# Patient Record
Sex: Female | Born: 1960 | Race: White | Hispanic: No | Marital: Married | Smoking: Current every day smoker
Health system: Southern US, Community
[De-identification: ages and names within clinical notes are randomized; demographics above are authoritative.]

## PROBLEM LIST (undated history)

## (undated) DIAGNOSIS — K219 Gastro-esophageal reflux disease without esophagitis: Secondary | ICD-10-CM

## (undated) DIAGNOSIS — R112 Nausea with vomiting, unspecified: Secondary | ICD-10-CM

## (undated) DIAGNOSIS — J439 Emphysema, unspecified: Secondary | ICD-10-CM

## (undated) DIAGNOSIS — M1990 Unspecified osteoarthritis, unspecified site: Secondary | ICD-10-CM

## (undated) DIAGNOSIS — K7460 Unspecified cirrhosis of liver: Secondary | ICD-10-CM

## (undated) DIAGNOSIS — G4730 Sleep apnea, unspecified: Secondary | ICD-10-CM

## (undated) DIAGNOSIS — K760 Fatty (change of) liver, not elsewhere classified: Secondary | ICD-10-CM

## (undated) DIAGNOSIS — J302 Other seasonal allergic rhinitis: Secondary | ICD-10-CM

## (undated) DIAGNOSIS — M4800 Spinal stenosis, site unspecified: Secondary | ICD-10-CM

## (undated) DIAGNOSIS — Z87442 Personal history of urinary calculi: Secondary | ICD-10-CM

## (undated) DIAGNOSIS — N9089 Other specified noninflammatory disorders of vulva and perineum: Secondary | ICD-10-CM

## (undated) DIAGNOSIS — K635 Polyp of colon: Secondary | ICD-10-CM

## (undated) DIAGNOSIS — M47812 Spondylosis without myelopathy or radiculopathy, cervical region: Secondary | ICD-10-CM

## (undated) DIAGNOSIS — R161 Splenomegaly, not elsewhere classified: Secondary | ICD-10-CM

## (undated) DIAGNOSIS — D696 Thrombocytopenia, unspecified: Secondary | ICD-10-CM

## (undated) DIAGNOSIS — F329 Major depressive disorder, single episode, unspecified: Secondary | ICD-10-CM

## (undated) DIAGNOSIS — Z9889 Other specified postprocedural states: Secondary | ICD-10-CM

## (undated) DIAGNOSIS — F419 Anxiety disorder, unspecified: Secondary | ICD-10-CM

## (undated) DIAGNOSIS — I2510 Atherosclerotic heart disease of native coronary artery without angina pectoris: Secondary | ICD-10-CM

## (undated) DIAGNOSIS — F32A Depression, unspecified: Secondary | ICD-10-CM

## (undated) HISTORY — PX: VULVAR LESION REMOVAL: SHX5391

## (undated) HISTORY — DX: Other seasonal allergic rhinitis: J30.2

## (undated) HISTORY — DX: Emphysema, unspecified: J43.9

## (undated) HISTORY — DX: Polyp of colon: K63.5

## (undated) HISTORY — DX: Thrombocytopenia, unspecified: D69.6

## (undated) HISTORY — DX: Unspecified cirrhosis of liver: K74.60

## (undated) HISTORY — DX: Sleep apnea, unspecified: G47.30

## (undated) HISTORY — DX: Splenomegaly, not elsewhere classified: R16.1

## (undated) HISTORY — DX: Atherosclerotic heart disease of native coronary artery without angina pectoris: I25.10

## (undated) HISTORY — DX: Depression, unspecified: F32.A

## (undated) HISTORY — DX: Spinal stenosis, site unspecified: M48.00

## (undated) HISTORY — DX: Unspecified osteoarthritis, unspecified site: M19.90

## (undated) HISTORY — DX: Major depressive disorder, single episode, unspecified: F32.9

## (undated) HISTORY — PX: SIMPLE VULVECTOMY: SUR1442

## (undated) HISTORY — PX: LAPAROSCOPIC CHOLECYSTECTOMY: SUR755

## (undated) HISTORY — DX: Gastro-esophageal reflux disease without esophagitis: K21.9

## (undated) HISTORY — PX: ABDOMINAL HYSTERECTOMY: SHX81

## (undated) HISTORY — DX: Fatty (change of) liver, not elsewhere classified: K76.0

## (undated) HISTORY — DX: Anxiety disorder, unspecified: F41.9

---

## 1989-04-21 HISTORY — PX: VAGINAL HYSTERECTOMY: SHX2639

## 1998-07-10 ENCOUNTER — Ambulatory Visit (HOSPITAL_BASED_OUTPATIENT_CLINIC_OR_DEPARTMENT_OTHER): Admission: RE | Admit: 1998-07-10 | Discharge: 1998-07-10 | Payer: Self-pay | Admitting: Orthopedic Surgery

## 1999-04-22 HISTORY — PX: FOOT SURGERY: SHX648

## 2007-04-22 HISTORY — PX: LEG SURGERY: SHX1003

## 2010-04-21 DIAGNOSIS — K635 Polyp of colon: Secondary | ICD-10-CM

## 2010-04-21 HISTORY — DX: Polyp of colon: K63.5

## 2010-04-21 HISTORY — PX: CHOLECYSTECTOMY: SHX55

## 2010-06-06 ENCOUNTER — Ambulatory Visit (INDEPENDENT_AMBULATORY_CARE_PROVIDER_SITE_OTHER): Payer: PRIVATE HEALTH INSURANCE | Admitting: Internal Medicine

## 2010-06-06 DIAGNOSIS — K5289 Other specified noninfective gastroenteritis and colitis: Secondary | ICD-10-CM

## 2011-02-10 ENCOUNTER — Telehealth (INDEPENDENT_AMBULATORY_CARE_PROVIDER_SITE_OTHER): Payer: Self-pay | Admitting: *Deleted

## 2011-02-10 MED ORDER — PANTOPRAZOLE SODIUM 40 MG PO TBEC: 40.0000 mg | DELAYED_RELEASE_TABLET | Freq: Every day | ORAL | Status: DC

## 2011-02-10 NOTE — Telephone Encounter (Signed)
Patient wants a written RX for her Protonix sent to [facility] pharmacy  Have any questions [phone] est [phone]

## 2011-09-20 HISTORY — PX: COLONOSCOPY W/ POLYPECTOMY: SHX1380

## 2013-03-21 HISTORY — PX: ESOPHAGOGASTRODUODENOSCOPY: SHX1529

## 2014-07-03 ENCOUNTER — Encounter (INDEPENDENT_AMBULATORY_CARE_PROVIDER_SITE_OTHER): Payer: Self-pay

## 2014-07-03 ENCOUNTER — Ambulatory Visit (INDEPENDENT_AMBULATORY_CARE_PROVIDER_SITE_OTHER): Payer: Self-pay | Admitting: Pulmonary Disease

## 2014-07-03 ENCOUNTER — Ambulatory Visit (INDEPENDENT_AMBULATORY_CARE_PROVIDER_SITE_OTHER)
Admission: RE | Admit: 2014-07-03 | Discharge: 2014-07-03 | Disposition: A | Discharge status: Home / Self Care | Payer: Self-pay | Source: Ambulatory Visit | Attending: Pulmonary Disease | Admitting: Pulmonary Disease

## 2014-07-03 ENCOUNTER — Encounter: Payer: Self-pay | Admitting: Pulmonary Disease

## 2014-07-03 VITALS — BP 98/64 | HR 88 | Temp 97.9°F | Ht 66.0 in | Wt 180.0 lb

## 2014-07-03 DIAGNOSIS — R05 Cough: Secondary | ICD-10-CM

## 2014-07-03 DIAGNOSIS — J432 Centrilobular emphysema: Secondary | ICD-10-CM

## 2014-07-03 DIAGNOSIS — Z720 Tobacco use: Secondary | ICD-10-CM

## 2014-07-03 DIAGNOSIS — R053 Chronic cough: Secondary | ICD-10-CM

## 2014-07-03 DIAGNOSIS — R058 Other specified cough: Secondary | ICD-10-CM

## 2014-07-03 MED ORDER — FLUTICASONE FUROATE-VILANTEROL 100-25 MCG/INH IN AEPB: 1.0000 | INHALATION_SPRAY | Freq: Every day | RESPIRATORY_TRACT | Status: DC

## 2014-07-03 MED ORDER — CHLORPHENIRAMINE MALEATE 4 MG PO TABS: 4.0000 mg | ORAL_TABLET | Freq: Every day | ORAL | Status: DC

## 2014-07-03 MED ORDER — UMECLIDINIUM BROMIDE 62.5 MCG/INH IN AEPB: 1.0000 | INHALATION_SPRAY | Freq: Every day | RESPIRATORY_TRACT | Status: DC

## 2014-07-03 NOTE — Patient Instructions (Signed)
Chest xray today Will schedule breathing test (PFT) Incruse one puff daily Use saline nasal spray daily Flonase two sprays each nostril daily Chlorpheneramine 4 mg nightly for allergies Sip water when you have urge to cough Salt water gargle twice per day Continue breo and singulair Follow up in 8 weeks

## 2014-07-03 NOTE — Progress Notes (Signed)
Chief Complaint  Patient presents with  . PULMONARY COINSULT    Referred by Dr Woody Seller for COPD. PFT in [place], [place] 2014/2015.     History of Present Illness: Destiny Villanueva is a 54 y.o. female former smoker evaluation of chronic cough with near syncope.  She has extensive hx of smoking.  She smoked 1 ppd and quit 2 weeks ago >> she has been using nicorette gum.  She has hx of allergies, and was seen by Dr. Donneta Romberg.  She was told she is allergic to just about everything.  She was never on allergy shots.  She is allergic to cats >> she has a International aid/development worker.  There is no hx of TB or PNA.    She used to work as a Scientist, water quality at [employer].  She had PFT's done about a year ago and was told she had COPD.  She was seen by Dr. Koleen Nimrod with pulmonary 2 yrs ago for lung nodule >> told it was benign.  Previous CT chest reports changes of emphysema.  She gets coughing spells >> usually non productive.  She will sometimes bring up clear to yellow sputum.  She denies hemoptysis or wheezing.  She gets sinus congestion, sniffles, post nasal drip, occasional sore throat, and globus sensation.  She has flonase, but has not been using it.  She takes nexium daily >> no improvement with cough.  She has been using Breo >> this keeps her lungs open longer than symbicort, but makes he cough more.  She has been using singulair for about a year.  She has ventolin >> helps some.  She will sometimes feel like she is going to pass out if she coughs too much.  Tests: CT chest 07/08/11 >> mild centrilobular emphysema, 2 mm calcified RUL nodule no change from 2010, hepatic steatosis  Destiny Villanueva  has a past medical history of Emphysema/COPD; Seasonal allergies; and Sleep apnea.  Destiny Villanueva  has past surgical history that includes Vaginal hysterectomy (1991); Cholecystectomy (2012); Foot surgery (Left, 2001); and Leg Surgery (Left, 2009).  Prior to Admission medications   Medication Sig Start Date End Date  Taking? Authorizing Provider  pantoprazole (PROTONIX) 40 MG tablet Take 1 tablet (40 mg total) by mouth daily. 02/10/11 02/10/12  Butch Penny, NP    Allergies  Allergen Reactions  . Aspirin     Stomach pains, nausea and vomiting  . Ciprofloxacin     Blisters on tongue  . Penicillins     Hives    Her family history includes Asthma in her brother and sister; Breast cancer in her cousin and maternal aunt; Emphysema in her mother and sister; Heart disease in her maternal grandmother; Lung cancer in her mother.  She  reports that she quit smoking about 2 weeks ago. Her smoking use included Cigarettes. She has a 30 pack-year smoking history. She does not have any smokeless tobacco history on file. She reports that she does not drink alcohol or use illicit drugs.  Review of Systems  Constitutional: Negative for fever and unexpected weight change.  HENT: Positive for postnasal drip and sore throat. Negative for congestion, dental problem, ear pain, nosebleeds, rhinorrhea, sinus pressure, sneezing and trouble swallowing.   Eyes: Negative for redness and itching.  Respiratory: Positive for cough and shortness of breath. Negative for chest tightness and wheezing.   Cardiovascular: Negative for palpitations and leg swelling.  Gastrointestinal: Negative for nausea and vomiting.       Acid heartburn //  Indigestion  Genitourinary: Negative for dysuria.  Musculoskeletal: Positive for arthralgias. Negative for joint swelling.  Skin: Negative for rash.  Neurological: Negative for headaches.  Hematological: Does not bruise/bleed easily.  Psychiatric/Behavioral: Positive for dysphoric mood. The patient is nervous/anxious.    Physical Exam: Blood pressure 98/64, pulse 88, temperature 97.9 F (36.6 C), temperature source Oral, height 5\' 6"  (1.676 m), weight 180 lb (81.647 kg), SpO2 97 %. Body mass index is 29.07 kg/(m^2).  General - No distress ENT - No sinus tenderness, no oral exudate, no LAN,  no thyromegaly, TM clear, pupils equal/reactive, edentulous Cardiac - s1s2 regular, no murmur, pulses symmetric Chest - No wheeze/rales/dullness, good air entry, normal respiratory excursion Back - No focal tenderness Abd - Soft, non-tender, no organomegaly, + bowel sounds Ext - No edema Neuro - Normal strength, cranial nerves intact Skin - No rashes Psych - Normal mood, and behavior  Discussion: She has chronic cough with near syncope.  She has hx of allergic rhinitis with post-nasal drip, COPD with emphysema, and GERD.  I suspect her persistent cough is related to all these issues.   Assessment/Plan:  Upper airway cough syndrome with allergic rhinitis. Plan: - will have her use nasal irrigation and flonase - will have her use chlorpheniramine nightly - continue singulair - advise her to avoid forcing a cough, sip water when she has urge to cough, and use salt water gargles - she might need additional allergy testing  COPD with emphysema. Plan: - will repeat chest xray and PFT - will add incruse one puff daily - she can continue breo, singulair, and prn ventolin   GERD. Plan: - continue nexium per her PCP  Tobacco abuse. Plan: - encourage her to continue with her smoking cessation   Chesley Mires, MD [place] [facility]/Critical Care/Sleep Pager:  [phone]

## 2014-07-03 NOTE — Progress Notes (Deleted)
   Subjective:    Patient ID: Destiny Villanueva, female    DOB: Dec 06, 1960, 54 y.o.   MRN: [id number]  HPI    Review of Systems  Constitutional: Negative for fever and unexpected weight change.  HENT: Positive for postnasal drip and sore throat. Negative for congestion, dental problem, ear pain, nosebleeds, rhinorrhea, sinus pressure, sneezing and trouble swallowing.   Eyes: Negative for redness and itching.  Respiratory: Positive for cough and shortness of breath. Negative for chest tightness and wheezing.   Cardiovascular: Negative for palpitations and leg swelling.  Gastrointestinal: Negative for nausea and vomiting.       Acid heartburn // Indigestion  Genitourinary: Negative for dysuria.  Musculoskeletal: Positive for arthralgias. Negative for joint swelling.  Skin: Negative for rash.  Neurological: Negative for headaches.  Hematological: Does not bruise/bleed easily.  Psychiatric/Behavioral: Positive for dysphoric mood. The patient is nervous/anxious.        Objective:   Physical Exam        Assessment & Plan:

## 2014-07-03 NOTE — Addendum Note (Signed)
Addended by: Virl Cagey on: 07/03/2014 05:05 PM   Modules accepted: Orders

## 2014-07-04 ENCOUNTER — Telehealth: Payer: Self-pay | Admitting: Pulmonary Disease

## 2014-07-04 NOTE — Telephone Encounter (Signed)
Dg Chest 2 View  07/03/2014   CLINICAL DATA:  Chronic cough and congestion for 1 year  EXAM: CHEST  2 VIEW  COMPARISON:  None.  FINDINGS: Cardiac shadow is within normal limits. Platelike scarring is noted in the left lung base laterally. No focal infiltrate or sizable effusion is seen. No acute bony abnormality is noted.  IMPRESSION: No active cardiopulmonary disease.   Electronically Signed   By: Inez Catalina M.D.   On: 07/03/2014 15:13    Will have my nurse inform pt that CXR was normal.  No change to current treatment plan.

## 2014-07-05 NOTE — Telephone Encounter (Signed)
lmtcb x1 

## 2014-07-06 NOTE — Telephone Encounter (Signed)
Pt informed of cxr results per Dr Halford Chessman

## 2014-08-28 ENCOUNTER — Encounter: Payer: Self-pay | Admitting: Pulmonary Disease

## 2014-08-28 ENCOUNTER — Ambulatory Visit (INDEPENDENT_AMBULATORY_CARE_PROVIDER_SITE_OTHER): Payer: Self-pay | Admitting: Pulmonary Disease

## 2014-08-28 VITALS — BP 104/68 | HR 74 | Ht 66.0 in | Wt 182.0 lb

## 2014-08-28 DIAGNOSIS — J432 Centrilobular emphysema: Secondary | ICD-10-CM

## 2014-08-28 DIAGNOSIS — Z720 Tobacco use: Secondary | ICD-10-CM

## 2014-08-28 DIAGNOSIS — R053 Chronic cough: Secondary | ICD-10-CM

## 2014-08-28 DIAGNOSIS — R05 Cough: Secondary | ICD-10-CM

## 2014-08-28 DIAGNOSIS — R058 Other specified cough: Secondary | ICD-10-CM

## 2014-08-28 LAB — PULMONARY FUNCTION TEST
DL/VA % pred: 77 %
DL/VA: 3.89 ml/min/mmHg/L
DLCO UNC % PRED: 78 %
DLCO unc: 21.06 ml/min/mmHg
FEF 25-75 PRE: 1.78 L/s
FEF 25-75 Post: 2.21 L/sec
FEF2575-%Change-Post: 24 %
FEF2575-%PRED-POST: 81 %
FEF2575-%Pred-Pre: 65 %
FEV1-%CHANGE-POST: 5 %
FEV1-%PRED-POST: 86 %
FEV1-%Pred-Pre: 81 %
FEV1-Post: 2.52 L
FEV1-Pre: 2.37 L
FEV1FVC-%Change-Post: 8 %
FEV1FVC-%PRED-PRE: 92 %
FEV6-%Change-Post: -1 %
FEV6-%PRED-POST: 88 %
FEV6-%Pred-Pre: 89 %
FEV6-PRE: 3.22 L
FEV6-Post: 3.18 L
FEV6FVC-%Change-Post: 1 %
FEV6FVC-%PRED-PRE: 101 %
FEV6FVC-%Pred-Post: 103 %
FVC-%Change-Post: -2 %
FVC-%PRED-PRE: 88 %
FVC-%Pred-Post: 85 %
FVC-POST: 3.18 L
FVC-Pre: 3.26 L
POST FEV6/FVC RATIO: 100 %
PRE FEV6/FVC RATIO: 99 %
Post FEV1/FVC ratio: 79 %
Pre FEV1/FVC ratio: 73 %
RV % PRED: 103 %
RV: 2.05 L
TLC % PRED: 101 %
TLC: 5.44 L

## 2014-08-28 MED ORDER — UMECLIDINIUM BROMIDE 62.5 MCG/INH IN AEPB: 1.0000 | INHALATION_SPRAY | Freq: Every day | RESPIRATORY_TRACT | Status: DC

## 2014-08-28 MED ORDER — DIPHENHYDRAMINE HCL 50 MG PO TABS: 50.0000 mg | ORAL_TABLET | Freq: Every day | ORAL | Status: DC

## 2014-08-28 NOTE — Patient Instructions (Signed)
Continue incruse one puff daily Continue flonase two sprays each nostril daily Try benadryl 50 mg nightly Follow up in 2 months

## 2014-08-28 NOTE — Progress Notes (Signed)
Chief Complaint  Patient presents with  . Follow-up    Pt c/o cough- no improvement. Pt states that she becomes very shakey when she coughs and this is getting worse over time. Review PFT today.     History of Present Illness: Destiny Villanueva is a 54 y.o. female former smoker with chronic cough and cough syncope.  She had CXR on 07/03/14 which was unremarkable.  She had PFT earlier today.  This showed mild diffusion defect, but otherwise normal.  Her cough persists.  She had trouble affording medications.  She is no longer taking singulair, nexium, or chlorpheniramine.    She has noticed getting tremors after using breo.  She does not feel this helps.  Her cough has not gotten any worse since stopping nexium.  She still has sinus congestion and post nasal drip.  She has been using flonase.  She does not usually cough up sputum.  She denies chest pain.   TESTS: CT chest 07/08/11 >> mild centrilobular emphysema, 2 mm calcified RUL nodule no change from 2010, hepatic steatosis PFT 08/28/14 >> FEV1 2.52 (86%), FEV1% 79, TLC 5.44 (101%), DLCO 78%, no BD  Past medical hx, Past surgical hx, Medications, Allergies, Family hx, Social hx all reviewed.   Physical Exam: Blood pressure 104/68, pulse 74, height 5\' 6"  (1.676 m), weight 182 lb (82.555 kg), SpO2 97 %. Body mass index is 29.39 kg/(m^2).  General - No distress ENT - No sinus tenderness, no oral exudate, no LAN Cardiac - s1s2 regular, no murmur Chest - No wheeze/rales/dullness Back - No focal tenderness Abd - Soft, non-tender Ext - No edema Neuro - Normal strength Skin - No rashes Psych - normal mood, and behavior   Assessment/Plan:  Upper airway cough syndrome with allergic rhinitis. She has trouble affording medications. Plan: - will have her continue flonase and nasal irrigation - she can try using benadryl 50 mg nightly - defer further testing for now due to her concern about expense  Emphysema. She has mild  diffusion defect, but no evidence for airflow obstruction.   Plan: - continue incruse - she likely has tremor from [facility] >> will have her stop this - if her symptoms persist, then she might need repeat CT chest >> defer this for now due to patient concern about expense of therapy  GERD. Plan: - monitor off nexium  Tobacco abuse. Plan: - encouraged her to continue with her smoking abstinence    Chesley Mires, MD [place] [facility]/Critical Care/Sleep Pager:  [phone]

## 2014-08-28 NOTE — Addendum Note (Signed)
Addended by: Virl Cagey on: 08/28/2014 04:23 PM   Modules accepted: Orders

## 2014-08-28 NOTE — Progress Notes (Signed)
PFT done today. 

## 2014-10-30 ENCOUNTER — Ambulatory Visit: Payer: Self-pay | Admitting: Pulmonary Disease

## 2014-12-06 ENCOUNTER — Encounter: Payer: Self-pay | Admitting: Pulmonary Disease

## 2014-12-06 ENCOUNTER — Ambulatory Visit (INDEPENDENT_AMBULATORY_CARE_PROVIDER_SITE_OTHER): Payer: Self-pay | Admitting: Pulmonary Disease

## 2014-12-06 VITALS — BP 108/58 | HR 92 | Ht 66.0 in | Wt 177.0 lb

## 2014-12-06 DIAGNOSIS — R058 Other specified cough: Secondary | ICD-10-CM

## 2014-12-06 DIAGNOSIS — R05 Cough: Secondary | ICD-10-CM

## 2014-12-06 DIAGNOSIS — J439 Emphysema, unspecified: Secondary | ICD-10-CM

## 2014-12-06 DIAGNOSIS — R053 Chronic cough: Secondary | ICD-10-CM

## 2014-12-06 MED ORDER — FLUTICASONE FUROATE-VILANTEROL 100-25 MCG/INH IN AEPB: 1.0000 | INHALATION_SPRAY | Freq: Every day | RESPIRATORY_TRACT | Status: DC

## 2014-12-06 MED ORDER — ALBUTEROL SULFATE (2.5 MG/3ML) 0.083% IN NEBU
2.5000 mg | INHALATION_SOLUTION | Freq: Four times a day (QID) | RESPIRATORY_TRACT | Status: DC | PRN
Start: 2014-12-06 — End: 2021-04-18

## 2014-12-06 NOTE — Patient Instructions (Signed)
Stop incruse Start Breo one puff daily >> rinse mouth after each use Can use albuterol by puffer or nebulizer every 4 to 6 hours as needed for cough, wheeze, or chest congestion Will try to arrange for home nebulizer machine  Follow up in 6 months

## 2014-12-06 NOTE — Progress Notes (Signed)
Chief Complaint  Patient presents with  . Follow-up    Pt reports that her syncope episodes with cough are worsening since last OV - no improvement. Still using Incruse.     History of Present Illness: Destiny Villanueva is a 54 y.o. female former smoker with chronic cough and cough syncope.  She continues to get coughing spells.  These happen randomly.  No specific inciting events.  She feels like her throat closes and her head spins.  She will then either almost pass out, or actually pass out.  She comes to quickly.   She has been using incruse.  She feels that breo opened her up more.  She is still having tremors even w/o using breo.  She is not using albuterol.  She has been using flonase and benadryl.  Her sinuses are okay.  She denies reflux.   TESTS: CT chest 07/08/11 >> mild centrilobular emphysema, 2 mm calcified RUL nodule no change from 2010, hepatic steatosis PFT 08/28/14 >> FEV1 2.52 (86%), FEV1% 79, TLC 5.44 (101%), DLCO 78%, no BD  Past medical hx >> Allergies, OSA  Past surgical hx, Medications, Allergies, Family hx, Social hx all reviewed.   Physical Exam: BP 108/58 mmHg  Pulse 92  Ht 5\' 6"  (1.676 m)  Wt 177 lb (80.287 kg)  BMI 28.58 kg/m2  SpO2 98%  General - No distress ENT - No sinus tenderness, no oral exudate, no LAN Cardiac - s1s2 regular, no murmur Chest - No wheeze/rales/dullness Back - No focal tenderness Abd - Soft, non-tender Ext - No edema Neuro - Normal strength Skin - No rashes Psych - normal mood, and behavior   Assessment/Plan:  Upper airway cough syndrome with allergic rhinitis. She has trouble affording medications. Plan: - will have her continue flonase and nasal irrigation - continue benadryl 50 mg nightly prn - defer further testing for now due to her concern about expense  Emphysema. She has mild diffusion defect, but no evidence for airflow obstruction.   Plan: - she felt breo worked better than incruse >> change back to  breo - continue prn albuterol >> will try to set up home nebulizer  GERD. Plan: - continue monitor off nexium   Destiny Mires, MD  Pulmonary/Critical Care/Sleep Pager:  [phone]

## 2015-01-29 ENCOUNTER — Telehealth: Payer: Self-pay | Admitting: Pulmonary Disease

## 2015-01-29 NOTE — Telephone Encounter (Signed)
Called and spoke with Tamela Oddi at [employer] stated they were processing pt's claim for disability and wanted to know if Dr Halford Chessman had placed any work limitations on pt Informed Doris that I would send message to Dr Halford Chessman to find out  Dr Halford Chessman, please advise if pt has any work limitations. Thank you

## 2015-01-30 NOTE — Telephone Encounter (Signed)
Dr. Sood, please advise. 

## 2015-01-30 NOTE — Telephone Encounter (Signed)
Spoke with Tamela Oddi at [facility], aware of below.  Nothing further needed.

## 2015-01-30 NOTE — Telephone Encounter (Signed)
I did not discuss any work restrictions during her last visit with me from 12/06/14.

## 2015-01-31 ENCOUNTER — Encounter: Payer: Self-pay | Admitting: Pulmonary Disease

## 2015-01-31 NOTE — Telephone Encounter (Signed)
  MyChart Message:  I am trying to get approved for my long term disability from my former employer and I need for Dr. Halford Chessman to return the paper work from them in reference to me not being able to work anymore due to my COPD with cough syncopy. They do not seem to understand exactly what this condition does to me physically almost every time I cough. I almost always black out when this happens and start shaking extremely bad. It takes me a while to recover from these episodes and I do have to go lay down until it passes. If he has any questions at all please have him or his nurse call me at home [phone]. The paperwork for my long term disability is from [place]. They have already gotten my medical records from your office.  Thank You, Destiny Villanueva

## 2015-02-01 NOTE — Telephone Encounter (Signed)
Dr. Halford Chessman, do you by chance have these documents?  Thank you.

## 2015-02-05 NOTE — Telephone Encounter (Addendum)
10.15.16 mychart message from pt: Message     Would you please send me a copy of the document in reference to me being unable to continue working? I need to forward it to my attorney. I do not need the medical records.    Thank You,    Destiny Villanueva    Nothing recent scanned into pt's chart Called and LM for Elmyra Ricks with [employer] to see if something recent had been sent regarding this for pt Email sent to patient informing her we are working on her message

## 2015-02-15 ENCOUNTER — Encounter: Payer: Self-pay | Admitting: Pulmonary Disease

## 2015-02-20 ENCOUNTER — Telehealth: Payer: Self-pay | Admitting: Pulmonary Disease

## 2015-02-20 NOTE — Telephone Encounter (Signed)
Received MyChart Message from patient requesting information about [employer] Disability paperwork.  Per Dr. Juanetta Gosling last [place] note, patient was not given any restrictions.  Paperwork was scanned into chart blank.  Printed out forms and asked Dr. Halford Chessman about the forms, he said to give them to him and he will review them again.  Papers left at Dr. Juanetta Gosling workstation for review.

## 2015-03-05 NOTE — Telephone Encounter (Signed)
I have completed forms

## 2015-03-05 NOTE — Telephone Encounter (Signed)
Forms faxed to [facility] along with copies of OV notes for patient's disability claim Patient notified via mychart Nothing further needed. Closing encounter

## 2015-03-27 ENCOUNTER — Telehealth: Payer: Self-pay | Admitting: Pulmonary Disease

## 2015-03-27 NOTE — Telephone Encounter (Signed)
lmtcb X1 at below extension. Forms have been received and are in VS' look-at

## 2015-03-27 NOTE — Telephone Encounter (Signed)
Returned call let her know that forms received and given to VS to look at, was glad they were received.Hillery Hunter

## 2015-04-18 ENCOUNTER — Encounter: Payer: Self-pay | Admitting: Physician Assistant

## 2015-04-18 ENCOUNTER — Ambulatory Visit: Payer: Self-pay | Admitting: Physician Assistant

## 2015-04-18 VITALS — BP 100/64 | HR 89 | Temp 97.9°F | Ht 65.5 in | Wt 164.6 lb

## 2015-04-18 DIAGNOSIS — F419 Anxiety disorder, unspecified: Secondary | ICD-10-CM | POA: Insufficient documentation

## 2015-04-18 DIAGNOSIS — F329 Major depressive disorder, single episode, unspecified: Secondary | ICD-10-CM | POA: Insufficient documentation

## 2015-04-18 DIAGNOSIS — F17219 Nicotine dependence, cigarettes, with unspecified nicotine-induced disorders: Secondary | ICD-10-CM | POA: Insufficient documentation

## 2015-04-18 DIAGNOSIS — G8929 Other chronic pain: Secondary | ICD-10-CM

## 2015-04-18 DIAGNOSIS — F32A Depression, unspecified: Secondary | ICD-10-CM | POA: Insufficient documentation

## 2015-04-18 DIAGNOSIS — E559 Vitamin D deficiency, unspecified: Secondary | ICD-10-CM

## 2015-04-18 DIAGNOSIS — J438 Other emphysema: Secondary | ICD-10-CM

## 2015-04-18 DIAGNOSIS — J449 Chronic obstructive pulmonary disease, unspecified: Secondary | ICD-10-CM | POA: Insufficient documentation

## 2015-04-18 DIAGNOSIS — Z1329 Encounter for screening for other suspected endocrine disorder: Secondary | ICD-10-CM

## 2015-04-18 DIAGNOSIS — Z13220 Encounter for screening for lipoid disorders: Secondary | ICD-10-CM

## 2015-04-18 DIAGNOSIS — Z131 Encounter for screening for diabetes mellitus: Secondary | ICD-10-CM

## 2015-04-18 LAB — CBC WITH DIFFERENTIAL/PLATELET
Basophils Absolute: 0 10*3/uL (ref 0.0–0.1)
Basophils Relative: 1 % (ref 0–1)
EOS PCT: 2 % (ref 0–5)
Eosinophils Absolute: 0.1 10*3/uL (ref 0.0–0.7)
HEMATOCRIT: 39.2 % (ref 36.0–46.0)
Hemoglobin: 13.2 g/dL (ref 12.0–15.0)
LYMPHS ABS: 1.7 10*3/uL (ref 0.7–4.0)
LYMPHS PCT: 35 % (ref 12–46)
MCH: 28.4 pg (ref 26.0–34.0)
MCHC: 33.7 g/dL (ref 30.0–36.0)
MCV: 84.3 fL (ref 78.0–100.0)
MONOS PCT: 9 % (ref 3–12)
MPV: 10.3 fL (ref 8.6–12.4)
Monocytes Absolute: 0.4 10*3/uL (ref 0.1–1.0)
Neutro Abs: 2.6 10*3/uL (ref 1.7–7.7)
Neutrophils Relative %: 53 % (ref 43–77)
Platelets: 127 10*3/uL — ABNORMAL LOW (ref 150–400)
RBC: 4.65 MIL/uL (ref 3.87–5.11)
RDW: 14 % (ref 11.5–15.5)
WBC: 4.9 10*3/uL (ref 4.0–10.5)

## 2015-04-18 LAB — LIPID PANEL
CHOL/HDL RATIO: 4.3 ratio (ref <=5.0)
Cholesterol: 125 mg/dL (ref 125–200)
HDL: 29 mg/dL — AB (ref >=46)
LDL Cholesterol: 65 mg/dL (ref <130)
Triglycerides: 153 mg/dL — ABNORMAL HIGH (ref <150)
VLDL: 31 mg/dL — ABNORMAL HIGH (ref <30)

## 2015-04-18 LAB — COMPLETE METABOLIC PANEL WITH GFR
ALK PHOS: 53 U/L (ref 33–130)
ALT: 22 U/L (ref 6–29)
AST: 35 U/L (ref 10–35)
Albumin: 3.9 g/dL (ref 3.6–5.1)
BUN: 12 mg/dL (ref 7–25)
CALCIUM: 9.1 mg/dL (ref 8.6–10.4)
CO2: 25 mmol/L (ref 20–31)
Chloride: 105 mmol/L (ref 98–110)
Creat: 0.69 mg/dL (ref 0.50–1.05)
GFR, Est Non African American: >89 mL/min (ref >=60)
Glucose, Bld: 89 mg/dL (ref 65–99)
POTASSIUM: 4.4 mmol/L (ref 3.5–5.3)
Sodium: 142 mmol/L (ref 135–146)
Total Bilirubin: 0.5 mg/dL (ref 0.2–1.2)
Total Protein: 6.4 g/dL (ref 6.1–8.1)

## 2015-04-18 LAB — HEMOGLOBIN A1C
HEMOGLOBIN A1C: 5.5 % (ref <5.7)
MEAN PLASMA GLUCOSE: 111 mg/dL (ref <117)

## 2015-04-18 LAB — TSH: TSH: 2.009 u[IU]/mL (ref 0.350–4.500)

## 2015-04-18 LAB — GLUCOSE, POCT (MANUAL RESULT ENTRY): POC GLUCOSE: 96 mg/dL (ref 70–99)

## 2015-04-18 MED ORDER — ALBUTEROL SULFATE HFA 108 (90 BASE) MCG/ACT IN AERS: 2.0000 | INHALATION_SPRAY | Freq: Four times a day (QID) | RESPIRATORY_TRACT | Status: AC | PRN

## 2015-04-18 MED ORDER — MOMETASONE FUROATE 50 MCG/ACT NA SUSP: NASAL | Status: DC

## 2015-04-18 NOTE — Patient Instructions (Signed)
Smoking Cessation, Tips for Success If you are ready to quit smoking, congratulations! You have chosen to help yourself be healthier. Cigarettes bring nicotine, tar, carbon monoxide, and other irritants into your body. Your lungs, heart, and blood vessels will be able to work better without these poisons. There are many different ways to quit smoking. Nicotine gum, nicotine patches, a nicotine inhaler, or nicotine nasal spray can help with physical craving. Hypnosis, support groups, and medicines help break the habit of smoking. WHAT THINGS CAN I DO TO MAKE QUITTING EASIER?  Here are some tips to help you quit for good:  Pick a date when you will quit smoking completely. Tell all of your friends and family about your plan to quit on that date.  Do not try to slowly cut down on the number of cigarettes you are smoking. Pick a quit date and quit smoking completely starting on that day.  Throw away all cigarettes.   Clean and remove all ashtrays from your home, work, and car.  On a card, write down your reasons for quitting. Carry the card with you and read it when you get the urge to smoke.  Cleanse your body of nicotine. Drink enough water and fluids to keep your urine clear or pale yellow. Do this after quitting to flush the nicotine from your body.  Learn to predict your moods. Do not let a bad situation be your excuse to have a cigarette. Some situations in your life might tempt you into wanting a cigarette.  Never have "just one" cigarette. It leads to wanting another and another. Remind yourself of your decision to quit.  Change habits associated with smoking. If you smoked while driving or when feeling stressed, try other activities to replace smoking. Stand up when drinking your coffee. Brush your teeth after eating. Sit in a different chair when you read the paper. Avoid alcohol while trying to quit, and try to drink fewer caffeinated beverages. Alcohol and caffeine may urge you to  smoke.  Avoid foods and drinks that can trigger a desire to smoke, such as sugary or spicy foods and alcohol.  Ask people who smoke not to smoke around you.  Have something planned to do right after eating or having a cup of coffee. For example, plan to take a walk or exercise.  Try a relaxation exercise to calm you down and decrease your stress. Remember, you may be tense and nervous for the first 2 weeks after you quit, but this will pass.  Find new activities to keep your hands busy. Play with a pen, coin, or rubber band. Doodle or draw things on paper.  Brush your teeth right after eating. This will help cut down on the craving for the taste of tobacco after meals. You can also try mouthwash.   Use oral substitutes in place of cigarettes. Try using lemon drops, carrots, cinnamon sticks, or chewing gum. Keep them handy so they are available when you have the urge to smoke.  When you have the urge to smoke, try deep breathing.  Designate your home as a nonsmoking area.  If you are a heavy smoker, ask your health care provider about a prescription for nicotine chewing gum. It can ease your withdrawal from nicotine.  Reward yourself. Set aside the cigarette money you save and buy yourself something nice.  Look for support from others. Join a support group or smoking cessation program. Ask someone at home or at work to help you with your plan   to quit smoking.  Always ask yourself, "Do I need this cigarette or is this just a reflex?" Tell yourself, "Today, I choose not to smoke," or "I do not want to smoke." You are reminding yourself of your decision to quit.  Do not replace cigarette smoking with electronic cigarettes (commonly called e-cigarettes). The safety of e-cigarettes is unknown, and some may contain harmful chemicals.  If you relapse, do not give up! Plan ahead and think about what you will do the next time you get the urge to smoke. HOW WILL I FEEL WHEN I QUIT SMOKING? You  may have symptoms of withdrawal because your body is used to nicotine (the addictive substance in cigarettes). You may crave cigarettes, be irritable, feel very hungry, cough often, get headaches, or have difficulty concentrating. The withdrawal symptoms are only temporary. They are strongest when you first quit but will go away within 10-14 days. When withdrawal symptoms occur, stay in control. Think about your reasons for quitting. Remind yourself that these are signs that your body is healing and getting used to being without cigarettes. Remember that withdrawal symptoms are easier to treat than the major diseases that smoking can cause.  Even after the withdrawal is over, expect periodic urges to smoke. However, these cravings are generally short lived and will go away whether you smoke or not. Do not smoke! WHAT RESOURCES ARE AVAILABLE TO HELP ME QUIT SMOKING? Your health care provider can direct you to community resources or hospitals for support, which may include:  Group support.  Education.  Hypnosis.  Therapy.   This information is not intended to replace advice given to you by your health care provider. Make sure you discuss any questions you have with your health care provider.   Document Released: 01/04/2004 Document Revised: 04/28/2014 Document Reviewed: 09/23/2012 Elsevier Interactive Patient Education 2016 [employer]  

## 2015-04-18 NOTE — Progress Notes (Signed)
BP 100/64 mmHg  Pulse 89  Temp(Src) 97.9 F (36.6 C)  Ht 5' 5.5" (1.664 m)  Wt 164 lb 9.6 oz (74.662 kg)  BMI 26.96 kg/m2  SpO2 99%   Subjective:    Patient ID: Destiny Villanueva, female    DOB: [birth year], 54 y.o.   MRN: [id number]  HPI: Destiny Villanueva is a 54 y.o. female presenting on 04/18/2015 for New Patient (Initial Visit)   HPI   Pt states she had insurance until February this year.  While insured, she was treated at [facility].  Pt says she has f/u appt with pulmonologist dr Halford Chessman for her emphysema.  She says they give her enough samples of her Memory [facility] that she doesn't have to buy it.    Pt says she started smoking again about a month ago.  She says the bad episodes of coughing with near syncope never stopped even for the time when she had stopped smoking earlier in the year.    Pt isn't taking her vitamin d and flonase b/c she can't afford them.    Pt was going to Dr Francesco Runner for chronic pain at [facility] pain clinic. She says she has enough of her baclofen and norco left from when she last saw him in February.  Pt says she was due for repeat colonscopy at age 7 but says she just forgot about it.  Dr [name] in [place] did her first procedure.  Pt has card to call for [facility] appointment.    Pt states had mammo 04/10/15 at [facility].  Relevant past medical, surgical, family and social history reviewed and updated as indicated. Interim medical history since our last visit reviewed. Allergies and medications reviewed and updated.  Current outpatient prescriptions:  .  albuterol (PROVENTIL HFA;VENTOLIN HFA) 108 (90 BASE) MCG/ACT inhaler, Inhale 2 puffs into the lungs every 6 (six) hours as needed for wheezing or shortness of breath., Disp: , Rfl:  .  albuterol (PROVENTIL) (2.5 MG/3ML) 0.083% nebulizer solution, Take 3 mLs (2.5 mg total) by nebulization every 6 (six) hours as needed for wheezing or shortness of breath., Disp: 75 mL, Rfl: 12 .  ALPRAZolam (XANAX) 0.5 MG tablet,  Take 0.5 mg by mouth at bedtime as needed for anxiety., Disp: , Rfl:  .  baclofen (LIORESAL) 10 MG tablet, Take 10 mg by mouth 3 (three) times daily as needed for muscle spasms., Disp: , Rfl:  .  diphenhydrAMINE (BENADRYL) 50 MG tablet, Take 1 tablet (50 mg total) by mouth at bedtime., Disp: , Rfl:  .  escitalopram (LEXAPRO) 20 MG tablet, Take 20 mg by mouth daily., Disp: , Rfl:  .  Fluticasone Furoate-Vilanterol (BREO ELLIPTA) 100-25 MCG/INH AEPB, Inhale 1 puff into the lungs daily., Disp: 1 each, Rfl: 5 .  HYDROcodone-acetaminophen (NORCO) 10-325 MG per tablet, Take 1 tablet by mouth as needed., Disp: , Rfl:  .  Cholecalciferol (VITAMIN D3) 50000 UNITS CAPS, Take 1 capsule by mouth once a week. Reported on 04/18/2015, Disp: , Rfl:  .  fluticasone (FLONASE) 50 MCG/ACT nasal spray, Place 2 sprays into both nostrils daily. Reported on 04/18/2015, Disp: , Rfl:   Review of Systems  Constitutional: Positive for chills, diaphoresis, fatigue and unexpected weight change. Negative for fever and appetite change.  HENT: Positive for congestion, sneezing, sore throat and voice change. Negative for dental problem, drooling, ear pain, facial swelling, hearing loss, mouth sores and trouble swallowing.   Eyes: Negative for pain, discharge, redness, itching and visual disturbance.  Respiratory: Positive for cough and shortness of breath. Negative for choking and wheezing.   Cardiovascular: Negative for chest pain, palpitations and leg swelling.  Gastrointestinal: Positive for abdominal pain, diarrhea and constipation. Negative for vomiting and blood in stool.  Endocrine: Negative for cold intolerance, heat intolerance and polydipsia.  Genitourinary: Negative for dysuria, hematuria and decreased urine volume.  Musculoskeletal: Positive for back pain, arthralgias and gait problem.  Skin: Negative for rash.  Allergic/Immunologic: Positive for environmental allergies.  Neurological: Positive for light-headedness  and headaches. Negative for seizures and syncope.  Hematological: Negative for adenopathy.  Psychiatric/Behavioral: Positive for dysphoric mood and agitation. Negative for suicidal ideas. The patient is nervous/anxious.     Per HPI unless specifically indicated above     Objective:    BP 100/64 mmHg  Pulse 89  Temp(Src) 97.9 F (36.6 C)  Ht 5' 5.5" (1.664 m)  Wt 164 lb 9.6 oz (74.662 kg)  BMI 26.96 kg/m2  SpO2 99%  Wt Readings from Last 3 Encounters:  04/18/15 164 lb 9.6 oz (74.662 kg)  12/06/14 177 lb (80.287 kg)  08/28/14 182 lb (82.555 kg)    Physical Exam  Constitutional: She is oriented to person, place, and time. She appears well-developed and well-nourished.  HENT:  Head: Normocephalic and atraumatic.  Mouth/Throat: Oropharynx is clear and moist. No oropharyngeal exudate.  edentulous  Eyes: Conjunctivae and EOM are normal. Pupils are equal, round, and reactive to light.  Neck: Neck supple. No thyromegaly present.  Cardiovascular: Normal rate and regular rhythm.   Pulmonary/Chest: Effort normal and breath sounds normal.  Abdominal: Soft. Bowel sounds are normal. She exhibits no distension and no mass. There is no hepatosplenomegaly. There is tenderness. There is no rebound, no guarding and no CVA tenderness.  Musculoskeletal: She exhibits no edema.  Lymphadenopathy:    She has no cervical adenopathy.  Neurological: She is alert and oriented to person, place, and time. Gait normal.  Skin: Skin is warm and dry.  Psychiatric: She has a normal mood and affect. Her behavior is normal.  Vitals reviewed.   Results for orders placed or performed in visit on 04/18/15  POCT Glucose (CBG)  Result Value Ref Range   POC Glucose 96 70 - 99 mg/dl      Assessment & Plan:   Encounter Diagnoses  Name Primary?  . Other emphysema ([place]) Yes  . Cigarette nicotine dependence with nicotine-induced disorder   . Chronic pain   . Anxiety   . Depression   . Vitamin D deficiency    . Screening for diabetes mellitus   . Screening cholesterol level   . Screening for thyroid disorder     -Pt to Call for [facility] appt -pt to Get baseline labs drawn -Records requested from Dr [name] about previous colonoscopy -Records of mammo 03/2015 requested from [facility] -Sign up for medassist- order albuterol mdi and nasonex -Counseled on smoking cessation -F/u OV 1 mo

## 2015-04-19 LAB — VITAMIN D 25 HYDROXY (VIT D DEFICIENCY, FRACTURES): VIT D 25 HYDROXY: 28 ng/mL — AB (ref 30–100)

## 2015-05-10 ENCOUNTER — Encounter: Payer: Self-pay | Admitting: Physician Assistant

## 2015-05-21 ENCOUNTER — Ambulatory Visit: Payer: Self-pay | Admitting: Physician Assistant

## 2015-05-23 ENCOUNTER — Encounter: Payer: Self-pay | Admitting: Physician Assistant

## 2015-06-06 ENCOUNTER — Ambulatory Visit: Payer: Self-pay | Admitting: Pulmonary Disease

## 2015-07-11 ENCOUNTER — Ambulatory Visit: Payer: Self-pay | Admitting: Physician Assistant

## 2015-07-11 ENCOUNTER — Encounter: Payer: Self-pay | Admitting: Physician Assistant

## 2015-07-11 VITALS — BP 102/60 | HR 74 | Temp 97.2°F | Ht 66.0 in | Wt 166.0 lb

## 2015-07-11 DIAGNOSIS — J449 Chronic obstructive pulmonary disease, unspecified: Secondary | ICD-10-CM

## 2015-07-11 DIAGNOSIS — E559 Vitamin D deficiency, unspecified: Secondary | ICD-10-CM

## 2015-07-11 DIAGNOSIS — G8929 Other chronic pain: Secondary | ICD-10-CM

## 2015-07-11 DIAGNOSIS — D696 Thrombocytopenia, unspecified: Secondary | ICD-10-CM

## 2015-07-11 DIAGNOSIS — F17219 Nicotine dependence, cigarettes, with unspecified nicotine-induced disorders: Secondary | ICD-10-CM

## 2015-07-11 DIAGNOSIS — F419 Anxiety disorder, unspecified: Secondary | ICD-10-CM

## 2015-07-11 DIAGNOSIS — F32A Depression, unspecified: Secondary | ICD-10-CM

## 2015-07-11 DIAGNOSIS — F329 Major depressive disorder, single episode, unspecified: Secondary | ICD-10-CM

## 2015-07-11 NOTE — Patient Instructions (Signed)
Start OTC vitamin d supplement CALL [facility]

## 2015-07-11 NOTE — Progress Notes (Signed)
BP 102/60 mmHg  Pulse 74  Temp(Src) 97.2 F (36.2 C)  Ht '5\' 6"'  (1.676 m)  Wt 166 lb (75.297 kg)  BMI 26.81 kg/m2  SpO2 98%   Subjective:    Patient ID: Destiny Villanueva, female    DOB: 1960-09-03, 55 y.o.   MRN: [id number]  HPI: Destiny Villanueva is a 55 y.o. female presenting on 07/11/2015 for Follow-up   HPI    -Pt has not contaced [facility] facility. Still having anxiety and depression.  No SI or HI  -counseled pt about need to come in for her appointments- she no-showed for her f/u in January and never rescheduled. She states understanding  -she is still smoking  Relevant past medical, surgical, family and social history reviewed and updated as indicated. Interim medical history since our last visit reviewed. Allergies and medications reviewed and updated.  Current outpatient prescriptions:  .  albuterol (PROVENTIL HFA;VENTOLIN HFA) 108 (90 Base) MCG/ACT inhaler, Inhale 2 puffs into the lungs every 6 (six) hours as needed for wheezing or shortness of breath., Disp: 3 Inhaler, Rfl: 0 .  albuterol (PROVENTIL) (2.5 MG/3ML) 0.083% nebulizer solution, Take 3 mLs (2.5 mg total) by nebulization every 6 (six) hours as needed for wheezing or shortness of breath., Disp: 75 mL, Rfl: 12 .  diphenhydrAMINE (BENADRYL) 50 MG tablet, Take 1 tablet (50 mg total) by mouth at bedtime., Disp: , Rfl:  .  escitalopram (LEXAPRO) 20 MG tablet, Take 20 mg by mouth daily., Disp: , Rfl:  .  Fluticasone Furoate-Vilanterol (BREO ELLIPTA) 100-25 MCG/INH AEPB, Inhale 1 puff into the lungs daily., Disp: 1 each, Rfl: 5   Review of Systems  Constitutional: Positive for fatigue. Negative for fever, chills, diaphoresis, appetite change and unexpected weight change.  HENT: Positive for congestion. Negative for dental problem, drooling, ear pain, facial swelling, hearing loss, mouth sores, sneezing, sore throat, trouble swallowing and voice change.   Eyes: Negative for pain, discharge, redness, itching and visual  disturbance.  Respiratory: Positive for cough and shortness of breath. Negative for choking and wheezing.   Cardiovascular: Negative for chest pain, palpitations and leg swelling.  Gastrointestinal: Positive for diarrhea and constipation. Negative for vomiting, abdominal pain and blood in stool.  Endocrine: Negative for cold intolerance, heat intolerance and polydipsia.  Genitourinary: Negative for dysuria, hematuria and decreased urine volume.  Musculoskeletal: Positive for back pain and arthralgias. Negative for gait problem.  Skin: Negative for rash.  Allergic/Immunologic: Positive for environmental allergies.  Neurological: Negative for seizures, syncope, light-headedness and headaches.  Hematological: Negative for adenopathy.  Psychiatric/Behavioral: Positive for dysphoric mood and agitation. Negative for suicidal ideas. The patient is nervous/anxious.     Per HPI unless specifically indicated above     Objective:    BP 102/60 mmHg  Pulse 74  Temp(Src) 97.2 F (36.2 C)  Ht '5\' 6"'  (1.676 m)  Wt 166 lb (75.297 kg)  BMI 26.81 kg/m2  SpO2 98%  Wt Readings from Last 3 Encounters:  07/11/15 166 lb (75.297 kg)  04/18/15 164 lb 9.6 oz (74.662 kg)  12/06/14 177 lb (80.287 kg)    Physical Exam  Constitutional: She is oriented to person, place, and time. She appears well-developed and well-nourished.  HENT:  Head: Normocephalic and atraumatic.  Neck: Neck supple.  Cardiovascular: Normal rate and regular rhythm.   Pulmonary/Chest: Effort normal and breath sounds normal.  Abdominal: Soft. Bowel sounds are normal. She exhibits no mass. There is no hepatosplenomegaly. There is no tenderness.  Musculoskeletal: She  exhibits no edema.  Lymphadenopathy:    She has no cervical adenopathy.  Neurological: She is alert and oriented to person, place, and time.  Skin: Skin is warm and dry.  Psychiatric: She has a normal mood and affect. Her behavior is normal.  Vitals reviewed.   Results  for orders placed or performed in visit on 04/18/15  Lipid Profile  Result Value Ref Range   Cholesterol 125 125 - 200 mg/dL   Triglycerides 153 (H) <150 mg/dL   HDL 29 (L) >=46 mg/dL   Total CHOL/HDL Ratio 4.3 <=5.0 Ratio   VLDL 31 (H) <30 mg/dL   LDL Cholesterol 65 <130 mg/dL  CBC w/Diff/Platelet  Result Value Ref Range   WBC 4.9 4.0 - 10.5 K/uL   RBC 4.65 3.87 - 5.11 MIL/uL   Hemoglobin 13.2 12.0 - 15.0 g/dL   HCT 39.2 36.0 - 46.0 %   MCV 84.3 78.0 - 100.0 fL   MCH 28.4 26.0 - 34.0 pg   MCHC 33.7 30.0 - 36.0 g/dL   RDW 14.0 11.5 - 15.5 %   Platelets 127 (L) 150 - 400 K/uL   MPV 10.3 8.6 - 12.4 fL   Neutrophils Relative % 53 43 - 77 %   Neutro Abs 2.6 1.7 - 7.7 K/uL   Lymphocytes Relative 35 12 - 46 %   Lymphs Abs 1.7 0.7 - 4.0 K/uL   Monocytes Relative 9 3 - 12 %   Monocytes Absolute 0.4 0.1 - 1.0 K/uL   Eosinophils Relative 2 0 - 5 %   Eosinophils Absolute 0.1 0.0 - 0.7 K/uL   Basophils Relative 1 0 - 1 %   Basophils Absolute 0.0 0.0 - 0.1 K/uL   Smear Review Criteria for review not met   COMPLETE METABOLIC PANEL WITH GFR  Result Value Ref Range   Sodium 142 135 - 146 mmol/L   Potassium 4.4 3.5 - 5.3 mmol/L   Chloride 105 98 - 110 mmol/L   CO2 25 20 - 31 mmol/L   Glucose, Bld 89 65 - 99 mg/dL   BUN 12 7 - 25 mg/dL   Creat 0.69 0.50 - 1.05 mg/dL   Total Bilirubin 0.5 0.2 - 1.2 mg/dL   Alkaline Phosphatase 53 33 - 130 U/L   AST 35 10 - 35 U/L   ALT 22 6 - 29 U/L   Total Protein 6.4 6.1 - 8.1 g/dL   Albumin 3.9 3.6 - 5.1 g/dL   Calcium 9.1 8.6 - 10.4 mg/dL   GFR, Est African American >89 >=60 mL/min   GFR, Est Non African American >89 >=60 mL/min  HgB A1c  Result Value Ref Range   Hgb A1c MFr Bld 5.5 <5.7 %   Mean Plasma Glucose 111 <117 mg/dL  TSH  Result Value Ref Range   TSH 2.009 0.350 - 4.500 uIU/mL  Vitamin D (25 hydroxy)  Result Value Ref Range   Vit D, 25-Hydroxy 28 (L) 30 - 100 ng/mL  POCT Glucose (CBG)  Result Value Ref Range   POC Glucose 96  70 - 99 mg/dl      Assessment & Plan:   Encounter Diagnoses  Name Primary?  . Thrombocytopenia ([place]) Yes  . Chronic obstructive pulmonary disease, unspecified COPD type ([place])   . Cigarette nicotine dependence with nicotine-induced disorder   . Anxiety   . Depression   . Chronic pain   . Vitamin D deficiency      -reviewed labs with pt  -Pt can take  otc vit d supp -Recheck platelets -Pt to Call [facility] -counseled pt on smoking cessation -F/u 3 months  -obtained colonoscopy report- done 09/24/11.  It recommends repeat in 3 year.  Will give pt [facility] discount application and refer to GI for repeat

## 2015-07-12 LAB — CBC WITH DIFFERENTIAL/PLATELET
BASOS ABS: 0 10*3/uL (ref 0.0–0.1)
Basophils Relative: 1 % (ref 0–1)
EOS PCT: 1 % (ref 0–5)
Eosinophils Absolute: 0 10*3/uL (ref 0.0–0.7)
HCT: 37.6 % (ref 36.0–46.0)
Hemoglobin: 12.6 g/dL (ref 12.0–15.0)
LYMPHS ABS: 1.8 10*3/uL (ref 0.7–4.0)
LYMPHS PCT: 42 % (ref 12–46)
MCH: 27.8 pg (ref 26.0–34.0)
MCHC: 33.5 g/dL (ref 30.0–36.0)
MCV: 82.8 fL (ref 78.0–100.0)
MONO ABS: 0.3 10*3/uL (ref 0.1–1.0)
MPV: 9.6 fL (ref 8.6–12.4)
Monocytes Relative: 7 % (ref 3–12)
Neutro Abs: 2.2 10*3/uL (ref 1.7–7.7)
Neutrophils Relative %: 49 % (ref 43–77)
PLATELETS: 122 10*3/uL — AB (ref 150–400)
RBC: 4.54 MIL/uL (ref 3.87–5.11)
RDW: 13.3 % (ref 11.5–15.5)
WBC: 4.4 10*3/uL (ref 4.0–10.5)

## 2015-07-13 ENCOUNTER — Other Ambulatory Visit: Payer: Self-pay | Admitting: Physician Assistant

## 2015-07-13 DIAGNOSIS — D696 Thrombocytopenia, unspecified: Secondary | ICD-10-CM

## 2015-07-25 ENCOUNTER — Other Ambulatory Visit: Payer: Self-pay | Admitting: Physician Assistant

## 2015-07-25 DIAGNOSIS — Z86010 Personal history of colonic polyps: Secondary | ICD-10-CM

## 2015-07-31 ENCOUNTER — Encounter: Payer: Self-pay | Admitting: Internal Medicine

## 2015-08-16 ENCOUNTER — Encounter: Payer: Self-pay | Admitting: Physician Assistant

## 2015-08-22 ENCOUNTER — Other Ambulatory Visit: Payer: Self-pay

## 2015-08-22 ENCOUNTER — Ambulatory Visit (INDEPENDENT_AMBULATORY_CARE_PROVIDER_SITE_OTHER): Payer: Self-pay | Admitting: Gastroenterology

## 2015-08-22 ENCOUNTER — Encounter: Payer: Self-pay | Admitting: Gastroenterology

## 2015-08-22 VITALS — BP 102/73 | HR 84 | Temp 97.3°F | Ht 66.0 in | Wt 163.8 lb

## 2015-08-22 DIAGNOSIS — R1084 Generalized abdominal pain: Secondary | ICD-10-CM

## 2015-08-22 DIAGNOSIS — R109 Unspecified abdominal pain: Secondary | ICD-10-CM | POA: Insufficient documentation

## 2015-08-22 DIAGNOSIS — R161 Splenomegaly, not elsewhere classified: Secondary | ICD-10-CM | POA: Insufficient documentation

## 2015-08-22 DIAGNOSIS — K219 Gastro-esophageal reflux disease without esophagitis: Secondary | ICD-10-CM | POA: Insufficient documentation

## 2015-08-22 DIAGNOSIS — Z86010 Personal history of colonic polyps: Secondary | ICD-10-CM

## 2015-08-22 DIAGNOSIS — R112 Nausea with vomiting, unspecified: Secondary | ICD-10-CM

## 2015-08-22 DIAGNOSIS — K625 Hemorrhage of anus and rectum: Secondary | ICD-10-CM | POA: Insufficient documentation

## 2015-08-22 DIAGNOSIS — R634 Abnormal weight loss: Secondary | ICD-10-CM

## 2015-08-22 DIAGNOSIS — Z860101 Personal history of adenomatous and serrated colon polyps: Secondary | ICD-10-CM | POA: Insufficient documentation

## 2015-08-22 DIAGNOSIS — R198 Other specified symptoms and signs involving the digestive system and abdomen: Secondary | ICD-10-CM | POA: Insufficient documentation

## 2015-08-22 NOTE — Progress Notes (Signed)
cc'ed to pcp °

## 2015-08-22 NOTE — Patient Instructions (Signed)
1. Colonoscopy and upper endoscopy with Dr. Gala Romney. Please see separate instructions. 2. I will request copy of prior imaging studies from [facility] for review. 3. Return to the office in 6-8 weeks for follow-up.

## 2015-08-22 NOTE — Progress Notes (Signed)
Can we please provide patient with Dexilant 60mg  daily before breakfast, #15 samples and patient assistance forms.

## 2015-08-22 NOTE — Progress Notes (Signed)
I tried to catch the pt before she left the office but was unsuccessful, I tried to call pt but there was only a home number available, no mobile number. I have left a message at her home number and asked her to come back to the office and pick up samples and patient assistance forms. Both are at the front desk.
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
## 2015-08-28 NOTE — Patient Instructions (Signed)
Destiny Villanueva  08/28/2015     @PREFPERIOPPHARMACY @   Your procedure is scheduled on   09/06/2015  Report to [facility] at  615  A.M.  Call this number if you have problems the morning of surgery:  [phone]   Remember:  Do not eat food or drink liquids after midnight.  Take these medicines the morning of surgery with A SIP OF WATER  Lexapro. Take your nebulizer before you come. Take your inhaler before you come and bring it with you.   Do not wear jewelry, make-up or nail polish.  Do not wear lotions, powders, or perfumes.  You may wear deodorant.  Do not shave 48 hours prior to surgery.  Men may shave face and neck.  Do not bring valuables to the hospital.  [facility] is not responsible for any belongings or valuables.  Contacts, dentures or bridgework may not be worn into surgery.  Leave your suitcase in the car.  After surgery it may be brought to your room.  For patients admitted to the hospital, discharge time will be determined by your treatment team.  Patients discharged the day of surgery will not be allowed to drive home.   Name and phone number of your driver:   family Special instructions:  Follow the diet and prep instructions given to you by Dr Roseanne Kaufman office.  Please read over the following fact sheets that you were given. Coughing and Deep Breathing, Surgical Site Infection Prevention, Anesthesia Post-op Instructions and Care and Recovery After Surgery      Esophagogastroduodenoscopy Esophagogastroduodenoscopy (EGD) is a procedure that is used to examine the lining of the esophagus, stomach, and first part of the small intestine (duodenum). A long, flexible, lighted tube with a camera attached (endoscope) is inserted down the throat to view these organs. This procedure is done to detect problems or abnormalities, such as inflammation, bleeding, ulcers, or growths, in order to treat them. The procedure lasts 5-20 minutes. It is usually  an outpatient procedure, but it may need to be performed in a hospital in emergency cases. LET [facility] CARE PROVIDER KNOW ABOUT:  Any allergies you have.  All medicines you are taking, including vitamins, herbs, eye drops, creams, and over-the-counter medicines.  Previous problems you or members of your family have had with the use of anesthetics.  Any blood disorders you have.  Previous surgeries you have had.  Medical conditions you have. RISKS AND COMPLICATIONS Generally, this is a safe procedure. However, problems can occur and include:  Infection.  Bleeding.  Tearing (perforation) of the esophagus, stomach, or duodenum.  Difficulty breathing or not being able to breathe.  Excessive sweating.  Spasms of the larynx.  Slowed heartbeat.  Low blood pressure. BEFORE THE PROCEDURE  Do not eat or drink anything after midnight on the night before the procedure or as directed by your health care provider.  Do not take your regular medicines before the procedure if your health care provider asks you not to. Ask your health care provider about changing or stopping those medicines.  If you wear dentures, be prepared to remove them before the procedure.  Arrange for someone to drive you home after the procedure. PROCEDURE  A numbing medicine (local anesthetic) may be sprayed in your throat for comfort and to stop you from gagging or coughing.  You will have an IV tube inserted in a vein in your hand or  arm. You will receive medicines and fluids through this tube.  You will be given a medicine to relax you (sedative).  A pain reliever will be given through the IV tube.  A mouth guard may be placed in your mouth to protect your teeth and to keep you from biting on the endoscope.  You will be asked to lie on your left side.  The endoscope will be inserted down your throat and into your esophagus, stomach, and duodenum.  Air will be put through the endoscope to allow  your health care provider to clearly view the lining of your esophagus.  The lining of your esophagus, stomach, and duodenum will be examined. During the exam, your health care provider may:  Remove tissue to be examined under a microscope (biopsy) for inflammation, infection, or other medical problems.  Remove growths.  Remove objects (foreign bodies) that are stuck.  Treat any bleeding with medicines or other devices that stop tissues from bleeding (hot cautery, clipping devices).  Widen (dilate) or stretch narrowed areas of your esophagus and stomach.  The endoscope will be withdrawn. AFTER THE PROCEDURE  You will be taken to a recovery area for observation. Your blood pressure, heart rate, breathing rate, and blood oxygen level will be monitored often until the medicines you were given have worn off.  Do not eat or drink anything until the numbing medicine has worn off and your gag reflex has returned. You may choke.  Your health care provider should be able to discuss his or her findings with you. It will take longer to discuss the test results if any biopsies were taken.   This information is not intended to replace advice given to you by your health care provider. Make sure you discuss any questions you have with your health care provider.   Document Released: 08/08/2004 Document Revised: 04/28/2014 Document Reviewed: 03/10/2012 Elsevier Interactive Patient Education 2016 [place]. Esophagogastroduodenoscopy, Care After Refer to this sheet in the next few weeks. These instructions provide you with information about caring for yourself after your procedure. Your health care provider may also give you more specific instructions. Your treatment has been planned according to current medical practices, but problems sometimes occur. Call your health care provider if you have any problems or questions after your procedure. WHAT TO EXPECT AFTER THE PROCEDURE After your procedure, it  is typical to feel:  Soreness in your throat.  Pain with swallowing.  Sick to your stomach (nauseous).  Bloated.  Dizzy.  Fatigued. HOME CARE INSTRUCTIONS  Do not eat or drink anything until the numbing medicine (local anesthetic) has worn off and your gag reflex has returned. You will know that the local anesthetic has worn off when you can swallow comfortably.  Do not drive or operate machinery until directed by your health care provider.  Take medicines only as directed by your health care provider. SEEK MEDICAL CARE IF:   You cannot stop coughing.  You are not urinating at all or less than usual. SEEK IMMEDIATE MEDICAL CARE IF:  You have difficulty swallowing.  You cannot eat or drink.  You have worsening throat or chest pain.  You have dizziness or lightheadedness or you faint.  You have nausea or vomiting.  You have chills.  You have a fever.  You have severe abdominal pain.  You have black, tarry, or bloody stools.   This information is not intended to replace advice given to you by your health care provider. Make sure you discuss  any questions you have with your health care provider.   Document Released: 03/24/2012 Document Revised: 04/28/2014 Document Reviewed: 03/24/2012 Elsevier Interactive Patient Education 2016 [employer]. Colonoscopy A colonoscopy is an exam to look at the entire large intestine (colon). This exam can help find problems such as tumors, polyps, inflammation, and areas of bleeding. The exam takes about 1 hour.  LET [facility] CARE PROVIDER KNOW ABOUT:   Any allergies you have.  All medicines you are taking, including vitamins, herbs, eye drops, creams, and over-the-counter medicines.  Previous problems you or members of your family have had with the use of anesthetics.  Any blood disorders you have.  Previous surgeries you have had.  Medical conditions you have. RISKS AND COMPLICATIONS  Generally, this is a safe  procedure. However, as with any procedure, complications can occur. Possible complications include:  Bleeding.  Tearing or rupture of the colon wall.  Reaction to medicines given during the exam.  Infection (rare). BEFORE THE PROCEDURE   Ask your health care provider about changing or stopping your regular medicines.  You may be prescribed an oral bowel prep. This involves drinking a large amount of medicated liquid, starting the day before your procedure. The liquid will cause you to have multiple loose stools until your stool is almost clear or light green. This cleans out your colon in preparation for the procedure.  Do not eat or drink anything else once you have started the bowel prep, unless your health care provider tells you it is safe to do so.  Arrange for someone to drive you home after the procedure. PROCEDURE   You will be given medicine to help you relax (sedative).  You will lie on your side with your knees bent.  A long, flexible tube with a light and camera on the end (colonoscope) will be inserted through the rectum and into the colon. The camera sends video back to a computer screen as it moves through the colon. The colonoscope also releases carbon dioxide gas to inflate the colon. This helps your health care provider see the area better.  During the exam, your health care provider may take a small tissue sample (biopsy) to be examined under a microscope if any abnormalities are found.  The exam is finished when the entire colon has been viewed. AFTER THE PROCEDURE   Do not drive for 24 hours after the exam.  You may have a small amount of blood in your stool.  You may pass moderate amounts of gas and have mild abdominal cramping or bloating. This is caused by the gas used to inflate your colon during the exam.  Ask when your test results will be ready and how you will get your results. Make sure you get your test results.   This information is not intended  to replace advice given to you by your health care provider. Make sure you discuss any questions you have with your health care provider.   Document Released: 04/04/2000 Document Revised: 01/26/2013 Document Reviewed: 12/13/2012 Elsevier Interactive Patient Education 2016 [employer] Colonoscopy, Care After Refer to this sheet in the next few weeks. These instructions provide you with information on caring for yourself after your procedure. Your health care provider may also give you more specific instructions. Your treatment has been planned according to current medical practices, but problems sometimes occur. Call your health care provider if you have any problems or questions after your procedure. WHAT TO EXPECT AFTER THE PROCEDURE  After your  procedure, it is typical to have the following:  A small amount of blood in your stool.  Moderate amounts of gas and mild abdominal cramping or bloating. HOME CARE INSTRUCTIONS  Do not drive, operate machinery, or sign important documents for 24 hours.  You may shower and resume your regular physical activities, but move at a slower pace for the first 24 hours.  Take frequent rest periods for the first 24 hours.  Walk around or put a warm pack on your abdomen to help reduce abdominal cramping and bloating.  Drink enough fluids to keep your urine clear or pale yellow.  You may resume your normal diet as instructed by your health care provider. Avoid heavy or fried foods that are hard to digest.  Avoid drinking alcohol for 24 hours or as instructed by your health care provider.  Only take over-the-counter or prescription medicines as directed by your health care provider.  If a tissue sample (biopsy) was taken during your procedure:  Do not take aspirin or blood thinners for 7 days, or as instructed by your health care provider.  Do not drink alcohol for 7 days, or as instructed by your health care provider.  Eat soft foods for the first  24 hours. SEEK MEDICAL CARE IF: You have persistent spotting of blood in your stool 2-3 days after the procedure. SEEK IMMEDIATE MEDICAL CARE IF:  You have more than a small spotting of blood in your stool.  You pass large blood clots in your stool.  Your abdomen is swollen (distended).  You have nausea or vomiting.  You have a fever.  You have increasing abdominal pain that is not relieved with medicine.   This information is not intended to replace advice given to you by your health care provider. Make sure you discuss any questions you have with your health care provider.   Document Released: 11/20/2003 Document Revised: 01/26/2013 Document Reviewed: 12/13/2012 Elsevier Interactive Patient Education 2016 [employer] PATIENT INSTRUCTIONS POST-ANESTHESIA  IMMEDIATELY FOLLOWING SURGERY:  Do not drive or operate machinery for the first twenty four hours after surgery.  Do not make any important decisions for twenty four hours after surgery or while taking narcotic pain medications or sedatives.  If you develop intractable nausea and vomiting or a severe headache please notify your doctor immediately.  FOLLOW-UP:  Please make an appointment with your surgeon as instructed. You do not need to follow up with anesthesia unless specifically instructed to do so.  WOUND CARE INSTRUCTIONS (if applicable):  Keep a dry clean dressing on the anesthesia/puncture wound site if there is drainage.  Once the wound has quit draining you may leave it open to air.  Generally you should leave the bandage intact for twenty four hours unless there is drainage.  If the epidural site drains for more than 36-48 hours please call the anesthesia department.  QUESTIONS?:  Please feel free to call your physician or the hospital operator if you have any questions, and they will be happy to assist you.

## 2015-08-29 ENCOUNTER — Encounter (HOSPITAL_COMMUNITY)
Admission: RE | Admit: 2015-08-29 | Discharge: 2015-08-29 | Disposition: A | Discharge status: Home / Self Care | Payer: Self-pay | Source: Ambulatory Visit | Attending: Internal Medicine | Admitting: Internal Medicine

## 2015-08-29 ENCOUNTER — Encounter (HOSPITAL_COMMUNITY): Payer: Self-pay

## 2015-08-29 ENCOUNTER — Other Ambulatory Visit: Payer: Self-pay

## 2015-08-29 DIAGNOSIS — Z01818 Encounter for other preprocedural examination: Secondary | ICD-10-CM | POA: Insufficient documentation

## 2015-08-29 HISTORY — DX: Other specified noninflammatory disorders of vulva and perineum: N90.89

## 2015-08-29 HISTORY — DX: Spondylosis without myelopathy or radiculopathy, cervical region: M47.812

## 2015-08-29 LAB — CBC
HEMATOCRIT: 40.4 % (ref 36.0–46.0)
Hemoglobin: 13.4 g/dL (ref 12.0–15.0)
MCH: 28 pg (ref 26.0–34.0)
MCHC: 33.2 g/dL (ref 30.0–36.0)
MCV: 84.5 fL (ref 78.0–100.0)
Platelets: 107 10*3/uL — ABNORMAL LOW (ref 150–400)
RBC: 4.78 MIL/uL (ref 3.87–5.11)
RDW: 13 % (ref 11.5–15.5)
WBC: 4.3 10*3/uL (ref 4.0–10.5)

## 2015-08-29 LAB — BASIC METABOLIC PANEL
Anion gap: 6 (ref 5–15)
BUN: 11 mg/dL (ref 6–20)
CHLORIDE: 105 mmol/L (ref 101–111)
CO2: 28 mmol/L (ref 22–32)
CREATININE: 0.72 mg/dL (ref 0.44–1.00)
Calcium: 9.3 mg/dL (ref 8.9–10.3)
GFR calc non Af Amer: >60 mL/min (ref >60)
Glucose, Bld: 100 mg/dL — ABNORMAL HIGH (ref 65–99)
POTASSIUM: 4.3 mmol/L (ref 3.5–5.1)
Sodium: 139 mmol/L (ref 135–145)

## 2015-09-03 NOTE — Progress Notes (Signed)
Quick Note:  Preo-op labs show stable thrombocytopenia. ______

## 2015-09-06 ENCOUNTER — Ambulatory Visit (HOSPITAL_COMMUNITY): Payer: Self-pay | Admitting: Anesthesiology

## 2015-09-06 ENCOUNTER — Encounter (HOSPITAL_COMMUNITY): Payer: Self-pay | Admitting: *Deleted

## 2015-09-06 ENCOUNTER — Encounter (HOSPITAL_COMMUNITY)
Admission: RE | Disposition: A | Discharge status: Home / Self Care | Payer: Self-pay | Source: Ambulatory Visit | Attending: Internal Medicine

## 2015-09-06 ENCOUNTER — Ambulatory Visit (HOSPITAL_COMMUNITY)
Admission: RE | Admit: 2015-09-06 | Discharge: 2015-09-06 | Disposition: A | Discharge status: Home / Self Care | Payer: Self-pay | Source: Ambulatory Visit | Attending: Internal Medicine | Admitting: Internal Medicine

## 2015-09-06 DIAGNOSIS — J449 Chronic obstructive pulmonary disease, unspecified: Secondary | ICD-10-CM | POA: Insufficient documentation

## 2015-09-06 DIAGNOSIS — M1991 Primary osteoarthritis, unspecified site: Secondary | ICD-10-CM | POA: Insufficient documentation

## 2015-09-06 DIAGNOSIS — R112 Nausea with vomiting, unspecified: Secondary | ICD-10-CM

## 2015-09-06 DIAGNOSIS — F419 Anxiety disorder, unspecified: Secondary | ICD-10-CM | POA: Insufficient documentation

## 2015-09-06 DIAGNOSIS — D122 Benign neoplasm of ascending colon: Secondary | ICD-10-CM

## 2015-09-06 DIAGNOSIS — K219 Gastro-esophageal reflux disease without esophagitis: Secondary | ICD-10-CM | POA: Insufficient documentation

## 2015-09-06 DIAGNOSIS — Z86010 Personal history of colonic polyps: Secondary | ICD-10-CM | POA: Insufficient documentation

## 2015-09-06 DIAGNOSIS — R12 Heartburn: Secondary | ICD-10-CM | POA: Insufficient documentation

## 2015-09-06 DIAGNOSIS — K222 Esophageal obstruction: Secondary | ICD-10-CM

## 2015-09-06 DIAGNOSIS — G4730 Sleep apnea, unspecified: Secondary | ICD-10-CM | POA: Insufficient documentation

## 2015-09-06 DIAGNOSIS — K640 First degree hemorrhoids: Secondary | ICD-10-CM

## 2015-09-06 DIAGNOSIS — K921 Melena: Secondary | ICD-10-CM

## 2015-09-06 DIAGNOSIS — K319 Disease of stomach and duodenum, unspecified: Secondary | ICD-10-CM | POA: Insufficient documentation

## 2015-09-06 DIAGNOSIS — Z79899 Other long term (current) drug therapy: Secondary | ICD-10-CM | POA: Insufficient documentation

## 2015-09-06 DIAGNOSIS — K3189 Other diseases of stomach and duodenum: Secondary | ICD-10-CM

## 2015-09-06 DIAGNOSIS — K449 Diaphragmatic hernia without obstruction or gangrene: Secondary | ICD-10-CM

## 2015-09-06 DIAGNOSIS — F329 Major depressive disorder, single episode, unspecified: Secondary | ICD-10-CM | POA: Insufficient documentation

## 2015-09-06 DIAGNOSIS — R634 Abnormal weight loss: Secondary | ICD-10-CM | POA: Insufficient documentation

## 2015-09-06 DIAGNOSIS — R1013 Epigastric pain: Secondary | ICD-10-CM | POA: Insufficient documentation

## 2015-09-06 HISTORY — PX: ESOPHAGOGASTRODUODENOSCOPY (EGD) WITH PROPOFOL: SHX5813

## 2015-09-06 HISTORY — PX: COLONOSCOPY WITH PROPOFOL: SHX5780

## 2015-09-06 SURGERY — COLONOSCOPY WITH PROPOFOL
Anesthesia: Monitor Anesthesia Care

## 2015-09-06 MED ORDER — MIDAZOLAM HCL 2 MG/2ML IJ SOLN
INTRAMUSCULAR | Status: AC
  Filled 2015-09-06: qty 2

## 2015-09-06 MED ORDER — PROPOFOL 500 MG/50ML IV EMUL
INTRAVENOUS | Status: DC | PRN
  Administered 2015-09-06: 100 ug/kg/min via INTRAVENOUS
  Administered 2015-09-06: 08:00:00 via INTRAVENOUS

## 2015-09-06 MED ORDER — SODIUM CHLORIDE 0.9% FLUSH
INTRAVENOUS | Status: AC
  Filled 2015-09-06: qty 10

## 2015-09-06 MED ORDER — GLYCOPYRROLATE 0.2 MG/ML IJ SOLN
INTRAMUSCULAR | Status: AC
  Filled 2015-09-06: qty 1

## 2015-09-06 MED ORDER — PROPOFOL 10 MG/ML IV BOLUS
INTRAVENOUS | Status: AC
  Filled 2015-09-06: qty 20

## 2015-09-06 MED ORDER — GLYCOPYRROLATE 0.2 MG/ML IJ SOLN
0.2000 mg | Freq: Once | INTRAMUSCULAR | Status: AC
  Administered 2015-09-06: 0.2 mg via INTRAVENOUS

## 2015-09-06 MED ORDER — ONDANSETRON HCL 4 MG/2ML IJ SOLN: 4.0000 mg | Freq: Once | INTRAMUSCULAR | Status: DC | PRN

## 2015-09-06 MED ORDER — METOCLOPRAMIDE HCL 5 MG/ML IJ SOLN
INTRAMUSCULAR | Status: DC | PRN
  Administered 2015-09-06: 10 mg via INTRAVENOUS

## 2015-09-06 MED ORDER — LIDOCAINE VISCOUS 2 % MT SOLN
OROMUCOSAL | Status: AC
  Filled 2015-09-06: qty 15

## 2015-09-06 MED ORDER — LACTATED RINGERS IV SOLN
INTRAVENOUS | Status: DC
  Administered 2015-09-06 (×2): via INTRAVENOUS

## 2015-09-06 MED ORDER — ONDANSETRON HCL 4 MG/2ML IJ SOLN
INTRAMUSCULAR | Status: AC
  Filled 2015-09-06: qty 2

## 2015-09-06 MED ORDER — DEXAMETHASONE SODIUM PHOSPHATE 4 MG/ML IJ SOLN
INTRAMUSCULAR | Status: AC
  Filled 2015-09-06: qty 1

## 2015-09-06 MED ORDER — PROPOFOL 10 MG/ML IV BOLUS
INTRAVENOUS | Status: AC
  Filled 2015-09-06: qty 40

## 2015-09-06 MED ORDER — PROMETHAZINE HCL 25 MG/ML IJ SOLN
INTRAMUSCULAR | Status: AC
  Filled 2015-09-06: qty 1

## 2015-09-06 MED ORDER — MIDAZOLAM HCL 2 MG/2ML IJ SOLN
1.0000 mg | INTRAMUSCULAR | Status: DC | PRN
  Administered 2015-09-06: 2 mg via INTRAVENOUS

## 2015-09-06 MED ORDER — MIDAZOLAM HCL 5 MG/5ML IJ SOLN
INTRAMUSCULAR | Status: DC | PRN
  Administered 2015-09-06: 2 mg via INTRAVENOUS

## 2015-09-06 MED ORDER — FENTANYL CITRATE (PF) 100 MCG/2ML IJ SOLN: 25.0000 ug | INTRAMUSCULAR | Status: DC | PRN

## 2015-09-06 MED ORDER — LIDOCAINE VISCOUS 2 % MT SOLN
15.0000 mL | Freq: Once | OROMUCOSAL | Status: AC
  Administered 2015-09-06: 6 mL via OROMUCOSAL

## 2015-09-06 MED ORDER — SIMETHICONE 40 MG/0.6ML PO SUSP
ORAL | Status: AC
  Filled 2015-09-06: qty 0.6

## 2015-09-06 MED ORDER — DEXAMETHASONE SODIUM PHOSPHATE 4 MG/ML IJ SOLN
4.0000 mg | Freq: Once | INTRAMUSCULAR | Status: AC
  Administered 2015-09-06: 4 mg via INTRAVENOUS

## 2015-09-06 MED ORDER — ONDANSETRON HCL 4 MG/2ML IJ SOLN
4.0000 mg | Freq: Once | INTRAMUSCULAR | Status: AC
  Administered 2015-09-06: 4 mg via INTRAVENOUS

## 2015-09-06 NOTE — Anesthesia Preprocedure Evaluation (Signed)
Anesthesia Evaluation  Patient identified by MRN, date of birth, ID band Patient awake    Reviewed: Allergy & Precautions, NPO status , Patient's Chart, lab work & pertinent test results  Airway Mallampati: II  TM Distance: >3 FB     Dental  (+) Edentulous Upper, Edentulous Lower   Pulmonary sleep apnea and Continuous Positive Airway Pressure Ventilation , COPD, Current Smoker,    breath sounds clear to auscultation       Cardiovascular negative cardio ROS   Rhythm:Regular Rate:Normal     Neuro/Psych PSYCHIATRIC DISORDERS Anxiety Depression    GI/Hepatic GERD  Medicated,  Endo/Other    Renal/GU      Musculoskeletal   Abdominal   Peds  Hematology   Anesthesia Other Findings   Reproductive/Obstetrics                             Anesthesia Physical Anesthesia Plan  ASA: III  Anesthesia Plan: MAC   Post-op Pain Management:    Induction: Intravenous  Airway Management Planned: Simple Face Mask  Additional Equipment:   Intra-op Plan:   Post-operative Plan:   Informed Consent: I have reviewed the patients History and Physical, chart, labs and discussed the procedure including the risks, benefits and alternatives for the proposed anesthesia with the patient or authorized representative who has indicated his/her understanding and acceptance.     Plan Discussed with:   Anesthesia Plan Comments:         Anesthesia Quick Evaluation

## 2015-09-06 NOTE — Discharge Instructions (Signed)
Colonoscopy Discharge Instructions  Read the instructions outlined below and refer to this sheet in the next few weeks. These discharge instructions provide you with general information on caring for yourself after you leave the hospital. Your doctor may also give you specific instructions. While your treatment has been planned according to the most current medical practices available, unavoidable complications occasionally occur. If you have any problems or questions after discharge, call Dr. Gala Romney at [phone]. ACTIVITY  You may resume your regular activity, but move at a slower pace for the next 24 hours.   Take frequent rest periods for the next 24 hours.   Walking will help get rid of the air and reduce the bloated feeling in your belly (abdomen).   No driving for 24 hours (because of the medicine (anesthesia) used during the test).    Do not sign any important legal documents or operate any machinery for 24 hours (because of the anesthesia used during the test).  NUTRITION  Drink plenty of fluids.   You may resume your normal diet as instructed by your doctor.   Begin with a light meal and progress to your normal diet. Heavy or fried foods are harder to digest and may make you feel sick to your stomach (nauseated).   Avoid alcoholic beverages for 24 hours or as instructed.  MEDICATIONS  You may resume your normal medications unless your doctor tells you otherwise.  WHAT YOU CAN EXPECT TODAY  Some feelings of bloating in the abdomen.   Passage of more gas than usual.   Spotting of blood in your stool or on the toilet paper.  IF YOU HAD POLYPS REMOVED DURING THE COLONOSCOPY:  No aspirin products for 7 days or as instructed.   No alcohol for 7 days or as instructed.   Eat a soft diet for the next 24 hours.  FINDING OUT THE RESULTS OF YOUR TEST Not all test results are available during your visit. If your test results are not back during the visit, make an appointment  with your caregiver to find out the results. Do not assume everything is normal if you have not heard from your caregiver or the medical facility. It is important for you to follow up on all of your test results.  SEEK IMMEDIATE MEDICAL ATTENTION IF:  You have more than a spotting of blood in your stool.   Your belly is swollen (abdominal distention).   You are nauseated or vomiting.   You have a temperature over 101.  You have abdominal pain or discomfort that is severe or gets worse throughout the day.    EGD Discharge instructions Please read the instructions outlined below and refer to this sheet in the next few weeks. These discharge instructions provide you with general information on caring for yourself after you leave the hospital. Your doctor may also give you specific instructions. While your treatment has been planned according to the most current medical practices available, unavoidable complications occasionally occur. If you have any problems or questions after discharge, please call your doctor. ACTIVITY  You may resume your regular activity but move at a slower pace for the next 24 hours.   Take frequent rest periods for the next 24 hours.   Walking will help expel (get rid of) the air and reduce the bloated feeling in your abdomen.   No driving for 24 hours (because of the anesthesia (medicine) used during the test).   You may shower.   Do not sign any  important legal documents or operate any machinery for 24 hours (because of the anesthesia used during the test).  NUTRITION  Drink plenty of fluids.   You may resume your normal diet.   Begin with a light meal and progress to your normal diet.   Avoid alcoholic beverages for 24 hours or as instructed by your caregiver.  MEDICATIONS  You may resume your normal medications unless your caregiver tells you otherwise.  WHAT YOU CAN EXPECT TODAY  You may experience abdominal discomfort such as a feeling of  fullness or gas pains.  FOLLOW-UP  Your doctor will discuss the results of your test with you.  SEEK IMMEDIATE MEDICAL ATTENTION IF ANY OF THE FOLLOWING OCCUR:  Excessive nausea (feeling sick to your stomach) and/or vomiting.   Severe abdominal pain and distention (swelling).   Trouble swallowing.   Temperature over 101 F (37.8 C).   Rectal bleeding or vomiting of blood.     Polyp and hemorrhoids information provided  Further recommendations to follow pending review of pathology  Pamphlet on hemorrhoid banding provided.  Office visit with [place] in 6 weeks.   Esophagogastrectomy, Care After Refer to this sheet in the next few weeks. These instructions provide you with information on caring for yourself after your procedure. Your caregiver may also give you more specific instructions. Your treatment has been planned according to current medical practices, but problems sometimes occur. Call your caregiver if you have any problems or questions after your procedure. HOME CARE INSTRUCTIONS   Know how to care for your temporary feeding tube. You will be given instructions on how to use it before you leave the hospital. You will have one for about 2-3 weeks. The feeding tube will be removed when you are able to take an oral diet that is sufficient. A home health nurse may come to your home to supervise your use of the tube.  Take all medicines as directed.  Do not use over-the-counter medicines, vitamins, or herbal supplements without your caregiver's approval.  Do not drive while you are using narcotic pain medicines or medicines for nausea.  Follow the diet prescribed by your caregiver.  Follow your caregiver's recommendations for activity level, amount of weight you can lift, and time frame for return to work. These recommendations will depend on the type of surgery you had and your general health.  Care for your surgical cuts (incisions) and bandages (dressings) as directed by  your caregiver.  Check with your surgeon before you fly on a plane. Air travel may be allowed 1 week after surgery.  You may shower unless instructed otherwise. Do not scrub the incisions. Pat them dry. The use of your arms overhead to wash your hair may cause fatigue, shortness of breath, or pain. You may need someone to help you.  No baths, hot tubs, or swimming until your caregiver approves.  Check with your surgeon before resuming sexual activity. Sexual activity may be restricted for 4-6 weeks after surgery to allow the incisions to heal adequately.  Keep all follow-up appointments as directed by your caregiver. SEEK MEDICAL CARE IF:   You have increased pain that does not improve with medicine.  You have trouble swallowing.  You have heartburn or indigestion.  You feel nauseous or vomit.  You cannot have a bowel movement (constipated) or have diarrhea.  You have a rash.  You have burning with urination, or need to urinate more frequently than usual.  You have a new cough. [place]  CARE IF:   You have a fever or persistent symptoms for more than 2-3 days.  You have a fever and your symptoms suddenly get worse.  You have chills.  You have chest pain.  You have shortness of breath.  Your dressing on your incision becomes soaked with blood.  Your incision becomes red, opens up, or has a creamy or bad smelling discharge.  You have a severe increase in pain.  You have pain, tenderness, or redness in your calf.   This information is not intended to replace advice given to you by your health care provider. Make sure you discuss any questions you have with your health care provider.   Document Released: 10/07/2011 Document Revised: 01/26/2013 Document Reviewed: 10/07/2011 Elsevier Interactive Patient Education 2016 [employer] Colonoscopy, Care After Refer to this sheet in the next few weeks. These instructions provide you with information on caring  for yourself after your procedure. Your health care provider may also give you more specific instructions. Your treatment has been planned according to current medical practices, but problems sometimes occur. Call your health care provider if you have any problems or questions after your procedure. WHAT TO EXPECT AFTER THE PROCEDURE  After your procedure, it is typical to have the following:  A small amount of blood in your stool.  Moderate amounts of gas and mild abdominal cramping or bloating. HOME CARE INSTRUCTIONS  Do not drive, operate machinery, or sign important documents for 24 hours.  You may shower and resume your regular physical activities, but move at a slower pace for the first 24 hours.  Take frequent rest periods for the first 24 hours.  Walk around or put a warm pack on your abdomen to help reduce abdominal cramping and bloating.  Drink enough fluids to keep your urine clear or pale yellow.  You may resume your normal diet as instructed by your health care provider. Avoid heavy or fried foods that are hard to digest.  Avoid drinking alcohol for 24 hours or as instructed by your health care provider.  Only take over-the-counter or prescription medicines as directed by your health care provider.  If a tissue sample (biopsy) was taken during your procedure:  Do not take aspirin or blood thinners for 7 days, or as instructed by your health care provider.  Do not drink alcohol for 7 days, or as instructed by your health care provider.  Eat soft foods for the first 24 hours. SEEK MEDICAL CARE IF: You have persistent spotting of blood in your stool 2-3 days after the procedure. SEEK IMMEDIATE MEDICAL CARE IF:  You have more than a small spotting of blood in your stool.  You pass large blood clots in your stool.  Your abdomen is swollen (distended).  You have nausea or vomiting.  You have a fever.  You have increasing abdominal pain that is not relieved with  medicine.   This information is not intended to replace advice given to you by your health care provider. Make sure you discuss any questions you have with your health care provider.   Document Released: 11/20/2003 Document Revised: 01/26/2013 Document Reviewed: 12/13/2012 Elsevier Interactive Patient Education 2016 Elsevier Inc. Colon Polyps Polyps are lumps of extra tissue growing inside the body. Polyps can grow in the large intestine (colon). Most colon polyps are noncancerous (benign). However, some colon polyps can become cancerous over time. Polyps that are larger than a pea may be harmful. To be safe, caregivers remove and test all polyps.  CAUSES  Polyps form when mutations in the genes cause your cells to grow and divide even though no more tissue is needed. RISK FACTORS There are a number of risk factors that can increase your chances of getting colon polyps. They include:  Being older than 50 years.  Family history of colon polyps or colon cancer.  Long-term colon diseases, such as colitis or Crohn disease.  Being overweight.  Smoking.  Being inactive.  Drinking too much alcohol. SYMPTOMS  Most small polyps do not cause symptoms. If symptoms are present, they may include:  Blood in the stool. The stool may look dark red or black.  Constipation or diarrhea that lasts longer than 1 week. DIAGNOSIS People often do not know they have polyps until their caregiver finds them during a regular checkup. Your caregiver can use 4 tests to check for polyps:  Digital rectal exam. The caregiver wears gloves and feels inside the rectum. This test would find polyps only in the rectum.  Barium enema. The caregiver puts a liquid called barium into your rectum before taking X-rays of your colon. Barium makes your colon look white. Polyps are dark, so they are easy to see in the X-ray pictures.  Sigmoidoscopy. A thin, flexible tube (sigmoidoscope) is placed into your rectum. The  sigmoidoscope has a light and tiny camera in it. The caregiver uses the sigmoidoscope to look at the last third of your colon.  Colonoscopy. This test is like sigmoidoscopy, but the caregiver looks at the entire colon. This is the most common method for finding and removing polyps. TREATMENT  Any polyps will be removed during a sigmoidoscopy or colonoscopy. The polyps are then tested for cancer. PREVENTION  To help lower your risk of getting more colon polyps:  Eat plenty of fruits and vegetables. Avoid eating fatty foods.  Do not smoke.  Avoid drinking alcohol.  Exercise every day.  Lose weight if recommended by your caregiver.  Eat plenty of calcium and folate. Foods that are rich in calcium include milk, cheese, and broccoli. Foods that are rich in folate include chickpeas, kidney beans, and spinach. HOME CARE INSTRUCTIONS Keep all follow-up appointments as directed by your caregiver. You may need periodic exams to check for polyps. SEEK MEDICAL CARE IF: You notice bleeding during a bowel movement.   This information is not intended to replace advice given to you by your health care provider. Make sure you discuss any questions you have with your health care provider.   Document Released: 01/02/2004 Document Revised: 04/28/2014 Document Reviewed: 06/17/2011 Elsevier Interactive Patient Education 2016 [employer]. Hemorrhoids Hemorrhoids are swollen veins around the rectum or anus. There are two types of hemorrhoids:   Internal hemorrhoids. These occur in the veins just inside the rectum. They may poke through to the outside and become irritated and painful.  External hemorrhoids. These occur in the veins outside the anus and can be felt as a painful swelling or hard lump near the anus. CAUSES  Pregnancy.   Obesity.   Constipation or diarrhea.   Straining to have a bowel movement.   Sitting for long periods on the toilet.  Heavy lifting or other activity that  caused you to strain.  Anal intercourse. SYMPTOMS   Pain.   Anal itching or irritation.   Rectal bleeding.   Fecal leakage.   Anal swelling.   One or more lumps around the anus.  DIAGNOSIS  Your caregiver may be able to diagnose hemorrhoids by visual examination. Other examinations  or tests that may be performed include:   Examination of the rectal area with a gloved hand (digital rectal exam).   Examination of anal canal using a small tube (scope).   A blood test if you have lost a significant amount of blood.  A test to look inside the colon (sigmoidoscopy or colonoscopy). TREATMENT Most hemorrhoids can be treated at home. However, if symptoms do not seem to be getting better or if you have a lot of rectal bleeding, your caregiver may perform a procedure to help make the hemorrhoids get smaller or remove them completely. Possible treatments include:   Placing a rubber band at the base of the hemorrhoid to cut off the circulation (rubber band ligation).   Injecting a chemical to shrink the hemorrhoid (sclerotherapy).   Using a tool to burn the hemorrhoid (infrared light therapy).   Surgically removing the hemorrhoid (hemorrhoidectomy).   Stapling the hemorrhoid to block blood flow to the tissue (hemorrhoid stapling).  HOME CARE INSTRUCTIONS   Eat foods with fiber, such as whole grains, beans, nuts, fruits, and vegetables. Ask your doctor about taking products with added fiber in them (fibersupplements).  Increase fluid intake. Drink enough water and fluids to keep your urine clear or pale yellow.   Exercise regularly.   Go to the bathroom when you have the urge to have a bowel movement. Do not wait.   Avoid straining to have bowel movements.   Keep the anal area dry and clean. Use wet toilet paper or moist towelettes after a bowel movement.   Medicated creams and suppositories may be used or applied as directed.   Only take over-the-counter  or prescription medicines as directed by your caregiver.   Take warm sitz baths for 15-20 minutes, 3-4 times a day to ease pain and discomfort.   Place ice packs on the hemorrhoids if they are tender and swollen. Using ice packs between sitz baths may be helpful.   Put ice in a plastic bag.   Place a towel between your skin and the bag.   Leave the ice on for 15-20 minutes, 3-4 times a day.   Do not use a donut-shaped pillow or sit on the toilet for long periods. This increases blood pooling and pain.  SEEK MEDICAL CARE IF:  You have increasing pain and swelling that is not controlled by treatment or medicine.  You have uncontrolled bleeding.  You have difficulty or you are unable to have a bowel movement.  You have pain or inflammation outside the area of the hemorrhoids. MAKE SURE YOU:  Understand these instructions.  Will watch your condition.  Will get help right away if you are not doing well or get worse.   This information is not intended to replace advice given to you by your health care provider. Make sure you discuss any questions you have with your health care provider.   Document Released: 04/04/2000 Document Revised: 03/24/2012 Document Reviewed: 02/10/2012 Elsevier Interactive Patient Education [employer].

## 2015-09-06 NOTE — Op Note (Signed)
[facility] Patient Name: Destiny Villanueva Procedure Date: 09/06/2015 7:35 AM MRN: [id number] Date of Birth: [birth year] Attending MD: Norvel Richards , MD CSN: [id number] Age: 55 Admit Type: Outpatient Procedure:                Upper GI endoscopy with gastric biopsy Indications:              Epigastric abdominal pain, Heartburn, Nausea with                            vomiting, Weight loss; no dysphagia Providers:                Norvel Richards, MD, Gwenlyn Fudge, RN, Isabella Stalling, Technician Referring MD:              Medicines:                Monitored Anesthesia Care Complications:            No immediate complications. Estimated Blood Loss:     Estimated blood loss was minimal. Procedure:                Pre-Anesthesia Assessment:                           - Prior to the procedure, a History and Physical                            was performed, and patient medications and                            allergies were reviewed. The patient's tolerance of                            previous anesthesia was also reviewed. The risks                            and benefits of the procedure and the sedation                            options and risks were discussed with the patient.                            All questions were answered, and informed consent                            was obtained. Prior Anticoagulants: The patient has                            taken no previous anticoagulant or antiplatelet                            agents. ASA Grade Assessment: II - A patient with  mild systemic disease. After reviewing the risks                            and benefits, the patient was deemed in                            satisfactory condition to undergo the procedure.                           - Prior to the procedure, a History and Physical                            was performed, and patient medications and                     allergies were reviewed. The patient's tolerance of                            previous anesthesia was also reviewed. The risks                            and benefits of the procedure and the sedation                            options and risks were discussed with the patient.                            All questions were answered, and informed consent                            was obtained. [ASA Grade]. After reviewing the                            risks and benefits, the patient was deemed in                            satisfactory condition to undergo the procedure.                           After obtaining informed consent, the endoscope was                            passed under direct vision. Throughout the                            procedure, the patient's blood pressure, pulse, and                            oxygen saturations were monitored continuously. The                            [id number]) scope was introduced through the  mouth, and advanced to the second part of duodenum.                            The upper GI endoscopy was accomplished without                            difficulty. The patient tolerated the procedure                            well. Scope In: 7:44:22 AM Scope Out: 7:52:11 AM Total Procedure Duration: 0 hours 7 minutes 49 seconds  Findings:      A moderate Schatzki ring (acquired) was found at the gastroesophageal       junction.      The exam of the esophagus was otherwise normal.      A medium-sized hiatal hernia was present.      Diffuse moderately erythematous mucosa was found in the stomach. This       was biopsied with a cold forceps for histology. Estimated blood loss:       none.      The second portion of the duodenum was normal. Impression:               - Moderate Schatzki ring - not manipulated.                           - Medium-sized hiatal hernia.                           -  Erythematous mucosa in the stomach. Biopsied.                           - Normal second portion of the duodenum. Moderate Sedation:      Moderate (conscious) sedation was administered by the endoscopy nurse       and supervised by the endoscopist. The following parameters were       monitored: oxygen saturation, heart rate, blood pressure, respiratory       rate, EKG, adequacy of pulmonary ventilation, and response to care.       Total physician intraservice time was 17 minutes.      Moderate (conscious) sedation was personally administered by an       anesthesia professional. The following parameters were monitored: oxygen       saturation, heart rate, blood pressure, respiratory rate, EKG, adequacy       of pulmonary ventilation, and response to care. Total physician       intraservice time was 55 minutes. Recommendation:           - Patient has a contact number available for                            emergencies. The signs and symptoms of potential                            delayed complications were discussed with the                            patient. Return to normal activities  tomorrow.                            Written discharge instructions were provided to the                            patient.                           - Advance diet as tolerated.                           - Continue present medications.                           - Await pathology results.                           - No repeat upper endoscopy.                           - Return to GI office (date not yet determined).                            see colonoscopy report Procedure Code(s):        --- Professional ---                           [phone], Esophagogastroduodenoscopy, flexible,                            transoral; with biopsy, single or multiple                           [id number], Moderate sedation services provided by the                            same physician or other qualified health care                             professional performing the diagnostic or                            therapeutic service that the sedation supports,                            requiring the presence of an independent trained                            observer to assist in the monitoring of the                            patient's level of consciousness and physiological                            status; initial 15 minutes of intraservice time,  patient age 33 years or older Diagnosis Code(s):        --- Professional ---                           K22.2, Esophageal obstruction                           K44.9, Diaphragmatic hernia without obstruction or                            gangrene                           K31.89, Other diseases of stomach and duodenum                           R10.13, Epigastric pain                           R12, Heartburn                           R11.2, Nausea with vomiting, unspecified                           R63.4, Abnormal weight loss CPT copyright 2016 American Medical Association. All rights reserved. The codes documented in this report are preliminary and upon coder review may  be revised to meet current compliance requirements. Cristopher Estimable. Gerry Blanchfield, MD Norvel Richards, MD 09/06/2015 8:26:07 AM This report has been signed electronically. Number of Addenda: 0

## 2015-09-06 NOTE — Interval H&P Note (Signed)
History and Physical Interval Note:  09/06/2015 7:27 AM  Destiny Villanueva  has presented today for surgery, with the diagnosis of GERD, history of colon polyps, rectal bleeding, abdominal pain, nausea, vomiting, weight loss,  The various methods of treatment have been discussed with the patient and family. After consideration of risks, benefits and other options for treatment, the patient has consented to  Procedure(s) with comments: COLONOSCOPY WITH PROPOFOL (N/A) - 0730 ESOPHAGOGASTRODUODENOSCOPY (EGD) WITH PROPOFOL (N/A) as a surgical intervention .  The patient's history has been reviewed, patient examined, no change in status, stable for surgery.  I have reviewed the patient's chart and labs.  Questions were answered to the patient's satisfaction.     Robert Rourk  No change; EGD and TCS; no dysphagia . The risks, benefits, limitations, imponderables and alternatives regarding both EGD and colonoscopy have been reviewed with the patient. Questions have been answered. All parties agreeable.

## 2015-09-06 NOTE — Progress Notes (Signed)
Phenergan 6.25mg  given IV per order from Dr Patsey Berthold.  EPIC would not let oder take place for documentation.

## 2015-09-06 NOTE — Anesthesia Postprocedure Evaluation (Signed)
Anesthesia Post Note  Patient: Destiny Villanueva  Procedure(s) Performed: Procedure(s) (LRB): COLONOSCOPY WITH PROPOFOL (N/A) ESOPHAGOGASTRODUODENOSCOPY (EGD) WITH PROPOFOL (N/A)  Patient location during evaluation: PACU Anesthesia Type: MAC Level of consciousness: awake, oriented and patient cooperative Pain management: pain level controlled Vital Signs Assessment: post-procedure vital signs reviewed and stable Respiratory status: spontaneous breathing, nonlabored ventilation and respiratory function stable Cardiovascular status: blood pressure returned to baseline Postop Assessment: no signs of nausea or vomiting Anesthetic complications: no    Last Vitals:  Filed Vitals:   09/06/15 0720 09/06/15 0725  BP: 102/59   Temp:    Resp: 16 25    Last Pain: There were no vitals filed for this visit.               [name] J
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
## 2015-09-06 NOTE — Op Note (Signed)
[facility] Patient Name: Destiny Villanueva Procedure Date: 09/06/2015 7:54 AM MRN: [id number] Date of Birth: 27-Mar-1961 Attending MD: Norvel Richards , MD CSN: [id number] Age: 55 Admit Type: Outpatient Procedure:                Colonoscopy with snare polypectomy Indications:              Hematocheziacine: History of colonic polyps Providers:                Norvel Richards, MD, Gwenlyn Fudge, RN, Isabella Stalling, Technician Referring MD:              Medicines:                Monitored Anesthesia Care Complications:            No immediate complications. Estimated Blood Loss:     Estimated blood loss was minimal. Procedure:                Pre-Anesthesia Assessment:                           - Prior to the procedure, a History and Physical                            was performed, and patient medications and                            allergies were reviewed. The patient's tolerance of                            previous anesthesia was also reviewed. The risks                            and benefits of the procedure and the sedation                            options and risks were discussed with the patient.                            All questions were answered, and informed consent                            was obtained. Prior Anticoagulants: The patient has                            taken no previous anticoagulant or antiplatelet                            agents. ASA Grade Assessment: II - A patient with                            mild systemic disease. After reviewing the risks  and benefits, the patient was deemed in                            satisfactory condition to undergo the procedure.                           After obtaining informed consent, the colonoscope                            was passed under direct vision. Throughout the                            procedure, the patient's blood pressure, pulse,  and                            oxygen saturations were monitored continuously. The                            [id number] scope was introduced through                            the anus and advanced to the the cecum, identified                            by appendiceal orifice and ileocecal valve. The                            colonoscopy was performed without difficulty. The                            patient tolerated the procedure well. The quality                            of the bowel preparation was adequate. The                            ileocecal valve, appendiceal orifice, and rectum                            were photographed. The ileocecal valve, appendiceal                            orifice, and rectum were photographed. The entire                            colon was well visualized. Scope In: 7:57:57 AM Scope Out: 8:20:52 AM Scope Withdrawal Time: 0 hours 14 minutes 53 seconds  Total Procedure Duration: 0 hours 22 minutes 55 seconds  Findings:      The perianal and digital rectal examinations were normal.      Three pedunculated polyps were found in the ascending colon. The polyps       were 4 to 7 mm in size. These polyps were removed with hot snare and       cold snare. Resection and retrieval were  complete. Marland Kitchen Resection and       retrieval were complete. Estimated blood loss was minimal.      Internal hemorrhoids were found during retroflexion. The hemorrhoids       were Grade I (internal hemorrhoids that do not prolapse). Estimated       blood loss was minimal.      The exam was otherwise without abnormality on direct and retroflexion       views. Impression:               - Three 4 to 7 mm polyps in the ascending colon,                            removed with a hot snare and removed with a cold                            snare. Resected and retrieved.                           - Internal hemorrhoids.                           - The examination was  otherwise normal on direct                            and retroflexion views. Moderate Sedation:      Moderate (conscious) sedation was personally administered by an       anesthesia professional. [Parameters Monitored]. [Procedure Duration       Time]. Recommendation:           - Patient has a contact number available for                            emergencies. The signs and symptoms of potential                            delayed complications were discussed with the                            patient. Return to normal activities tomorrow.                            Written discharge instructions were provided to the                            patient.                           - Advance diet as tolerated.                           - Continue present medications.                           - Await pathology results.                           -  Return to GI clinic in 6 weeks. See EGD report. I                            suspect rectal bleeding secondary to hemorrhoids.                            May be a reasonably good hemorrhoid banding                            candidate..                           - Repeat colonoscopy date to be determined after                            pending pathology results are reviewed for                            surveillance based on pathology results. Procedure Code(s):        --- Professional ---                           [phone], Colonoscopy, flexible; with removal of                            tumor(s), polyp(s), or other lesion(s) by snare                            technique Diagnosis Code(s):        --- Professional ---                           D12.2, Benign neoplasm of ascending colon                           K64.0, First degree hemorrhoids                           K92.1, Melena (includes Hematochezia) CPT copyright 2016 American Medical Association. All rights reserved. The codes documented in this report are preliminary and upon coder review  may  be revised to meet current compliance requirements. Cristopher Estimable. Latrease Kunde, MD Norvel Richards, MD 09/06/2015 8:34:15 AM This report has been signed electronically. Number of Addenda: 0

## 2015-09-06 NOTE — Transfer of Care (Signed)
Immediate Anesthesia Transfer of Care Note  Patient: Destiny Villanueva  Procedure(s) Performed: Procedure(s) with comments: COLONOSCOPY WITH PROPOFOL (N/A) - 0730 ESOPHAGOGASTRODUODENOSCOPY (EGD) WITH PROPOFOL (N/A)  Patient Location: PACU  Anesthesia Type:MAC  Level of Consciousness: awake, alert , oriented and patient cooperative  Airway & Oxygen Therapy: Patient Spontanous Breathing and Patient connected to face mask oxygen  Post-op Assessment: Report given to RN, Post -op Vital signs reviewed and stable and Patient moving all extremities  Post vital signs: Reviewed and stable  Last Vitals:  Filed Vitals:   09/06/15 0720 09/06/15 0725  BP: 102/59   Temp:    Resp: 16 25    Last Pain: There were no vitals filed for this visit.    Patients Stated Pain Goal: 6 ([id number])  Complications: No apparent anesthesia complications

## 2015-09-06 NOTE — Addendum Note (Signed)
Addendum  created 09/06/15 [id number] by Charmaine Downs, CRNA   Modules edited: Anesthesia Flowsheet

## 2015-09-09 ENCOUNTER — Encounter: Payer: Self-pay | Admitting: Internal Medicine

## 2015-09-10 ENCOUNTER — Encounter (HOSPITAL_COMMUNITY): Payer: Self-pay | Admitting: Internal Medicine

## 2015-09-19 ENCOUNTER — Encounter: Payer: Self-pay | Admitting: Internal Medicine

## 2015-10-02 ENCOUNTER — Other Ambulatory Visit: Payer: Self-pay | Admitting: Student

## 2015-10-02 DIAGNOSIS — D696 Thrombocytopenia, unspecified: Secondary | ICD-10-CM

## 2015-10-04 LAB — CBC WITH DIFFERENTIAL/PLATELET
BASOS ABS: 40 {cells}/uL (ref 0–200)
Basophils Relative: 1 %
EOS PCT: 2 %
Eosinophils Absolute: 80 cells/uL (ref 15–500)
HCT: 38 % (ref 35.0–45.0)
HEMOGLOBIN: 13 g/dL (ref 11.7–15.5)
LYMPHS ABS: 1480 {cells}/uL (ref 850–3900)
LYMPHS PCT: 37 %
MCH: 29.4 pg (ref 27.0–33.0)
MCHC: 34.2 g/dL (ref 32.0–36.0)
MCV: 86 fL (ref 80.0–100.0)
MONOS PCT: 8 %
MPV: 9.6 fL (ref 7.5–12.5)
Monocytes Absolute: 320 cells/uL (ref 200–950)
NEUTROS PCT: 52 %
Neutro Abs: 2080 cells/uL (ref 1500–7800)
Platelets: 112 10*3/uL — ABNORMAL LOW (ref 140–400)
RBC: 4.42 MIL/uL (ref 3.80–5.10)
RDW: 14.7 % (ref 11.0–15.0)
WBC: 4 10*3/uL (ref 3.8–10.8)

## 2015-10-10 ENCOUNTER — Encounter: Payer: Self-pay | Admitting: Gastroenterology

## 2015-10-10 ENCOUNTER — Ambulatory Visit (INDEPENDENT_AMBULATORY_CARE_PROVIDER_SITE_OTHER): Payer: Self-pay | Admitting: Gastroenterology

## 2015-10-10 VITALS — BP 102/67 | HR 81 | Temp 98.6°F | Ht 66.0 in | Wt 162.6 lb

## 2015-10-10 DIAGNOSIS — K3189 Other diseases of stomach and duodenum: Secondary | ICD-10-CM

## 2015-10-10 DIAGNOSIS — R1033 Periumbilical pain: Secondary | ICD-10-CM

## 2015-10-10 DIAGNOSIS — R161 Splenomegaly, not elsewhere classified: Secondary | ICD-10-CM

## 2015-10-10 DIAGNOSIS — K219 Gastro-esophageal reflux disease without esophagitis: Secondary | ICD-10-CM

## 2015-10-10 DIAGNOSIS — R198 Other specified symptoms and signs involving the digestive system and abdomen: Secondary | ICD-10-CM
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
## 2015-10-10 NOTE — Progress Notes (Signed)
cc'ed to pcp °

## 2015-10-10 NOTE — Assessment & Plan Note (Signed)
Likely IBS. Self-limiting symptoms at this time. Tenderness right at the umbilicus with palpable thickness, needs further evaluation. CT of abdomen and pelvis with contrast.

## 2015-10-10 NOTE — Patient Instructions (Signed)
1. Resume Dexilant once daily. Samples provided. You paperwork is being processed for patient assistance. 2. CT of the abdomen to further evaluate your spleen, liver, abdominal pain. We will call you with results when available, usually within 5 business days.

## 2015-10-10 NOTE — Assessment & Plan Note (Signed)
History of splenomegaly based on ultrasound and CT previously. Has not been evaluated in over 4 years. Mild thrombocytopenia. Reevaluate at time of CT. No evidence of cirrhosis at time of EGD as far as portal gastropathy or varices. Etiology unknown at this time. She states she's never seen a hematologist.

## 2015-10-10 NOTE — Assessment & Plan Note (Signed)
Ongoing upper abdominal discomfort/burning, intermittent vomiting in the setting of untreated GERD, gastritis. Samples of Dexilant provided today. Patient assistance forms pending for Dexilant.

## 2015-10-11 ENCOUNTER — Encounter: Payer: Self-pay | Admitting: Physician Assistant

## 2015-10-11 ENCOUNTER — Ambulatory Visit: Payer: Self-pay | Admitting: Physician Assistant

## 2015-10-11 VITALS — BP 102/64 | HR 91 | Temp 97.7°F | Ht 66.0 in | Wt 165.6 lb

## 2015-10-11 DIAGNOSIS — F17219 Nicotine dependence, cigarettes, with unspecified nicotine-induced disorders: Secondary | ICD-10-CM

## 2015-10-11 DIAGNOSIS — D696 Thrombocytopenia, unspecified: Secondary | ICD-10-CM

## 2015-10-11 DIAGNOSIS — J449 Chronic obstructive pulmonary disease, unspecified: Secondary | ICD-10-CM

## 2015-10-11 DIAGNOSIS — F419 Anxiety disorder, unspecified: Secondary | ICD-10-CM

## 2015-10-11 DIAGNOSIS — R161 Splenomegaly, not elsewhere classified: Secondary | ICD-10-CM

## 2015-10-11 NOTE — Progress Notes (Signed)
BP 102/64 mmHg  Pulse 91  Temp(Src) 97.7 F (36.5 C)  Ht _0  (1.676 m)  Wt 165 lb 9.6 oz (75.116 kg)  BMI 26.74 kg/m2  SpO2 97%   Subjective:    Patient ID: Destiny Villanueva, female    DOB: 17-Nov-1960, 55 y.o.   MRN: [id number]  HPI: Destiny Villanueva is a 55 y.o. female presenting on 10/11/2015 for COPD   HPI   Pt is still having a lot of anxiety and depression.  [facility] not [facility].  She is taking  lexapro- she says dr Woody Seller gave her a year supply.    Pt states 2 mm calcified place in kidney on xray of lumbar spine done by disability office.  Pt has CT ordered by GI for evaluation of abd pain.  It will show her kidney.    Pt is trying to get disability  Pt is seeing GI for abdominal pain.  Relevant past medical, surgical, family and social history reviewed and updated as indicated. Interim medical history since our last visit reviewed. Allergies and medications reviewed and updated.   Current outpatient prescriptions:  .  albuterol (PROVENTIL HFA;VENTOLIN HFA) 108 (90 Base) MCG/ACT inhaler, Inhale 2 puffs into the lungs every 6 (six) hours as needed for wheezing or shortness of breath., Disp: 3 Inhaler, Rfl: 0 .  albuterol (PROVENTIL) (2.5 MG/3ML) 0.083% nebulizer solution, Take 3 mLs (2.5 mg total) by nebulization every 6 (six) hours as needed for wheezing or shortness of breath., Disp: 75 mL, Rfl: 12 .  Cholecalciferol (VITAMIN D3) 5000 units CAPS, Take 1 capsule by mouth daily., Disp: , Rfl:  .  dexlansoprazole (DEXILANT) 60 MG capsule, Take 60 mg by mouth daily., Disp: , Rfl:  .  diphenhydrAMINE (BENADRYL) 50 MG tablet, Take 1 tablet (50 mg total) by mouth at bedtime., Disp: , Rfl:  .  escitalopram (LEXAPRO) 20 MG tablet, Take 20 mg by mouth daily., Disp: , Rfl:  .  fluticasone (FLONASE) 50 MCG/ACT nasal spray, Place into both nostrils daily as needed for allergies or rhinitis., Disp: , Rfl:  .  Fluticasone Furoate-Vilanterol (BREO ELLIPTA) 100-25 MCG/INH  AEPB, Inhale 1 puff into the lungs daily., Disp: 1 each, Rfl: 5   Review of Systems  Constitutional: Positive for fatigue. Negative for fever, chills, appetite change and unexpected weight change.  HENT: Positive for congestion and sneezing. Negative for dental problem, drooling, ear pain, facial swelling, hearing loss, mouth sores, sore throat, trouble swallowing and voice change.   Eyes: Negative for pain, discharge, redness, itching and visual disturbance.  Respiratory: Positive for cough, chest tightness and shortness of breath. Negative for choking and wheezing.   Cardiovascular: Positive for leg swelling. Negative for chest pain and palpitations.  Gastrointestinal: Positive for abdominal pain, diarrhea and constipation. Negative for vomiting and blood in stool.  Endocrine: Positive for heat intolerance. Negative for cold intolerance and polydipsia.  Genitourinary: Negative for dysuria, hematuria and decreased urine volume.  Musculoskeletal: Positive for back pain, arthralgias and gait problem.  Skin: Negative for rash.  Allergic/Immunologic: Positive for environmental allergies.  Neurological: Negative for seizures, syncope, light-headedness and headaches.  Hematological: Negative for adenopathy.  Psychiatric/Behavioral: Positive for dysphoric mood and agitation. Negative for suicidal ideas. The patient is nervous/anxious.     Per HPI unless specifically indicated above     Objective:    BP 102/64 mmHg  Pulse 91  Temp(Src) 97.7 F (36.5 C)  Ht _1  (1.676 m)  Wt  165 lb 9.6 oz (75.116 kg)  BMI 26.74 kg/m2  SpO2 97%  Wt Readings from Last 3 Encounters:  10/11/15 165 lb 9.6 oz (75.116 kg)  10/10/15 162 lb 9.6 oz (73.755 kg)  08/29/15 164 lb (74.39 kg)    Physical Exam  Constitutional: She is oriented to person, place, and time. She appears well-developed and well-nourished.  HENT:  Head: Normocephalic and atraumatic.  Neck: Neck supple.  Cardiovascular: Normal rate and  regular rhythm.   Pulmonary/Chest: Effort normal and breath sounds normal.  Abdominal: Soft. Bowel sounds are normal. She exhibits no mass. There is no hepatosplenomegaly. There is no tenderness.  Musculoskeletal: She exhibits no edema.  Lymphadenopathy:    She has no cervical adenopathy.  Neurological: She is alert and oriented to person, place, and time.  Skin: Skin is warm and dry.  Psychiatric: She has a normal mood and affect. Her behavior is normal.  Vitals reviewed.   Results for orders placed or performed in visit on 10/02/15  CBC w/Diff/Platelet  Result Value Ref Range   WBC 4.0 3.8 - 10.8 K/uL   RBC 4.42 3.80 - 5.10 MIL/uL   Hemoglobin 13.0 11.7 - 15.5 g/dL   HCT 38.0 35.0 - 45.0 %   MCV 86.0 80.0 - 100.0 fL   MCH 29.4 27.0 - 33.0 pg   MCHC 34.2 32.0 - 36.0 g/dL   RDW 14.7 11.0 - 15.0 %   Platelets 112 (L) 140 - 400 K/uL   MPV 9.6 7.5 - 12.5 fL   Neutro Abs 2080 1500 - 7800 cells/uL   Lymphs Abs 1480 850 - 3900 cells/uL   Monocytes Absolute 320 200 - 950 cells/uL   Eosinophils Absolute 80 15 - 500 cells/uL   Basophils Absolute 40 0 - 200 cells/uL   Neutrophils Relative % 52 %   Lymphocytes Relative 37 %   Monocytes Relative 8 %   Eosinophils Relative 2 %   Basophils Relative 1 %   Smear Review Criteria for review not met       Assessment & Plan:   Encounter Diagnoses  Name Primary?  . Chronic obstructive pulmonary disease, unspecified COPD type ([place]) Yes  . Cigarette nicotine dependence with nicotine-induced disorder   . Anxiety   . Spleen enlarged   . Thrombocytopenia ([place])     -Reviewed labs with pt. She is aware her platelet low due to enlargeed spleen -urged pt to [facility] -CT as ordered by GI -Counseled smoking cessation -Continue current rx -F/u 3 months. RTO sooner prn

## 2015-10-11 NOTE — Patient Instructions (Signed)

## 2015-10-12 DIAGNOSIS — D696 Thrombocytopenia, unspecified: Secondary | ICD-10-CM

## 2015-10-12 HISTORY — DX: Thrombocytopenia, unspecified: D69.6

## 2015-10-16 ENCOUNTER — Ambulatory Visit (HOSPITAL_COMMUNITY)
Admission: RE | Admit: 2015-10-16 | Discharge: 2015-10-16 | Disposition: A | Discharge status: Home / Self Care | Payer: Self-pay | Source: Ambulatory Visit | Attending: Gastroenterology | Admitting: Gastroenterology

## 2015-10-16 DIAGNOSIS — N200 Calculus of kidney: Secondary | ICD-10-CM | POA: Insufficient documentation

## 2015-10-16 DIAGNOSIS — R1033 Periumbilical pain: Secondary | ICD-10-CM | POA: Insufficient documentation

## 2015-10-16 DIAGNOSIS — R161 Splenomegaly, not elsewhere classified: Secondary | ICD-10-CM | POA: Insufficient documentation

## 2015-10-16 MED ORDER — DIATRIZOATE MEGLUMINE & SODIUM 66-10 % PO SOLN
ORAL | Status: AC
  Filled 2015-10-16: qty 30

## 2015-10-16 MED ORDER — IOPAMIDOL (ISOVUE-300) INJECTION 61%
100.0000 mL | Freq: Once | INTRAVENOUS | Status: AC | PRN
  Administered 2015-10-16: 100 mL via INTRAVENOUS

## 2015-10-22 NOTE — Progress Notes (Signed)
Quick Note:  LMOM to call. ______ 

## 2015-10-24 NOTE — Progress Notes (Signed)
Quick Note:  Pt is aware and will wait for Leslie's recommendations. ______

## 2015-10-26 NOTE — Progress Notes (Signed)
Quick Note:  Stable mild splenomegaly and chronic thrombocytopenia without evidence of cirrhosis on CT or EGD (ie no portal gastropathy or varices).  Nothing found to explain hardness right of umbilicus.   I would recommend her see hematologist for chronic thrombocytopenia and splenomegaly.  Return OV in 6 months with RMR. ______

## 2015-11-01 ENCOUNTER — Other Ambulatory Visit: Payer: Self-pay

## 2015-11-01 DIAGNOSIS — D696 Thrombocytopenia, unspecified: Secondary | ICD-10-CM

## 2015-11-06 ENCOUNTER — Telehealth (HOSPITAL_COMMUNITY): Payer: Self-pay | Admitting: *Deleted

## 2015-11-06 NOTE — Telephone Encounter (Signed)
Error

## 2015-12-12 ENCOUNTER — Encounter (HOSPITAL_COMMUNITY): Payer: Self-pay | Attending: Hematology & Oncology | Admitting: Hematology & Oncology

## 2015-12-12 ENCOUNTER — Encounter (HOSPITAL_COMMUNITY): Payer: Self-pay | Admitting: Hematology & Oncology

## 2015-12-12 ENCOUNTER — Encounter (HOSPITAL_COMMUNITY): Payer: Self-pay

## 2015-12-12 VITALS — BP 95/57 | HR 81 | Temp 98.7°F | Resp 16 | Ht 65.5 in | Wt 159.2 lb

## 2015-12-12 DIAGNOSIS — R161 Splenomegaly, not elsewhere classified: Secondary | ICD-10-CM

## 2015-12-12 DIAGNOSIS — K760 Fatty (change of) liver, not elsewhere classified: Secondary | ICD-10-CM

## 2015-12-12 DIAGNOSIS — D696 Thrombocytopenia, unspecified: Secondary | ICD-10-CM | POA: Insufficient documentation

## 2015-12-12 LAB — CBC WITH DIFFERENTIAL/PLATELET
BASOS ABS: 0 10*3/uL (ref 0.0–0.1)
Basophils Relative: 0 %
Eosinophils Absolute: 0.1 10*3/uL (ref 0.0–0.7)
Eosinophils Relative: 1 %
HEMATOCRIT: 41.4 % (ref 36.0–46.0)
Hemoglobin: 13.7 g/dL (ref 12.0–15.0)
LYMPHS ABS: 1.7 10*3/uL (ref 0.7–4.0)
LYMPHS PCT: 35 %
MCH: 28.6 pg (ref 26.0–34.0)
MCHC: 33.1 g/dL (ref 30.0–36.0)
MCV: 86.4 fL (ref 78.0–100.0)
MONO ABS: 0.3 10*3/uL (ref 0.1–1.0)
MONOS PCT: 6 %
NEUTROS ABS: 2.8 10*3/uL (ref 1.7–7.7)
Neutrophils Relative %: 58 %
Platelets: 133 10*3/uL — ABNORMAL LOW (ref 150–400)
RBC: 4.79 MIL/uL (ref 3.87–5.11)
RDW: 13.2 % (ref 11.5–15.5)
WBC: 4.9 10*3/uL (ref 4.0–10.5)

## 2015-12-12 LAB — FERRITIN: Ferritin: 50 ng/mL (ref 11–307)
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
## 2015-12-12 NOTE — Patient Instructions (Addendum)
[place] at [facility] Discharge Instructions  RECOMMENDATIONS MADE BY THE CONSULTANT AND ANY TEST RESULTS WILL BE SENT TO YOUR REFERRING PHYSICIAN.  You saw Dr. Whitney Muse today. Labs today. Follow up in 3 months with labs.  Thank you for choosing [facility] at [facility] to provide your oncology and hematology care.  To afford each patient quality time with our provider, please arrive at least 15 minutes before your scheduled appointment time.   Beginning January 23rd 2017 lab work for the [facility] will be done in the  Main lab at [facility] on 1st floor. If you have a lab appointment with the [facility] please come in thru the  Main Entrance and check in at the main information desk  You need to re-schedule your appointment should you arrive 10 or more minutes late.  We strive to give you quality time with our providers, and arriving late affects you and other patients whose appointments are after yours.  Also, if you no show three or more times for appointments you may be dismissed from the clinic at the providers discretion.     Again, thank you for choosing [facility].  Our hope is that these requests will decrease the amount of time that you wait before being seen by our physicians.       _____________________________________________________________  Should you have questions after your visit to [facility], please contact our office at [phone] between the hours of 8:30 a.m. and 4:30 p.m.  Voicemails left after 4:30 p.m. will not be returned until the following business day.  For prescription refill requests, have your pharmacy contact our office.         Resources For Cancer Patients and their Caregivers ? American Cancer Society: Can assist with transportation, wigs, general needs, runs Look Good Feel Better.        (403) 777-0401 ? Cancer Care: Provides financial assistance, online  support groups, medication/co-pay assistance.  1-800-813-HOPE (249)188-6226) ? [facility] Assists [place] cancer patients and their families through emotional , educational and financial support.  3147210659 ? [place] DSS Where to apply for food stamps, Medicaid and utility assistance. (228) 003-5096 ? [facility]: Transportation to medical appointments. 7747109916 ? Social Security Administration: May apply for disability if have a Stage IV cancer. 807-741-9400 732 841 2557 ? [employer], Disability and Transit Services: Assists with nutrition, care and transit needs. [place] Support Programs: @10RELATIVEDAYS @ > Cancer Support Group  2nd Tuesday of the month 1pm-2pm, Journey Room  > Creative Journey  3rd Tuesday of the month 1130am-1pm, Journey Room  > Look Good Feel Better  1st Wednesday of the month 10am-12 noon, Journey Room (Call [facility] to register [phone])

## 2015-12-14 LAB — PATHOLOGIST SMEAR REVIEW

## 2015-12-14 LAB — ZINC: ZINC: 94 ug/dL (ref 56–134)

## 2015-12-18 ENCOUNTER — Telehealth: Payer: Self-pay | Admitting: Pulmonary Disease

## 2015-12-18 NOTE — Telephone Encounter (Signed)
Called spoke with pt. Appointment scheduled for tomorrow at 9:30 with NP. Nothing further needed

## 2015-12-18 NOTE — Telephone Encounter (Signed)
Pt returning call and can be reached @ [phone].Destiny Villanueva

## 2015-12-18 NOTE — Telephone Encounter (Signed)
Appointment Request From: Destiny Villanueva    With Provider: Chesley Mires, MD [facility]]    Preferred Date Range: From 12/21/2015 To 01/18/2016    Preferred Times: Any    Reason for visit: Office Visit    Comments:  My coughing is getting worse and I would like an appointment and a ct or mri of my lungs.

## 2015-12-18 NOTE — Telephone Encounter (Signed)
Attempted to contact patient, left message for patient to return call.

## 2015-12-19 ENCOUNTER — Ambulatory Visit (INDEPENDENT_AMBULATORY_CARE_PROVIDER_SITE_OTHER)
Admission: RE | Admit: 2015-12-19 | Discharge: 2015-12-19 | Disposition: A | Discharge status: Home / Self Care | Payer: Self-pay | Source: Ambulatory Visit | Attending: Pulmonary Disease | Admitting: Pulmonary Disease

## 2015-12-19 ENCOUNTER — Ambulatory Visit: Payer: Self-pay | Admitting: Adult Health

## 2015-12-19 ENCOUNTER — Ambulatory Visit (INDEPENDENT_AMBULATORY_CARE_PROVIDER_SITE_OTHER): Payer: Self-pay | Admitting: Pulmonary Disease

## 2015-12-19 ENCOUNTER — Encounter: Payer: Self-pay | Admitting: Pulmonary Disease

## 2015-12-19 VITALS — BP 116/70 | HR 80 | Ht 65.5 in | Wt 159.6 lb

## 2015-12-19 DIAGNOSIS — J410 Simple chronic bronchitis: Secondary | ICD-10-CM

## 2015-12-19 DIAGNOSIS — J309 Allergic rhinitis, unspecified: Secondary | ICD-10-CM

## 2015-12-19 DIAGNOSIS — R059 Cough, unspecified: Secondary | ICD-10-CM

## 2015-12-19 DIAGNOSIS — R05 Cough: Secondary | ICD-10-CM

## 2015-12-19 DIAGNOSIS — K219 Gastro-esophageal reflux disease without esophagitis: Secondary | ICD-10-CM

## 2015-12-19 MED ORDER — BENZONATATE 200 MG PO CAPS: 200.0000 mg | ORAL_CAPSULE | Freq: Three times a day (TID) | ORAL | 1 refills | Status: DC | PRN

## 2015-12-19 NOTE — Progress Notes (Signed)
Subjective:    Patient ID: Destiny Villanueva, female    DOB: [birth year], 55 y.o.   MRN: [id number]  HPI  Chief Complaint  Patient presents with  . Acute Visit    VS pt being treated for emphysema and chronic cough c/o worsening "coughing spells"- prod cough with clear/white mucus, coughs to point of dizziness.  S/s worse Xapprox 2 mos.     Destiny Villanueva is Dr. Juanetta Gosling patient who has upper airway cough syndrome and emphysema.  She notes that the cough has been worsening lately.  The cough will come out of the blue with no warning sign, no throat itching or tingling.  It will happen more frequently than when she last saw Dr. Halford Chessman.  It will happen sporadically.  She will cough to the point that she feels very lightheaded frequently.  She actually passed out a few times in the past, but lately she just gets really light headed and feels like she is going to black out.    + mucus production  + heartburn on dexilant  She has been having more dyspnea as well.   She notes that she started smoking 1ppd 2-3 months ago after having quit for 6-12 months prior.    She is worried she may have lung cancer.    Past Medical History:  Diagnosis Date  . Anxiety   . Arthritis   . Colon polyps 2012  . Depression   . DJD (degenerative joint disease), cervical   . Emphysema/COPD ([place])   . Fatty liver   . GERD (gastroesophageal reflux disease)   . Seasonal allergies   . Sleep apnea    dx 2015  . Spinal stenosis   . Spleen enlarged   . Vulvar lesion      Review of Systems  Constitutional: Negative for chills, fatigue and fever.  HENT: Positive for postnasal drip and rhinorrhea. Negative for sinus pressure.   Respiratory: Positive for cough and shortness of breath. Negative for wheezing.   Cardiovascular: Negative for chest pain, palpitations and leg swelling.       Objective:   Physical Exam Vitals:   12/19/15 1023  BP: 116/70  Pulse: 80  SpO2: 100%  Weight: 159 lb 9.6 oz (72.4 kg)    Height: 5' 5.5" (1.664 m)  RA  Gen: well appearing HENT: OP clear, TM's clear, neck supple PULM: CTA B, normal percussion CV: RRR, no mgr, trace edema GI: BS+, soft, nontender Derm: no cyanosis or rash Psyche: normal mood and affect  Records reviewed were she was treated for postnasal drip, acid reflux and recurrent cough in addition to her emphysema  Chest x-ray last performed December 2016 showed no acute abnormality      Assessment & Plan:   Impression: Upper airway cough syndrome > worse COPD Tobacco use Postnasal drip Acid reflux  Discussion: She has recurrent cough because she's decided to stop taking all the medications that Dr. Halford Chessman recommended for her upper airway cough syndrome and start smoking again.   Plan: Stop smoking Chest x-ray Resume Flonase Take Pepcid at night in addition to morning dose of proton pump inhibitor Follow-up 3-4 weeks with Dr. Halford Chessman   Current Outpatient Prescriptions:  .  albuterol (PROVENTIL HFA;VENTOLIN HFA) 108 (90 Base) MCG/ACT inhaler, Inhale 2 puffs into the lungs every 6 (six) hours as needed for wheezing or shortness of breath., Disp: 3 Inhaler, Rfl: 0 .  albuterol (PROVENTIL) (2.5 MG/3ML) 0.083% nebulizer solution, Take 3 mLs (2.5 mg total) by  nebulization every 6 (six) hours as needed for wheezing or shortness of breath., Disp: 75 mL, Rfl: 12 .  Cholecalciferol (VITAMIN D3) 5000 units CAPS, Take 1 capsule by mouth daily., Disp: , Rfl:  .  dexlansoprazole (DEXILANT) 60 MG capsule, Take 60 mg by mouth daily., Disp: , Rfl:  .  diphenhydrAMINE (BENADRYL) 50 MG tablet, Take 1 tablet (50 mg total) by mouth at bedtime., Disp: , Rfl:  .  escitalopram (LEXAPRO) 20 MG tablet, Take 20 mg by mouth daily., Disp: , Rfl:  .  fluticasone (FLONASE) 50 MCG/ACT nasal spray, Place into both nostrils daily as needed for allergies or rhinitis., Disp: , Rfl:  .  Fluticasone Furoate-Vilanterol (BREO ELLIPTA) 100-25 MCG/INH AEPB, Inhale 1 puff into the  lungs daily., Disp: 1 each, Rfl: 5

## 2015-12-19 NOTE — Patient Instructions (Signed)
Stop smoking Resume Flonase Take Pepcid at night in addition to your other antacid therapy which you take in the morning We will call you with the results of the chest x-ray Use Tessalon as needed for cough Follow-up with Dr. Halford Chessman 3-4 weeks

## 2015-12-31 NOTE — Progress Notes (Signed)
Called spoke with pt. Reviewed results and recs. Pt voiced understanding and had no further questions.

## 2016-01-10 ENCOUNTER — Ambulatory Visit: Payer: Self-pay | Admitting: Physician Assistant

## 2016-01-10 ENCOUNTER — Encounter: Payer: Self-pay | Admitting: Physician Assistant

## 2016-01-10 VITALS — BP 118/70 | HR 98 | Temp 97.9°F | Ht 65.5 in | Wt 161.4 lb

## 2016-01-10 DIAGNOSIS — F329 Major depressive disorder, single episode, unspecified: Secondary | ICD-10-CM

## 2016-01-10 DIAGNOSIS — R05 Cough: Secondary | ICD-10-CM

## 2016-01-10 DIAGNOSIS — J449 Chronic obstructive pulmonary disease, unspecified: Secondary | ICD-10-CM

## 2016-01-10 DIAGNOSIS — F32A Depression, unspecified: Secondary | ICD-10-CM

## 2016-01-10 DIAGNOSIS — F419 Anxiety disorder, unspecified: Secondary | ICD-10-CM

## 2016-01-10 DIAGNOSIS — K219 Gastro-esophageal reflux disease without esophagitis: Secondary | ICD-10-CM

## 2016-01-10 DIAGNOSIS — R059 Cough, unspecified: Secondary | ICD-10-CM

## 2016-01-10 DIAGNOSIS — F17219 Nicotine dependence, cigarettes, with unspecified nicotine-induced disorders: Secondary | ICD-10-CM

## 2016-01-10 DIAGNOSIS — D696 Thrombocytopenia, unspecified: Secondary | ICD-10-CM

## 2016-01-10 NOTE — Progress Notes (Signed)
BP 118/70 (BP Location: Left Arm, Patient Position: Sitting, Cuff Size: Normal)   Pulse 98   Temp 97.9 F (36.6 C) (Other (Comment))   Ht 5' 5.5" (1.664 m)   Wt 161 lb 6.4 oz (73.2 kg)   SpO2 97%   BMI 26.45 kg/m    Subjective:    Patient ID: Destiny Villanueva, female    DOB: [birth year], 55 y.o.   MRN: [id number]  HPI: Destiny Villanueva is a 55 y.o. female presenting on 01/10/2016 for COPD   HPI   Pt says she is [facility] today.  She has not been yet.   She has f/u with pulmonology next week.  F/u with hematologist in November.    She is still smoking.  She is still coughing.  Relevant past medical, surgical, family and social history reviewed and updated as indicated. Interim medical history since our last visit reviewed. Allergies and medications reviewed and updated.   Current Outpatient Prescriptions:  .  albuterol (PROVENTIL HFA;VENTOLIN HFA) 108 (90 Base) MCG/ACT inhaler, Inhale 2 puffs into the lungs every 6 (six) hours as needed for wheezing or shortness of breath., Disp: 3 Inhaler, Rfl: 0 .  albuterol (PROVENTIL) (2.5 MG/3ML) 0.083% nebulizer solution, Take 3 mLs (2.5 mg total) by nebulization every 6 (six) hours as needed for wheezing or shortness of breath., Disp: 75 mL, Rfl: 12 .  benzonatate (TESSALON) 200 MG capsule, Take 1 capsule (200 mg total) by mouth 3 (three) times daily as needed for cough., Disp: 45 capsule, Rfl: 1 .  dexlansoprazole (DEXILANT) 60 MG capsule, Take 60 mg by mouth daily., Disp: , Rfl:  .  escitalopram (LEXAPRO) 20 MG tablet, Take 20 mg by mouth daily., Disp: , Rfl:  .  fluticasone (FLONASE) 50 MCG/ACT nasal spray, Place into both nostrils daily as needed for allergies or rhinitis., Disp: , Rfl:  .  Fluticasone Furoate-Vilanterol (BREO ELLIPTA) 100-25 MCG/INH AEPB, Inhale 1 puff into the lungs daily., Disp: 1 each, Rfl: 5 .  Cholecalciferol (VITAMIN D3) 5000 units CAPS, Take 1 capsule by mouth daily., Disp: , Rfl:     Review of Systems  Constitutional: Positive for fatigue. Negative for appetite change, chills, diaphoresis, fever and unexpected weight change.  HENT: Positive for congestion. Negative for drooling, ear pain, facial swelling, hearing loss, mouth sores, sneezing, sore throat, trouble swallowing and voice change.   Eyes: Negative for pain, discharge, redness, itching and visual disturbance.  Respiratory: Positive for cough, shortness of breath and wheezing. Negative for choking.   Cardiovascular: Positive for chest pain (when coughing). Negative for palpitations and leg swelling.  Gastrointestinal: Positive for abdominal pain, constipation, diarrhea and vomiting (post-tussive emesis). Negative for blood in stool.  Endocrine: Negative for cold intolerance, heat intolerance and polydipsia.  Genitourinary: Negative for decreased urine volume, dysuria and hematuria.  Musculoskeletal: Positive for arthralgias, back pain and joint swelling. Negative for gait problem.  Skin: Positive for rash.  Allergic/Immunologic: Positive for environmental allergies.  Neurological: Negative for seizures, syncope, light-headedness and headaches.  Hematological: Negative for adenopathy.  Psychiatric/Behavioral: Positive for agitation and dysphoric mood. Negative for suicidal ideas. The patient is nervous/anxious.     Per HPI unless specifically indicated above     Objective:    BP 118/70 (BP Location: Left Arm, Patient Position: Sitting, Cuff Size: Normal)   Pulse 98   Temp 97.9 F (36.6 C) (Other (Comment))   Ht 5' 5.5" (1.664 m)   Wt 161 lb 6.4  oz (73.2 kg)   SpO2 97%   BMI 26.45 kg/m   Wt Readings from Last 3 Encounters:  01/10/16 161 lb 6.4 oz (73.2 kg)  12/19/15 159 lb 9.6 oz (72.4 kg)  12/12/15 159 lb 3.2 oz (72.2 kg)    Physical Exam  Constitutional: She is oriented to person, place, and time. She appears well-developed and well-nourished.  HENT:  Head: Normocephalic and atraumatic.   Neck: Neck supple.  Cardiovascular: Normal rate and regular rhythm.   Pulmonary/Chest: Effort normal. She has wheezes (scattered exp wheezes).  Abdominal: Soft. Bowel sounds are normal. She exhibits no mass. There is no hepatosplenomegaly. There is no tenderness.  Musculoskeletal: She exhibits no edema.  Lymphadenopathy:    She has no cervical adenopathy.  Neurological: She is alert and oriented to person, place, and time.  Skin: Skin is warm and dry.  Psychiatric: She has a normal mood and affect. Her behavior is normal.  Vitals reviewed.       Assessment & Plan:   Encounter Diagnoses  Name Primary?  . Cigarette nicotine dependence with nicotine-induced disorder Yes  . Chronic obstructive pulmonary disease, unspecified COPD type ([place])   . Cough   . Gastroesophageal reflux disease, esophagitis presence not specified   . Anxiety   . Depression   . Thrombocytopenia ([place])     -screening mammogram in December- will order at December appointment -Schedule for flu shot -Counseled smoking cessation -Continue with pulmonology, hematology and go to [facility]  -F/u 3 months. RTO sooner prn

## 2016-01-10 NOTE — Patient Instructions (Signed)
Go to [facility] Go to pulmonologist next week as scheduled  Smoking Cessation, Tips for Success If you are ready to quit smoking, congratulations! You have chosen to help yourself be healthier. Cigarettes bring nicotine, tar, carbon monoxide, and other irritants into your body. Your lungs, heart, and blood vessels will be able to work better without these poisons. There are many different ways to quit smoking. Nicotine gum, nicotine patches, a nicotine inhaler, or nicotine nasal spray can help with physical craving. Hypnosis, support groups, and medicines help break the habit of smoking. WHAT THINGS CAN I DO TO MAKE QUITTING EASIER?  Here are some tips to help you quit for good:  Pick a date when you will quit smoking completely. Tell all of your friends and family about your plan to quit on that date.  Do not try to slowly cut down on the number of cigarettes you are smoking. Pick a quit date and quit smoking completely starting on that day.  Throw away all cigarettes.   Clean and remove all ashtrays from your home, work, and car.  On a card, write down your reasons for quitting. Carry the card with you and read it when you get the urge to smoke.  Cleanse your body of nicotine. Drink enough water and fluids to keep your urine clear or pale yellow. Do this after quitting to flush the nicotine from your body.  Learn to predict your moods. Do not let a bad situation be your excuse to have a cigarette. Some situations in your life might tempt you into wanting a cigarette.  Never have "just one" cigarette. It leads to wanting another and another. Remind yourself of your decision to quit.  Change habits associated with smoking. If you smoked while driving or when feeling stressed, try other activities to replace smoking. Stand up when drinking your coffee. Brush your teeth after eating. Sit in a different chair when you read the paper. Avoid alcohol while trying to quit, and try to drink fewer  caffeinated beverages. Alcohol and caffeine may urge you to smoke.  Avoid foods and drinks that can trigger a desire to smoke, such as sugary or spicy foods and alcohol.  Ask people who smoke not to smoke around you.  Have something planned to do right after eating or having a cup of coffee. For example, plan to take a walk or exercise.  Try a relaxation exercise to calm you down and decrease your stress. Remember, you may be tense and nervous for the first 2 weeks after you quit, but this will pass.  Find new activities to keep your hands busy. Play with a pen, coin, or rubber band. Doodle or draw things on paper.  Brush your teeth right after eating. This will help cut down on the craving for the taste of tobacco after meals. You can also try mouthwash.   Use oral substitutes in place of cigarettes. Try using lemon drops, carrots, cinnamon sticks, or chewing gum. Keep them handy so they are available when you have the urge to smoke.  When you have the urge to smoke, try deep breathing.  Designate your home as a nonsmoking area.  If you are a heavy smoker, ask your health care provider about a prescription for nicotine chewing gum. It can ease your withdrawal from nicotine.  Reward yourself. Set aside the cigarette money you save and buy yourself something nice.  Look for support from others. Join a support group or smoking cessation program. Ask someone  at home or at work to help you with your plan to quit smoking.  Always ask yourself, "Do I need this cigarette or is this just a reflex?" Tell yourself, "Today, I choose not to smoke," or "I do not want to smoke." You are reminding yourself of your decision to quit.  Do not replace cigarette smoking with electronic cigarettes (commonly called e-cigarettes). The safety of e-cigarettes is unknown, and some may contain harmful chemicals.  If you relapse, do not give up! Plan ahead and think about what you will do the next time you get  the urge to smoke. HOW WILL I FEEL WHEN I QUIT SMOKING? You may have symptoms of withdrawal because your body is used to nicotine (the addictive substance in cigarettes). You may crave cigarettes, be irritable, feel very hungry, cough often, get headaches, or have difficulty concentrating. The withdrawal symptoms are only temporary. They are strongest when you first quit but will go away within 10-14 days. When withdrawal symptoms occur, stay in control. Think about your reasons for quitting. Remind yourself that these are signs that your body is healing and getting used to being without cigarettes. Remember that withdrawal symptoms are easier to treat than the major diseases that smoking can cause.  Even after the withdrawal is over, expect periodic urges to smoke. However, these cravings are generally short lived and will go away whether you smoke or not. Do not smoke! WHAT RESOURCES ARE AVAILABLE TO HELP ME QUIT SMOKING? Your health care provider can direct you to community resources or hospitals for support, which may include:  Group support.  Education.  Hypnosis.  Therapy.   This information is not intended to replace advice given to you by your health care provider. Make sure you discuss any questions you have with your health care provider.   Document Released: 01/04/2004 Document Revised: 04/28/2014 Document Reviewed: 09/23/2012 Elsevier Interactive Patient Education [employer].

## 2016-01-15 ENCOUNTER — Ambulatory Visit (INDEPENDENT_AMBULATORY_CARE_PROVIDER_SITE_OTHER): Payer: Self-pay | Admitting: Pulmonary Disease

## 2016-01-15 ENCOUNTER — Encounter: Payer: Self-pay | Admitting: Pulmonary Disease

## 2016-01-15 VITALS — BP 108/60 | HR 88 | Ht 66.0 in | Wt 160.0 lb

## 2016-01-15 DIAGNOSIS — R05 Cough: Secondary | ICD-10-CM

## 2016-01-15 DIAGNOSIS — Z720 Tobacco use: Secondary | ICD-10-CM

## 2016-01-15 DIAGNOSIS — J432 Centrilobular emphysema: Secondary | ICD-10-CM

## 2016-01-15 DIAGNOSIS — Z23 Encounter for immunization: Secondary | ICD-10-CM

## 2016-01-15 DIAGNOSIS — R058 Other specified cough: Secondary | ICD-10-CM

## 2016-01-15 MED ORDER — BUPROPION HCL ER (SR) 150 MG PO TB12: 150.0000 mg | ORAL_TABLET | Freq: Two times a day (BID) | ORAL | 3 refills | Status: DC

## 2016-01-15 NOTE — Progress Notes (Signed)
Current Outpatient Prescriptions on File Prior to Visit  Medication Sig  . albuterol (PROVENTIL HFA;VENTOLIN HFA) 108 (90 Base) MCG/ACT inhaler Inhale 2 puffs into the lungs every 6 (six) hours as needed for wheezing or shortness of breath.  Marland Kitchen albuterol (PROVENTIL) (2.5 MG/3ML) 0.083% nebulizer solution Take 3 mLs (2.5 mg total) by nebulization every 6 (six) hours as needed for wheezing or shortness of breath.  . benzonatate (TESSALON) 200 MG capsule Take 1 capsule (200 mg total) by mouth 3 (three) times daily as needed for cough.  . Cholecalciferol (VITAMIN D3) 5000 units CAPS Take 1 capsule by mouth daily.  Marland Kitchen dexlansoprazole (DEXILANT) 60 MG capsule Take 60 mg by mouth daily.  Marland Kitchen escitalopram (LEXAPRO) 20 MG tablet Take 20 mg by mouth daily.  . fluticasone (FLONASE) 50 MCG/ACT nasal spray Place into both nostrils daily as needed for allergies or rhinitis.  . Fluticasone Furoate-Vilanterol (BREO ELLIPTA) 100-25 MCG/INH AEPB Inhale 1 puff into the lungs daily.   No current facility-administered medications on file prior to visit.     Chief Complaint  Patient presents with  . Follow-up    Pt reports no change in cough since last OV. Pt states that the Tessalon Perles seem to help some for the cough. Pt completed Pepcid, states that it seemed to help a little. Taking Flonase and Breo as directed.    Pulmonary tests CT chest 07/08/11 >> mild centrilobular emphysema, 2 mm calcified RUL nodule no change from 2010, hepatic steatosis PFT 08/28/14 >> FEV1 2.52 (86%), FEV1% 79, TLC 5.44 (101%), DLCO 78%, no BD  Past medical history Allergies, OSA  Past surgical history, Family history, Social history, Allergies reviewed  Vital signs BP 108/60 (BP Location: Left Arm, Cuff Size: Normal)   Pulse 88   Ht 5\' 6"  (1.676 m)   Wt 160 lb (72.6 kg)   SpO2 95%   BMI 25.82 kg/m   History of Present Illness: Destiny Villanueva is a 55 y.o. female former smoker with chronic cough and cough syncope.  She  continues to smoke 1 ppd.  She used wellbutrin before and this helped.  She still has cough, chest tightness, and intermittent wheeze.  Breo and albuterol help.  She is using flonase.  She doesn't think her reflux is much of an issue at present.  Physical Exam:  General - No distress ENT - No sinus tenderness, no oral exudate, no LAN Cardiac - s1s2 regular, no murmur Chest - No wheeze/rales/dullness Back - No focal tenderness Abd - Soft, non-tender Ext - No edema Neuro - Normal strength Skin - No rashes Psych - normal mood, and behavior   Assessment/Plan:  Upper airway cough syndrome with allergic rhinitis. - continue flonase - advised her to try using nasal irrigation  Emphysema. - continue breo and prn albuterol - she will get flu shot later in October  Tobacco abuse. - will have her try bupropion again  GERD. - per PCP   Patient Instructions  Bupropion 150 mg pill >> one pill daily for 3 days, then one pill twice per day.  Stop smoking after you use bupropion for 1 week  Follow up in 6 weeks with Dr. Halford Chessman or Nurse Practitioner    Chesley Mires, MD [facility] Pager:  [phone] 01/15/2016, 11:50 AM

## 2016-01-15 NOTE — Patient Instructions (Signed)
Bupropion 150 mg pill >> one pill daily for 3 days, then one pill twice per day.  Stop smoking after you use bupropion for 1 week  Follow up in 6 weeks with Dr. Halford Chessman or Nurse Practitioner

## 2016-01-21 ENCOUNTER — Ambulatory Visit: Payer: Self-pay

## 2016-02-26 ENCOUNTER — Ambulatory Visit: Payer: Self-pay | Admitting: Adult Health

## 2016-03-17 ENCOUNTER — Encounter (HOSPITAL_COMMUNITY): Payer: Self-pay

## 2016-03-17 ENCOUNTER — Encounter (HOSPITAL_COMMUNITY): Payer: Self-pay | Admitting: Oncology

## 2016-03-17 ENCOUNTER — Encounter (HOSPITAL_COMMUNITY): Payer: Self-pay | Attending: Hematology & Oncology | Admitting: Oncology

## 2016-03-17 VITALS — BP 119/72 | HR 96 | Resp 18 | Ht 66.0 in | Wt 156.0 lb

## 2016-03-17 DIAGNOSIS — K760 Fatty (change of) liver, not elsewhere classified: Secondary | ICD-10-CM

## 2016-03-17 DIAGNOSIS — R161 Splenomegaly, not elsewhere classified: Secondary | ICD-10-CM

## 2016-03-17 DIAGNOSIS — D696 Thrombocytopenia, unspecified: Secondary | ICD-10-CM

## 2016-03-17 LAB — CBC WITH DIFFERENTIAL/PLATELET
BASOS PCT: 0 %
Basophils Absolute: 0 10*3/uL (ref 0.0–0.1)
EOS ABS: 0.1 10*3/uL (ref 0.0–0.7)
Eosinophils Relative: 1 %
HCT: 40.6 % (ref 36.0–46.0)
HEMOGLOBIN: 13.4 g/dL (ref 12.0–15.0)
LYMPHS ABS: 1.7 10*3/uL (ref 0.7–4.0)
Lymphocytes Relative: 33 %
MCH: 27.9 pg (ref 26.0–34.0)
MCHC: 33 g/dL (ref 30.0–36.0)
MCV: 84.4 fL (ref 78.0–100.0)
Monocytes Absolute: 0.3 10*3/uL (ref 0.1–1.0)
Monocytes Relative: 6 %
NEUTROS PCT: 59 %
Neutro Abs: 3 10*3/uL (ref 1.7–7.7)
Platelets: 119 10*3/uL — ABNORMAL LOW (ref 150–400)
RBC: 4.81 MIL/uL (ref 3.87–5.11)
RDW: 13.2 % (ref 11.5–15.5)
WBC: 5.1 10*3/uL (ref 4.0–10.5)

## 2016-03-17 NOTE — Patient Instructions (Signed)
[facility] at [facility] Discharge Instructions  RECOMMENDATIONS MADE BY THE CONSULTANT AND ANY TEST RESULTS WILL BE SENT TO YOUR REFERRING PHYSICIAN.  You were seen today by Tom Kefalas PA-C. Return in 4 months for labs and follow up.   Thank you for choosing [facility] at [facility] to provide your oncology and hematology care.  To afford each patient quality time with our provider, please arrive at least 15 minutes before your scheduled appointment time.   Beginning January 23rd 2017 lab work for the Cancer Center will be done in the  Main lab at [place] on 1st floor. If you have a lab appointment with the Cancer Center please come in thru the  Main Entrance and check in at the main information desk  You need to re-schedule your appointment should you arrive 10 or more minutes late.  We strive to give you quality time with our providers, and arriving late affects you and other patients whose appointments are after yours.  Also, if you no show three or more times for appointments you may be dismissed from the clinic at the providers discretion.     Again, thank you for choosing [facility].  Our hope is that these requests will decrease the amount of time that you wait before being seen by our physicians.       _____________________________________________________________  Should you have questions after your visit to [facility], please contact our office at [phone] between the hours of 8:30 a.m. and 4:30 p.m.  Voicemails left after 4:30 p.m. will not be returned until the following business day.  For prescription refill requests, have your pharmacy contact our office.         Resources For Cancer Patients and their Caregivers ? American Cancer Society: Can assist with transportation, wigs, general needs, runs Look Good Feel Better.        1-888-227-6333 ? Cancer Care: Provides financial assistance,  online support groups, medication/co-pay assistance.  1-800-813-HOPE (4673) ? [facility] Assists [place] cancer patients and their families through emotional , educational and financial support.  336-427-4357 ? [place] DSS Where to apply for food stamps, Medicaid and utility assistance. 336-342-1394 ? [facility]: Transportation to medical appointments. 336-347-2287 ? Social Security Administration: May apply for disability if have a Stage IV cancer. 336-342-7796 1-800-772-1213 ? [place] Aging, Disability and Transit Services: Assists with nutrition, care and transit needs. 336-349-2343  Cancer Center Support Programs: @[email or web address]@ > Cancer Support Group  2nd Tuesday of the month 1pm-2pm, Journey Room  > Creative Journey  3rd Tuesday of the month 1130am-1pm, Journey Room  > Look Good Feel Better  1st Wednesday of the month 10am-12 noon, Journey Room (Call American Cancer Society to register 1-800-395-5775)    
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
## 2016-03-17 NOTE — Assessment & Plan Note (Addendum)
Mild thrombocytopenia in the setting of fatty liver disease and splenomegaly.   Labs today: CBC diff.  I personally reviewed and went over laboratory results with the patient.  The results are noted within this dictation.  Labs in 4 months: CBC diff.  I would like to perform HIV and hepatitis testing but due to lack of insurance, and stability of mild thrombocytopenia, we will forego this testing at this time.  She mentions that she may be able to have these two tests performed at the health department which she is welcome to explore.  She declines influenza immunization.  This too she will see if she can get at the Health department.  Return in 4 months for follow-up.  She knows to call with any abnormal bleeding or bruising.

## 2016-03-30 ENCOUNTER — Emergency Department (HOSPITAL_COMMUNITY)
Admission: EM | Admit: 2016-03-30 | Discharge: 2016-03-30 | Disposition: A | Discharge status: Home / Self Care | Payer: Self-pay | Attending: Emergency Medicine | Admitting: Emergency Medicine

## 2016-03-30 ENCOUNTER — Encounter (HOSPITAL_COMMUNITY): Payer: Self-pay | Admitting: Emergency Medicine

## 2016-03-30 DIAGNOSIS — F17210 Nicotine dependence, cigarettes, uncomplicated: Secondary | ICD-10-CM | POA: Insufficient documentation

## 2016-03-30 DIAGNOSIS — Z79899 Other long term (current) drug therapy: Secondary | ICD-10-CM | POA: Insufficient documentation

## 2016-03-30 DIAGNOSIS — L309 Dermatitis, unspecified: Secondary | ICD-10-CM | POA: Insufficient documentation

## 2016-03-30 DIAGNOSIS — J449 Chronic obstructive pulmonary disease, unspecified: Secondary | ICD-10-CM | POA: Insufficient documentation

## 2016-03-30 MED ORDER — FAMOTIDINE 20 MG PO TABS
20.0000 mg | ORAL_TABLET | Freq: Once | ORAL | Status: AC
  Administered 2016-03-30: 20 mg via ORAL
  Filled 2016-03-30: qty 1

## 2016-03-30 MED ORDER — HYDROXYZINE PAMOATE 25 MG PO CAPS: 25.0000 mg | ORAL_CAPSULE | Freq: Four times a day (QID) | ORAL | 0 refills | Status: DC | PRN

## 2016-03-30 MED ORDER — DEXAMETHASONE 4 MG PO TABS: 4.0000 mg | ORAL_TABLET | Freq: Two times a day (BID) | ORAL | 0 refills | Status: DC

## 2016-03-30 MED ORDER — LORATADINE 10 MG PO TABS
10.0000 mg | ORAL_TABLET | Freq: Once | ORAL | Status: AC
  Administered 2016-03-30: 10 mg via ORAL
  Filled 2016-03-30: qty 1

## 2016-03-30 MED ORDER — DEXAMETHASONE SODIUM PHOSPHATE 4 MG/ML IJ SOLN
8.0000 mg | Freq: Once | INTRAMUSCULAR | Status: AC
  Administered 2016-03-30: 8 mg via INTRAMUSCULAR
  Filled 2016-03-30: qty 2
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
## 2016-03-30 NOTE — Discharge Instructions (Signed)
Please see Dr. [place] Crane or a member of his team concerning your rash for dermatology evaluation. Benadryl Gel may be helpful to the rash areas for itching. Please use Claritin during the day for nondrowsy antihistamine use. Use Vistaril during the evening, or when you are not driving or operating machinery. Vistaril may cause drowsiness, please use this medication with caution. Use Decadron 2 times daily with food.

## 2016-03-30 NOTE — ED Triage Notes (Signed)
PT c/o rash to bilateral arms, neck, legs x1 month. PT states she was seen at [facility] ED x1 month ago and was given a 10 day course of doxycycline at that time.

## 2016-04-03 ENCOUNTER — Ambulatory Visit: Payer: Self-pay | Admitting: Physician Assistant

## 2016-04-24 ENCOUNTER — Other Ambulatory Visit (HOSPITAL_COMMUNITY): Payer: Self-pay | Admitting: Oncology

## 2016-04-24 ENCOUNTER — Encounter (HOSPITAL_COMMUNITY): Payer: Self-pay | Admitting: Oncology

## 2016-04-24 DIAGNOSIS — D696 Thrombocytopenia, unspecified: Secondary | ICD-10-CM

## 2016-05-05 ENCOUNTER — Encounter (HOSPITAL_COMMUNITY): Payer: Self-pay | Attending: Hematology & Oncology

## 2016-05-05 DIAGNOSIS — D696 Thrombocytopenia, unspecified: Secondary | ICD-10-CM | POA: Insufficient documentation

## 2016-05-06 LAB — HIV ANTIBODY (ROUTINE TESTING W REFLEX): HIV Screen 4th Generation wRfx: NONREACTIVE

## 2016-05-06 LAB — HEPATITIS PANEL, ACUTE
HEP A IGM: NEGATIVE
HEP B S AG: NEGATIVE
Hep B C IgM: NEGATIVE
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
## 2016-07-16 ENCOUNTER — Ambulatory Visit (HOSPITAL_COMMUNITY): Payer: Self-pay | Admitting: Oncology

## 2016-07-16 ENCOUNTER — Encounter (HOSPITAL_BASED_OUTPATIENT_CLINIC_OR_DEPARTMENT_OTHER): Payer: Self-pay | Admitting: Adult Health

## 2016-07-16 ENCOUNTER — Other Ambulatory Visit (HOSPITAL_COMMUNITY): Payer: Self-pay

## 2016-07-16 ENCOUNTER — Encounter (HOSPITAL_COMMUNITY): Payer: Self-pay | Admitting: Adult Health

## 2016-07-16 ENCOUNTER — Encounter (HOSPITAL_COMMUNITY): Payer: Self-pay | Attending: Oncology

## 2016-07-16 VITALS — BP 115/62 | HR 80 | Temp 98.2°F | Resp 18 | Ht 66.0 in | Wt 158.0 lb

## 2016-07-16 DIAGNOSIS — R5383 Other fatigue: Secondary | ICD-10-CM

## 2016-07-16 DIAGNOSIS — D696 Thrombocytopenia, unspecified: Secondary | ICD-10-CM

## 2016-07-16 LAB — CBC WITH DIFFERENTIAL/PLATELET
BASOS ABS: 0 10*3/uL (ref 0.0–0.1)
Basophils Relative: 1 %
EOS PCT: 2 %
Eosinophils Absolute: 0.1 10*3/uL (ref 0.0–0.7)
HEMATOCRIT: 40.4 % (ref 36.0–46.0)
HEMOGLOBIN: 13.4 g/dL (ref 12.0–15.0)
LYMPHS ABS: 1.5 10*3/uL (ref 0.7–4.0)
LYMPHS PCT: 33 %
MCH: 27.7 pg (ref 26.0–34.0)
MCHC: 33.2 g/dL (ref 30.0–36.0)
MCV: 83.5 fL (ref 78.0–100.0)
Monocytes Absolute: 0.3 10*3/uL (ref 0.1–1.0)
Monocytes Relative: 7 %
NEUTROS PCT: 57 %
Neutro Abs: 2.6 10*3/uL (ref 1.7–7.7)
PLATELETS: 140 10*3/uL — AB (ref 150–400)
RBC: 4.84 MIL/uL (ref 3.87–5.11)
RDW: 13.8 % (ref 11.5–15.5)
WBC: 4.4 10*3/uL (ref 4.0–10.5)

## 2016-07-16 LAB — TSH: TSH: 2.726 u[IU]/mL (ref 0.350–4.500)

## 2016-07-16 LAB — T4, FREE: Free T4: 0.73 ng/dL (ref 0.61–1.12)

## 2016-07-16 NOTE — Patient Instructions (Signed)
[place] at [facility] Discharge Instructions  RECOMMENDATIONS MADE BY THE CONSULTANT AND ANY TEST RESULTS WILL BE SENT TO YOUR REFERRING PHYSICIAN.  You were seen today by Mike Craze, NP Lab work today Follow up in 4 months with lab work We have provided you with a list of primary care physicians, please call and try to get established with a provider of your choice. See Amy up front for appointments    Thank you for choosing [place] at [facility] to provide your oncology and hematology care.  To afford each patient quality time with our provider, please arrive at least 15 minutes before your scheduled appointment time.    If you have a lab appointment with the [place] please come in thru the  Main Entrance and check in at the main information desk  You need to re-schedule your appointment should you arrive 10 or more minutes late.  We strive to give you quality time with our providers, and arriving late affects you and other patients whose appointments are after yours.  Also, if you no show three or more times for appointments you may be dismissed from the clinic at the providers discretion.     Again, thank you for choosing [facility]  Our hope is that these requests will decrease the amount of time that you wait before being seen by our physicians.       _____________________________________________________________  Should you have questions after your visit to [facility], please contact our office at [phone] between the hours of 8:30 a.m. and 4:30 p.m.  Voicemails left after 4:30 p.m. will not be returned until the following business day.  For prescription refill requests, have your pharmacy contact our office.       Resources For Cancer Patients and their Caregivers ? American Cancer Society: Can assist with transportation, wigs, general needs, runs Look Good Feel Better.         2502454350 ? Cancer Care: Provides financial assistance, online support groups, medication/co-pay assistance.  1-800-813-HOPE 210 497 8354) ? [place] Assists [place] cancer patients and their families through emotional , educational and financial support.  731-340-0276 ? [place] DSS Where to apply for food stamps, Medicaid and utility assistance. 972 127 8845 ? [facility]: Transportation to medical appointments. (814)648-9376 ? Social Security Administration: May apply for disability if have a Stage IV cancer. (941)744-0617 716-403-2285 ? [employer], Disability and Transit Services: Assists with nutrition, care and transit needs. [place] Support Programs: @10RELATIVEDAYS @ > Cancer Support Group  2nd Tuesday of the month 1pm-2pm, Journey Room  > Creative Journey  3rd Tuesday of the month 1130am-1pm, Journey Room  > Look Good Feel Better  1st Wednesday of the month 10am-12 noon, Journey Room (Call [place] to register [phone])

## 2016-08-06 ENCOUNTER — Encounter: Payer: Self-pay | Admitting: Pulmonary Disease

## 2016-08-06 NOTE — Telephone Encounter (Signed)
Forwarding to lung nodule pool  

## 2016-08-07 ENCOUNTER — Telehealth: Payer: Self-pay | Admitting: Acute Care

## 2016-08-07 DIAGNOSIS — F17210 Nicotine dependence, cigarettes, uncomplicated: Principal | ICD-10-CM

## 2016-08-11 NOTE — Telephone Encounter (Signed)
Will forward to the lung nodule pool 

## 2016-08-13 NOTE — Telephone Encounter (Signed)
Spoke with pt and scheduled for [facility] 08/29/16 2:30 CT ordered Nothing further needed

## 2016-08-13 NOTE — Telephone Encounter (Signed)
Patient returned phone.Destiny Villanueva

## 2016-08-13 NOTE — Telephone Encounter (Signed)
LMTC x 1  

## 2016-08-29 ENCOUNTER — Ambulatory Visit
Admission: RE | Admit: 2016-08-29 | Discharge: 2016-08-29 | Disposition: A | Discharge status: Home / Self Care | Payer: No Typology Code available for payment source | Source: Ambulatory Visit | Attending: Acute Care | Admitting: Acute Care

## 2016-08-29 ENCOUNTER — Ambulatory Visit (INDEPENDENT_AMBULATORY_CARE_PROVIDER_SITE_OTHER): Payer: Self-pay | Admitting: Acute Care

## 2016-08-29 ENCOUNTER — Encounter: Payer: Self-pay | Admitting: Acute Care

## 2016-08-29 DIAGNOSIS — F17210 Nicotine dependence, cigarettes, uncomplicated: Secondary | ICD-10-CM

## 2016-08-29 NOTE — Progress Notes (Signed)
Shared Decision Making Visit Lung Cancer Screening Program [phone])   Eligibility:  Age 56 y.o.  Pack Years Smoking History Calculation 39 pack years smoking history (# packs/per year x # years smoked)  Recent History of coughing up blood  no  Unexplained weight loss? no ( >Than 15 pounds within the last 6 months )  Prior History Lung / other cancer no (Diagnosis within the last 5 years already requiring surveillance chest CT Scans).  Smoking Status Current Smoker  Former Smokers: Years since quit: NA  Quit Date: Not applicable  Visit Components:  Discussion included one or more decision making aids. yes  Discussion included risk/benefits of screening. yes  Discussion included potential follow up diagnostic testing for abnormal scans. yes  Discussion included meaning and risk of over diagnosis. yes  Discussion included meaning and risk of False Positives. yes  Discussion included meaning of total radiation exposure. yes  Counseling Included:  Importance of adherence to annual lung cancer LDCT screening. yes  Impact of comorbidities on ability to participate in the program. yes  Ability and willingness to under diagnostic treatment. yes  Smoking Cessation Counseling:  Current Smokers:   Discussed importance of smoking cessation. yes  Information about tobacco cessation classes and interventions provided to patient. yes  Patient provided with "ticket" for LDCT Scan. yes  Symptomatic Patient. no  Counseling  Diagnosis Code: Tobacco Use Z72.0  Asymptomatic Patient yes  Counseling (Intermediate counseling: > three minutes counseling) U2353  Former Smokers:   Discussed the importance of maintaining cigarette abstinence. yes  Diagnosis Code: Personal History of Nicotine Dependence. I14.431  Information about tobacco cessation classes and interventions provided to patient. Yes  Patient provided with "ticket" for LDCT Scan. yes  Written Order for Lung  Cancer Screening with LDCT placed in Epic. Yes (CT Chest Lung Cancer Screening Low Dose W/O CM) [id number] Z12.2-Screening of respiratory organs Z87.891-Personal history of nicotine dependence  I have spent 25 minutes of face to face time with Destiny Villanueva discussing the risks and benefits of lung cancer screening. We viewed a power point together that explained in detail the above noted topics. We paused at intervals to allow for questions to be asked and answered to ensure understanding.We discussed that the single most powerful action that she can take to decrease her risk of developing lung cancer is to quit smoking. We discussed whether or not she is ready to commit to setting a quit date. We discussed options for tools to aid in quitting smoking including nicotine replacement therapy, non-nicotine medications, support groups, Quit Smart classes, and behavior modification. We discussed that often times setting smaller, more achievable goals, such as eliminating 1 cigarette a day for a week and then 2 cigarettes a day for a week can be helpful in slowly decreasing the number of cigarettes smoked. This allows for a sense of accomplishment as well as providing a clinical benefit. I gave [name] the " Be Stronger Than Your Excuses" card with contact information for community resources, classes, free nicotine replacement therapy, and access to mobile apps, text messaging, and on-line smoking cessation help. I have also given her my card and contact information in the event she needs to contact me. We discussed the time and location of the scan, and that either Doroteo Glassman RN or I will call with the results within 24-48 hours of receiving them. I have offered [name]  a copy of the power point we viewed  as a resource in the event they  need reinforcement of the concepts we discussed today in the office. The patient verbalized understanding of all of  the above and had no further questions upon leaving  the office. They have my contact information in the event they have any further questions.  I spent 3-4 minutes counseling on smoking cessation and the health risks of continued tobacco abuse.  I explained to the patient that there has been a high incidence of coronary artery disease noted on these exams. I explained that this is a non-gated exam therefore degree or severity cannot be determined. This patient is not currently on statin therapy. I have asked the patient to follow-up with their PCP regarding any incidental finding of coronary artery disease and management with diet or medication as their PCP  feels is clinically indicated. The patient verbalized understanding of the above and had no further questions upon completion of the visit.     Magdalen Spatz, NP 08/29/2016

## 2016-09-04 ENCOUNTER — Telehealth: Payer: Self-pay | Admitting: Acute Care

## 2016-09-04 DIAGNOSIS — F17210 Nicotine dependence, cigarettes, uncomplicated: Principal | ICD-10-CM

## 2016-09-05 NOTE — Telephone Encounter (Signed)
Will forward to the lung screening pool 

## 2016-09-05 NOTE — Telephone Encounter (Signed)
Patient returning [place] call for results, [phone]

## 2016-09-08 NOTE — Telephone Encounter (Signed)
Pt informed of CT results per Sarah Groce, NP.  PT verbalized understanding.  Copy sent to PCP.  Order placed for 1 yr f/u CT.  

## 2016-09-29 ENCOUNTER — Encounter: Payer: Self-pay | Admitting: Physician Assistant

## 2016-09-29 ENCOUNTER — Ambulatory Visit: Payer: Self-pay | Admitting: Physician Assistant

## 2016-09-29 ENCOUNTER — Other Ambulatory Visit (HOSPITAL_COMMUNITY)
Admission: RE | Admit: 2016-09-29 | Discharge: 2016-09-29 | Disposition: A | Discharge status: Home / Self Care | Payer: Self-pay | Source: Ambulatory Visit | Attending: Physician Assistant | Admitting: Physician Assistant

## 2016-09-29 VITALS — BP 92/64 | HR 89 | Temp 97.3°F | Ht 66.0 in | Wt 158.0 lb

## 2016-09-29 DIAGNOSIS — F17219 Nicotine dependence, cigarettes, with unspecified nicotine-induced disorders: Secondary | ICD-10-CM

## 2016-09-29 DIAGNOSIS — D696 Thrombocytopenia, unspecified: Secondary | ICD-10-CM

## 2016-09-29 DIAGNOSIS — F39 Unspecified mood [affective] disorder: Secondary | ICD-10-CM

## 2016-09-29 DIAGNOSIS — J449 Chronic obstructive pulmonary disease, unspecified: Secondary | ICD-10-CM

## 2016-09-29 DIAGNOSIS — I700 Atherosclerosis of aorta: Secondary | ICD-10-CM

## 2016-09-29 DIAGNOSIS — Z1239 Encounter for other screening for malignant neoplasm of breast: Secondary | ICD-10-CM

## 2016-09-29 LAB — COMPREHENSIVE METABOLIC PANEL
ALBUMIN: 4.1 g/dL (ref 3.5–5.0)
ALK PHOS: 55 U/L (ref 38–126)
ALT: 20 U/L (ref 14–54)
AST: 34 U/L (ref 15–41)
Anion gap: 7 (ref 5–15)
BILIRUBIN TOTAL: 0.6 mg/dL (ref 0.3–1.2)
BUN: 12 mg/dL (ref 6–20)
CO2: 28 mmol/L (ref 22–32)
Calcium: 9.1 mg/dL (ref 8.9–10.3)
Chloride: 101 mmol/L (ref 101–111)
Creatinine, Ser: 0.7 mg/dL (ref 0.44–1.00)
GFR calc Af Amer: >60 mL/min (ref >60)
GFR calc non Af Amer: >60 mL/min (ref >60)
GLUCOSE: 98 mg/dL (ref 65–99)
POTASSIUM: 4.2 mmol/L (ref 3.5–5.1)
SODIUM: 136 mmol/L (ref 135–145)
TOTAL PROTEIN: 6.8 g/dL (ref 6.5–8.1)

## 2016-09-29 LAB — LIPID PANEL
CHOLESTEROL: 139 mg/dL (ref 0–200)
HDL: 32 mg/dL — ABNORMAL LOW (ref >40)
LDL Cholesterol: 77 mg/dL (ref 0–99)
Total CHOL/HDL Ratio: 4.3 RATIO
Triglycerides: 150 mg/dL — ABNORMAL HIGH (ref <150)
VLDL: 30 mg/dL (ref 0–40)

## 2016-09-29 MED ORDER — LOVASTATIN 20 MG PO TABS: 20.0000 mg | ORAL_TABLET | Freq: Every day | ORAL | 3 refills | Status: DC

## 2016-09-29 NOTE — Progress Notes (Signed)
BP 92/64 (BP Location: Left Arm, Patient Position: Sitting, Cuff Size: Normal)   Pulse 89   Temp 97.3 F (36.3 C) (Other (Comment))   Ht 5\' 6"  (1.676 m)   Wt 158 lb (71.7 kg)   SpO2 97%   BMI 25.50 kg/m    Subjective:    Patient ID: Destiny Villanueva, female    DOB: 11/01/1960, 56 y.o.   MRN: [id number]  HPI: Destiny Villanueva is a 56 y.o. female presenting on 09/29/2016 for Follow-up   HPI  Pt not seen since September.  She cancelled her December follow up appointment.   She is going to [facility].  She is still seeing pulmonologist and hematologist.   She is still smoking  Relevant past medical, surgical, family and social history reviewed and updated as indicated. Interim medical history since our last visit reviewed. Allergies and medications reviewed and updated.   Current Outpatient Prescriptions:  .  albuterol (PROVENTIL HFA;VENTOLIN HFA) 108 (90 Base) MCG/ACT inhaler, Inhale 2 puffs into the lungs every 6 (six) hours as needed for wheezing or shortness of breath., Disp: 3 Inhaler, Rfl: 0 .  albuterol (PROVENTIL) (2.5 MG/3ML) 0.083% nebulizer solution, Take 3 mLs (2.5 mg total) by nebulization every 6 (six) hours as needed for wheezing or shortness of breath., Disp: 75 mL, Rfl: 12 .  citalopram (CELEXA) 20 MG tablet, Take 20 mg by mouth daily., Disp: , Rfl:  .  fluticasone (FLONASE) 50 MCG/ACT nasal spray, Place into both nostrils daily as needed for allergies or rhinitis., Disp: , Rfl:  .  Fluticasone Furoate-Vilanterol (BREO ELLIPTA) 100-25 MCG/INH AEPB, Inhale 1 puff into the lungs daily., Disp: 1 each, Rfl: 5   Review of Systems  Constitutional: Positive for diaphoresis and fatigue. Negative for appetite change, chills, fever and unexpected weight change.  HENT: Positive for congestion and sneezing. Negative for dental problem, drooling, ear pain, facial swelling, hearing loss, mouth sores, sore throat, trouble swallowing and voice change.   Eyes: Negative for pain,  discharge, redness, itching and visual disturbance.  Respiratory: Positive for cough and shortness of breath. Negative for choking and wheezing.   Cardiovascular: Negative for chest pain, palpitations and leg swelling.  Gastrointestinal: Positive for constipation. Negative for abdominal pain, blood in stool, diarrhea and vomiting.  Endocrine: Positive for heat intolerance. Negative for cold intolerance and polydipsia.  Genitourinary: Negative for decreased urine volume, dysuria and hematuria.  Musculoskeletal: Positive for arthralgias and back pain. Negative for gait problem.  Skin: Negative for rash.  Allergic/Immunologic: Positive for environmental allergies.  Neurological: Positive for light-headedness. Negative for seizures, syncope and headaches.  Hematological: Negative for adenopathy.  Psychiatric/Behavioral: Positive for agitation and dysphoric mood. Negative for suicidal ideas. The patient is nervous/anxious.     Per HPI unless specifically indicated above     Objective:    BP 92/64 (BP Location: Left Arm, Patient Position: Sitting, Cuff Size: Normal)   Pulse 89   Temp 97.3 F (36.3 C) (Other (Comment))   Ht 5\' 6"  (1.676 m)   Wt 158 lb (71.7 kg)   SpO2 97%   BMI 25.50 kg/m   Wt Readings from Last 3 Encounters:  09/29/16 158 lb (71.7 kg)  07/16/16 158 lb (71.7 kg)  03/30/16 155 lb (70.3 kg)    Physical Exam  Constitutional: She is oriented to person, place, and time. She appears well-developed and well-nourished.  HENT:  Head: Normocephalic and atraumatic.  Neck: Neck supple.  Cardiovascular: Normal rate and regular rhythm.  Pulmonary/Chest: Effort normal and breath sounds normal.  Abdominal: Soft. Bowel sounds are normal. She exhibits no mass. There is no hepatosplenomegaly. There is no tenderness.  Musculoskeletal: She exhibits no edema.  Lymphadenopathy:    She has no cervical adenopathy.  Neurological: She is alert and oriented to person, place, and time.   Skin: Skin is warm and dry.  Psychiatric: She has a normal mood and affect. Her behavior is normal.  Vitals reviewed.       Assessment & Plan:   Encounter Diagnoses  Name Primary?  . Atherosclerosis of aorta ([place]) Yes  . Screening for breast cancer   . Cigarette nicotine dependence with nicotine-induced disorder   . Chronic obstructive pulmonary disease, unspecified COPD type ([place])   . Thrombocytopenia ([place])   . Mood disorder ([place])      -order screening Mammogram -Reviewed CT results with pt -Check lipids and start lovastatin for atherosclerosis -counseled smoking cessation -pt to follow up 3 months. RTO sooner prn
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
## 2016-11-17 NOTE — Assessment & Plan Note (Addendum)
Mild thrombocytopenia in the setting of fatty liver disease and splenomegaly.   Labs today: CBC diff.  I personally reviewed and went over laboratory results with the patient.  The results are noted within this dictation.  Labs are very stable.  Labs in 6 months: CBC diff, pathology smear review.  Constipation sheet and diarrhea sheet will be provided the patient.  We discussed healthy eating habits in addition to light exercise to assist in recommended weight loss.  Return in 6 months for follow-up. She will call us with any abnormal bleeding/bruising.  In summer time of 2019, and blood counts are stable, decreasing frequency of lab and follow-up appointments to annual would not be unreasonable and/or release patient from clinic with follow-up by primary care physician.

## 2016-11-18 ENCOUNTER — Encounter (HOSPITAL_COMMUNITY): Payer: Self-pay

## 2016-11-18 ENCOUNTER — Encounter (HOSPITAL_COMMUNITY): Payer: Self-pay | Attending: Oncology | Admitting: Oncology

## 2016-11-18 ENCOUNTER — Encounter (HOSPITAL_COMMUNITY): Payer: Self-pay | Admitting: Oncology

## 2016-11-18 DIAGNOSIS — Z9889 Other specified postprocedural states: Secondary | ICD-10-CM | POA: Insufficient documentation

## 2016-11-18 DIAGNOSIS — F17210 Nicotine dependence, cigarettes, uncomplicated: Secondary | ICD-10-CM | POA: Insufficient documentation

## 2016-11-18 DIAGNOSIS — Z801 Family history of malignant neoplasm of trachea, bronchus and lung: Secondary | ICD-10-CM | POA: Insufficient documentation

## 2016-11-18 DIAGNOSIS — F419 Anxiety disorder, unspecified: Secondary | ICD-10-CM | POA: Insufficient documentation

## 2016-11-18 DIAGNOSIS — K219 Gastro-esophageal reflux disease without esophagitis: Secondary | ICD-10-CM | POA: Insufficient documentation

## 2016-11-18 DIAGNOSIS — Z9049 Acquired absence of other specified parts of digestive tract: Secondary | ICD-10-CM | POA: Insufficient documentation

## 2016-11-18 DIAGNOSIS — Z803 Family history of malignant neoplasm of breast: Secondary | ICD-10-CM | POA: Insufficient documentation

## 2016-11-18 DIAGNOSIS — D696 Thrombocytopenia, unspecified: Secondary | ICD-10-CM

## 2016-11-18 DIAGNOSIS — Z90710 Acquired absence of both cervix and uterus: Secondary | ICD-10-CM | POA: Insufficient documentation

## 2016-11-18 DIAGNOSIS — K760 Fatty (change of) liver, not elsewhere classified: Secondary | ICD-10-CM | POA: Insufficient documentation

## 2016-11-18 DIAGNOSIS — F329 Major depressive disorder, single episode, unspecified: Secondary | ICD-10-CM | POA: Insufficient documentation

## 2016-11-18 DIAGNOSIS — R161 Splenomegaly, not elsewhere classified: Secondary | ICD-10-CM | POA: Insufficient documentation

## 2016-11-18 LAB — CBC WITH DIFFERENTIAL/PLATELET
BASOS ABS: 0 10*3/uL (ref 0.0–0.1)
BASOS PCT: 0 %
EOS ABS: 0.1 10*3/uL (ref 0.0–0.7)
EOS PCT: 2 %
HCT: 38.7 % (ref 36.0–46.0)
HEMOGLOBIN: 12.9 g/dL (ref 12.0–15.0)
Lymphocytes Relative: 32 %
Lymphs Abs: 1.2 10*3/uL (ref 0.7–4.0)
MCH: 28 pg (ref 26.0–34.0)
MCHC: 33.3 g/dL (ref 30.0–36.0)
MCV: 83.9 fL (ref 78.0–100.0)
Monocytes Absolute: 0.4 10*3/uL (ref 0.1–1.0)
Monocytes Relative: 9 %
NEUTROS PCT: 57 %
Neutro Abs: 2.2 10*3/uL (ref 1.7–7.7)
PLATELETS: 111 10*3/uL — AB (ref 150–400)
RBC: 4.61 MIL/uL (ref 3.87–5.11)
RDW: 13.9 % (ref 11.5–15.5)
WBC: 3.8 10*3/uL — AB (ref 4.0–10.5)

## 2016-11-18 NOTE — Patient Instructions (Addendum)
[place] at [facility] Discharge Instructions  RECOMMENDATIONS MADE BY THE CONSULTANT AND ANY TEST RESULTS WILL BE SENT TO YOUR REFERRING PHYSICIAN.  You were seen today by Kirby Crigler PA-C. Constipation and Diarrhea sheets given today. Return in 6 months for labs and follow up.    Thank you for choosing [place] [facility] to provide your oncology and hematology care.  To afford each patient quality time with our provider, please arrive at least 15 minutes before your scheduled appointment time.    If you have a lab appointment with the [place] please come in thru the  Main Entrance and check in at the main information desk  You need to re-schedule your appointment should you arrive 10 or more minutes late.  We strive to give you quality time with our providers, and arriving late affects you and other patients whose appointments are after yours.  Also, if you no show three or more times for appointments you may be dismissed from the clinic at the providers discretion.     Again, thank you for choosing [facility].  Our hope is that these requests will decrease the amount of time that you wait before being seen by our physicians.       _____________________________________________________________  Should you have questions after your visit to [facility], please contact our office at [phone] between the hours of 8:30 a.m. and 4:30 p.m.  Voicemails left after 4:30 p.m. will not be returned until the following business day.  For prescription refill requests, have your pharmacy contact our office.       Resources For Cancer Patients and their Caregivers ? American Cancer Society: Can assist with transportation, wigs, general needs, runs Look Good Feel Better.        ([phone] ? Cancer Care: Provides financial assistance, online support groups, medication/co-pay assistance.  1-800-813-HOPE  407-206-1052) ? [place] Assists [place] cancer patients and their families through emotional , educational and financial support.  ([phone] ? [place] DSS Where to apply for food stamps, Medicaid and utility assistance. 580-408-1755 ? RCATS: Transportation to medical appointments. 479-779-1308 ? Social Security Administration: May apply for disability if have a Stage IV cancer. [phone] (909)184-5925 ? [employer], Disability and Transit Services: Assists with nutrition, care and transit needs. [place] Support Programs: @10RELATIVEDAYS @ > Cancer Support Group  2nd Tuesday of the month 1pm-2pm, Journey Room  > Creative Journey  3rd Tuesday of the month 1130am-1pm, Journey Room  > Look Good Feel Better  1st Wednesday of the month 10am-12 noon, Journey Room (Call [place] to register [phone])

## 2016-12-24 ENCOUNTER — Other Ambulatory Visit: Payer: Self-pay | Admitting: Physician Assistant

## 2016-12-24 DIAGNOSIS — Z1231 Encounter for screening mammogram for malignant neoplasm of breast: Secondary | ICD-10-CM

## 2016-12-25 ENCOUNTER — Other Ambulatory Visit (HOSPITAL_COMMUNITY)
Admission: RE | Admit: 2016-12-25 | Discharge: 2016-12-25 | Disposition: A | Discharge status: Home / Self Care | Payer: Self-pay | Source: Ambulatory Visit | Attending: Physician Assistant | Admitting: Physician Assistant

## 2016-12-25 DIAGNOSIS — I700 Atherosclerosis of aorta: Secondary | ICD-10-CM | POA: Insufficient documentation

## 2016-12-25 DIAGNOSIS — D696 Thrombocytopenia, unspecified: Secondary | ICD-10-CM | POA: Insufficient documentation

## 2016-12-25 LAB — CBC WITH DIFFERENTIAL/PLATELET
Basophils Absolute: 0 10*3/uL (ref 0.0–0.1)
Basophils Relative: 1 %
EOS PCT: 2 %
Eosinophils Absolute: 0.1 10*3/uL (ref 0.0–0.7)
HCT: 39.4 % (ref 36.0–46.0)
Hemoglobin: 13.1 g/dL (ref 12.0–15.0)
LYMPHS ABS: 1.3 10*3/uL (ref 0.7–4.0)
LYMPHS PCT: 31 %
MCH: 28.2 pg (ref 26.0–34.0)
MCHC: 33.2 g/dL (ref 30.0–36.0)
MCV: 84.9 fL (ref 78.0–100.0)
MONO ABS: 0.4 10*3/uL (ref 0.1–1.0)
Monocytes Relative: 9 %
Neutro Abs: 2.3 10*3/uL (ref 1.7–7.7)
Neutrophils Relative %: 58 %
PLATELETS: 103 10*3/uL — AB (ref 150–400)
RBC: 4.64 MIL/uL (ref 3.87–5.11)
RDW: 13.5 % (ref 11.5–15.5)
WBC: 4.1 10*3/uL (ref 4.0–10.5)

## 2016-12-25 LAB — HEPATIC FUNCTION PANEL
ALT: 27 U/L (ref 14–54)
AST: 46 U/L — ABNORMAL HIGH (ref 15–41)
Albumin: 4 g/dL (ref 3.5–5.0)
Alkaline Phosphatase: 49 U/L (ref 38–126)
BILIRUBIN DIRECT: 0.1 mg/dL (ref 0.1–0.5)
BILIRUBIN INDIRECT: 0.4 mg/dL (ref 0.3–0.9)
Total Bilirubin: 0.5 mg/dL (ref 0.3–1.2)
Total Protein: 6.7 g/dL (ref 6.5–8.1)

## 2016-12-25 LAB — LIPID PANEL
CHOLESTEROL: 117 mg/dL (ref 0–200)
HDL: 31 mg/dL — AB (ref >40)
LDL Cholesterol: 64 mg/dL (ref 0–99)
TRIGLYCERIDES: 108 mg/dL (ref <150)
Total CHOL/HDL Ratio: 3.8 RATIO
VLDL: 22 mg/dL (ref 0–40)

## 2016-12-26 LAB — PATHOLOGIST SMEAR REVIEW

## 2016-12-31 ENCOUNTER — Ambulatory Visit: Payer: Self-pay | Admitting: Physician Assistant

## 2017-01-06 ENCOUNTER — Encounter: Payer: Self-pay | Admitting: Physician Assistant

## 2017-01-06 ENCOUNTER — Ambulatory Visit: Payer: Self-pay | Admitting: Physician Assistant

## 2017-01-06 VITALS — BP 116/68 | HR 93 | Temp 97.5°F | Ht 66.0 in | Wt 167.0 lb

## 2017-01-06 DIAGNOSIS — I700 Atherosclerosis of aorta: Secondary | ICD-10-CM

## 2017-01-06 DIAGNOSIS — F32A Depression, unspecified: Secondary | ICD-10-CM

## 2017-01-06 DIAGNOSIS — F419 Anxiety disorder, unspecified: Secondary | ICD-10-CM

## 2017-01-06 DIAGNOSIS — D696 Thrombocytopenia, unspecified: Secondary | ICD-10-CM

## 2017-01-06 DIAGNOSIS — J449 Chronic obstructive pulmonary disease, unspecified: Secondary | ICD-10-CM

## 2017-01-06 DIAGNOSIS — F329 Major depressive disorder, single episode, unspecified: Secondary | ICD-10-CM

## 2017-01-06 DIAGNOSIS — K219 Gastro-esophageal reflux disease without esophagitis: Secondary | ICD-10-CM

## 2017-01-06 DIAGNOSIS — E785 Hyperlipidemia, unspecified: Secondary | ICD-10-CM

## 2017-01-06 DIAGNOSIS — F17219 Nicotine dependence, cigarettes, with unspecified nicotine-induced disorders: Secondary | ICD-10-CM

## 2017-01-06 NOTE — Progress Notes (Signed)
BP 116/68 (BP Location: Left Arm, Patient Position: Sitting, Cuff Size: Normal)   Pulse 93   Temp (!) 97.5 F (36.4 C) (Other (Comment))   Ht 5\' 6"  (1.676 m)   Wt 167 lb (75.8 kg)   SpO2 98%   BMI 26.95 kg/m    Subjective:    Patient ID: Destiny Villanueva, female    DOB: August 06, 1960, 56 y.o.   MRN: [id number]  HPI: Destiny Villanueva is a 56 y.o. female presenting on 01/06/2017 for No chief complaint on file.   HPI   Pt has medicare now so will no longer be eligible to get care at [facility]   She states understanding.   Pt states she is not out of any of her medications.   Relevant past medical, surgical, family and social history reviewed and updated as indicated. Interim medical history since our last visit reviewed. Allergies and medications reviewed and updated.   Current Outpatient Prescriptions:  .  albuterol (PROVENTIL HFA;VENTOLIN HFA) 108 (90 Base) MCG/ACT inhaler, Inhale 2 puffs into the lungs every 6 (six) hours as needed for wheezing or shortness of breath., Disp: 3 Inhaler, Rfl: 0 .  albuterol (PROVENTIL) (2.5 MG/3ML) 0.083% nebulizer solution, Take 3 mLs (2.5 mg total) by nebulization every 6 (six) hours as needed for wheezing or shortness of breath., Disp: 75 mL, Rfl: 12 .  citalopram (CELEXA) 20 MG tablet, Take 20 mg by mouth daily., Disp: , Rfl:  .  fluticasone (FLONASE) 50 MCG/ACT nasal spray, Place into both nostrils daily as needed for allergies or rhinitis., Disp: , Rfl:  .  lovastatin (MEVACOR) 20 MG tablet, Take 1 tablet (20 mg total) by mouth at bedtime., Disp: 30 tablet, Rfl: 3 .  ranitidine (ZANTAC) 75 MG tablet, Take 75 mg by mouth as needed for heartburn., Disp: , Rfl:  .  Fluticasone Furoate-Vilanterol (BREO ELLIPTA) 100-25 MCG/INH AEPB, Inhale 1 puff into the lungs daily. (Patient not taking: Reported on 01/06/2017), Disp: 1 each, Rfl: 5  Review of Systems  Per HPI unless specifically indicated above     Objective:    BP 116/68 (BP Location:  Left Arm, Patient Position: Sitting, Cuff Size: Normal)   Pulse 93   Temp (!) 97.5 F (36.4 C) (Other (Comment))   Ht 5\' 6"  (1.676 m)   Wt 167 lb (75.8 kg)   SpO2 98%   BMI 26.95 kg/m   Wt Readings from Last 3 Encounters:  01/06/17 167 lb (75.8 kg)  11/18/16 163 lb 8 oz (74.2 kg)  09/29/16 158 lb (71.7 kg)    Physical Exam  Constitutional: She is oriented to person, place, and time. She appears well-developed and well-nourished.  HENT:  Head: Normocephalic and atraumatic.  Pulmonary/Chest: Effort normal.  Neurological: She is alert and oriented to person, place, and time.  Skin: Skin is warm and dry.  Psychiatric: She has a normal mood and affect. Her behavior is normal.  Vitals reviewed.   Results for orders placed or performed during the hospital encounter of 12/25/16  Pathologist smear review  Result Value Ref Range   Path Review Reviewed By Destiny Villanueva, M.D.   CBC with Differential  Result Value Ref Range   WBC 4.1 4.0 - 10.5 K/uL   RBC 4.64 3.87 - 5.11 MIL/uL   Hemoglobin 13.1 12.0 - 15.0 g/dL   HCT 39.4 36.0 - 46.0 %   MCV 84.9 78.0 - 100.0 fL   MCH 28.2 26.0 - 34.0 pg  MCHC 33.2 30.0 - 36.0 g/dL   RDW 13.5 11.5 - 15.5 %   Platelets 103 (L) 150 - 400 K/uL   Neutrophils Relative % 58 %   Neutro Abs 2.3 1.7 - 7.7 K/uL   Lymphocytes Relative 31 %   Lymphs Abs 1.3 0.7 - 4.0 K/uL   Monocytes Relative 9 %   Monocytes Absolute 0.4 0.1 - 1.0 K/uL   Eosinophils Relative 2 %   Eosinophils Absolute 0.1 0.0 - 0.7 K/uL   Basophils Relative 1 %   Basophils Absolute 0.0 0.0 - 0.1 K/uL  Hepatic function panel  Result Value Ref Range   Total Protein 6.7 6.5 - 8.1 g/dL   Albumin 4.0 3.5 - 5.0 g/dL   AST 46 (H) 15 - 41 U/L   ALT 27 14 - 54 U/L   Alkaline Phosphatase 49 38 - 126 U/L   Total Bilirubin 0.5 0.3 - 1.2 mg/dL   Bilirubin, Direct 0.1 0.1 - 0.5 mg/dL   Indirect Bilirubin 0.4 0.3 - 0.9 mg/dL  Lipid Profile  Result Value Ref Range   Cholesterol 117 0 - 200  mg/dL   Triglycerides 108 <150 mg/dL   HDL 31 (L) >40 mg/dL   Total CHOL/HDL Ratio 3.8 RATIO   VLDL 22 0 - 40 mg/dL   LDL Cholesterol 64 0 - 99 mg/dL      Assessment & Plan:     -reviewed labs with pt -pt is not out of any of her medications -pt to establish care with new PCP now tht she has medicare.  She says she plans to return to [facility] where she was a pt in the past

## 2017-02-16 DIAGNOSIS — Z0000 Encounter for general adult medical examination without abnormal findings: Secondary | ICD-10-CM | POA: Diagnosis not present

## 2017-02-16 DIAGNOSIS — Z299 Encounter for prophylactic measures, unspecified: Secondary | ICD-10-CM | POA: Diagnosis not present

## 2017-02-16 DIAGNOSIS — F17210 Nicotine dependence, cigarettes, uncomplicated: Secondary | ICD-10-CM | POA: Diagnosis not present

## 2017-02-16 DIAGNOSIS — Z6827 Body mass index (BMI) 27.0-27.9, adult: Secondary | ICD-10-CM | POA: Diagnosis not present

## 2017-02-16 DIAGNOSIS — Z7189 Other specified counseling: Secondary | ICD-10-CM | POA: Diagnosis not present

## 2017-02-16 DIAGNOSIS — Z1331 Encounter for screening for depression: Secondary | ICD-10-CM | POA: Diagnosis not present

## 2017-02-16 DIAGNOSIS — Z1211 Encounter for screening for malignant neoplasm of colon: Secondary | ICD-10-CM | POA: Diagnosis not present

## 2017-02-17 DIAGNOSIS — F419 Anxiety disorder, unspecified: Secondary | ICD-10-CM | POA: Diagnosis not present

## 2017-02-17 DIAGNOSIS — E782 Mixed hyperlipidemia: Secondary | ICD-10-CM | POA: Diagnosis not present

## 2017-02-17 DIAGNOSIS — Z79899 Other long term (current) drug therapy: Secondary | ICD-10-CM | POA: Diagnosis not present

## 2017-03-02 DIAGNOSIS — Z9049 Acquired absence of other specified parts of digestive tract: Secondary | ICD-10-CM | POA: Diagnosis not present

## 2017-03-02 DIAGNOSIS — K7460 Unspecified cirrhosis of liver: Secondary | ICD-10-CM | POA: Diagnosis not present

## 2017-03-05 DIAGNOSIS — E2839 Other primary ovarian failure: Secondary | ICD-10-CM | POA: Diagnosis not present

## 2017-03-06 DIAGNOSIS — Z1231 Encounter for screening mammogram for malignant neoplasm of breast: Secondary | ICD-10-CM | POA: Diagnosis not present

## 2017-04-22 ENCOUNTER — Ambulatory Visit (INDEPENDENT_AMBULATORY_CARE_PROVIDER_SITE_OTHER): Payer: Medicare Other | Admitting: Gastroenterology

## 2017-04-22 ENCOUNTER — Encounter: Payer: Self-pay | Admitting: Gastroenterology

## 2017-04-22 VITALS — BP 110/70 | HR 87 | Temp 97.7°F | Ht 65.0 in | Wt 155.8 lb

## 2017-04-22 DIAGNOSIS — R1013 Epigastric pain: Secondary | ICD-10-CM | POA: Diagnosis not present

## 2017-04-22 DIAGNOSIS — K219 Gastro-esophageal reflux disease without esophagitis: Secondary | ICD-10-CM

## 2017-04-22 DIAGNOSIS — R198 Other specified symptoms and signs involving the digestive system and abdomen: Secondary | ICD-10-CM

## 2017-04-22 MED ORDER — RABEPRAZOLE SODIUM 20 MG PO TBEC: 20.0000 mg | DELAYED_RELEASE_TABLET | Freq: Every day | ORAL | 5 refills | Status: DC

## 2017-04-22 NOTE — Patient Instructions (Signed)
1. I have sent in RX for Aciphex 20mg  daily before breakfast. Let me know if it is too expensive. Dexilant samples provided in case there is a delay in getting Aciphex covered. 2. I will review records of ultrasound and labs from PCP. 3. Call in 2 weeks and let me know how you are doing. If persistent symptoms you may require further work up.  4. If you continue to have fluctuating stools after being on Aciphex for two weeks, we can try bentyl to cut down on the diarrhea. Just let me know. 5. Return to the office in six weeks.

## 2017-04-22 NOTE — Assessment & Plan Note (Signed)
Postprandial fullness/discomfort in upper abdomen. Will add PPI. She has h/o splenomegaly, review recent ultrasound to determine stability.

## 2017-04-22 NOTE — Progress Notes (Signed)
Primary Care Physician: Glenda Chroman, MD  Primary Gastroenterologist:  Garfield Cornea, MD   Chief Complaint  Patient presents with  . Gastroesophageal Reflux  . Diarrhea    with mucous  . Emesis    past 3 days    HPI: Destiny Villanueva is a 57 y.o. female here for follow-up.  She was last seen in June of 2.  May 2017 underwent EGD and colonoscopy.She had 3 pedunculated polyps removed from the ascending colon, tubular adenomas. Internal hemorrhoids. Next colonoscopy in 3 years. Upper endoscopy with moderate Schatzki ring not manipulated, medium-sized hh, gastritis but no H.pylori.  CT abdomen pelvis with contrast June 2018 with suspected early fatty liver.  Enlarged spleen measuring 17 cm.  Right kidney stones.  She was referred to hematology for stable mild splenomegaly and chronic thrombocytopenia without evidence of cirrhosis on CT or EGD.  Continues to follow with hematology every 6 months, last visit July 2018.  Persistent epigastric pain/fullness for months. Trying OTC without relief.  Taking Zantac daily does not help, takes it as needed only.  Dexilant helped for a little while but quit working. Had vomiting most days. Worse with food. Reflux causes cough and eventually vomits. A lot of burning in the chest. No hematemesis. No esophageal dysphagia. Failed prilosec, prevacid, nexium, protonix, dexilant, zantac. Avoids fast food, fried food. Only caffeine is morning coffee. No carbonated beverage. No tomato based foods.   Alternate between constipation/diarrhea. More diarrhea. Brbpr from hemorrhoids at times.   No NSAIDS/ASA.  Reports labs and abdominal u/s by PCP within last few months.   Current Outpatient Medications  Medication Sig Dispense Refill  . albuterol (PROVENTIL HFA;VENTOLIN HFA) 108 (90 Base) MCG/ACT inhaler Inhale 2 puffs into the lungs every 6 (six) hours as needed for wheezing or shortness of breath. 3 Inhaler 0  . albuterol (PROVENTIL) (2.5 MG/3ML) 0.083%  nebulizer solution Take 3 mLs (2.5 mg total) by nebulization every 6 (six) hours as needed for wheezing or shortness of breath. 75 mL 12  . citalopram (CELEXA) 20 MG tablet Take 20 mg by mouth daily.    . fluticasone (FLONASE) 50 MCG/ACT nasal spray Place into both nostrils daily as needed for allergies or rhinitis.    . Fluticasone Furoate-Vilanterol (BREO ELLIPTA) 100-25 MCG/INH AEPB Inhale 1 puff into the lungs daily. 1 each 5  . lovastatin (MEVACOR) 20 MG tablet Take 1 tablet (20 mg total) by mouth at bedtime. 30 tablet 3  . ranitidine (ZANTAC) 75 MG tablet Take 75 mg by mouth as needed for heartburn.     No current facility-administered medications for this visit.     Allergies as of 04/22/2017 - Review Complete 04/22/2017  Allergen Reaction Noted  . Aspirin  07/03/2014  . Ciprofloxacin  07/03/2014  . Penicillins  07/03/2014    ROS:  General: Negative for anorexia, weight loss, fever, chills, fatigue, weakness. ENT: Negative for hoarseness, difficulty swallowing , nasal congestion. CV: Negative for chest pain, angina, palpitations, dyspnea on exertion, peripheral edema.  Respiratory: Negative for dyspnea at rest, dyspnea on exertion, cough, sputum, wheezing.  GI: See history of present illness. GU:  Negative for dysuria, hematuria, urinary incontinence, urinary frequency, nocturnal urination.  Endo: Negative for unusual weight change.    Physical Examination:   BP 110/70   Pulse 87   Temp 97.7 F (36.5 C) (Oral)   Ht 5\' 5"  (1.651 m)   Wt 155 lb 12.8 oz (70.7 kg)   BMI 25.93  kg/m   General: Well-nourished, well-developed in no acute distress.  Eyes: No icterus. Mouth: Oropharyngeal mucosa moist and pink , no lesions erythema or exudate. Lungs: Clear to auscultation bilaterally.  Heart: Regular rate and rhythm, no murmurs rubs or gallops.  Abdomen: Bowel sounds are normal, mild epigastric and left sided abd tenderness, nondistended, no hepatosplenomegaly or masses, no  abdominal bruits or hernia , no rebound or guarding.   Extremities: No lower extremity edema. No clubbing or deformities. Neuro: Alert and oriented x 4   Skin: Warm and dry, no jaundice.   Psych: Alert and cooperative, normal mood and affect.  Labs:  Lab Results  Component Value Date   CREATININE 0.70 09/29/2016   BUN 12 09/29/2016   NA 136 09/29/2016   K 4.2 09/29/2016   CL 101 09/29/2016   CO2 28 09/29/2016   Lab Results  Component Value Date   ALT 27 12/25/2016   AST 46 (H) 12/25/2016   ALKPHOS 49 12/25/2016   BILITOT 0.5 12/25/2016   Lab Results  Component Value Date   WBC 4.1 12/25/2016   HGB 13.1 12/25/2016   HCT 39.4 12/25/2016   MCV 84.9 12/25/2016   PLT 103 (L) 12/25/2016   Imaging Studies: No results found.

## 2017-04-22 NOTE — Assessment & Plan Note (Signed)
Suspected IBS. With plans to add Aciphex for GERD, we will wait couple of weeks to see where things settle out. Consider bentyl if ongoing symptoms.

## 2017-04-22 NOTE — Assessment & Plan Note (Signed)
Trial of aciphex 20mg  daily. Samples of dexilant provided in case there are any coverage issues. Call with PR in 2 weeks. Return to the office in six weeks.

## 2017-04-23 NOTE — Progress Notes (Signed)
CC'ED TO PCP 

## 2017-04-24 ENCOUNTER — Telehealth: Payer: Self-pay

## 2017-04-24 NOTE — Telephone Encounter (Signed)
PA for Rabeprazole Sodium was started on 04/23/2017 and approved through [email or web address].   Pt notified of approval. Approval letter will be scanned in chart once received from [email or web address]

## 2017-05-20 ENCOUNTER — Other Ambulatory Visit (HOSPITAL_COMMUNITY): Payer: Self-pay | Admitting: *Deleted

## 2017-05-20 DIAGNOSIS — D696 Thrombocytopenia, unspecified: Secondary | ICD-10-CM

## 2017-05-21 ENCOUNTER — Ambulatory Visit (HOSPITAL_COMMUNITY): Payer: Self-pay | Admitting: Adult Health

## 2017-05-21 ENCOUNTER — Encounter (HOSPITAL_COMMUNITY): Payer: Self-pay | Admitting: Adult Health

## 2017-05-21 ENCOUNTER — Inpatient Hospital Stay (HOSPITAL_BASED_OUTPATIENT_CLINIC_OR_DEPARTMENT_OTHER): Payer: Medicare Other | Admitting: Adult Health

## 2017-05-21 ENCOUNTER — Inpatient Hospital Stay (HOSPITAL_COMMUNITY): Payer: Medicare Other | Attending: Oncology

## 2017-05-21 ENCOUNTER — Other Ambulatory Visit: Payer: Self-pay

## 2017-05-21 VITALS — BP 106/73 | HR 71 | Temp 98.0°F | Resp 16 | Ht 65.0 in | Wt 155.4 lb

## 2017-05-21 DIAGNOSIS — K5900 Constipation, unspecified: Secondary | ICD-10-CM | POA: Diagnosis not present

## 2017-05-21 DIAGNOSIS — R5382 Chronic fatigue, unspecified: Secondary | ICD-10-CM | POA: Diagnosis not present

## 2017-05-21 DIAGNOSIS — D696 Thrombocytopenia, unspecified: Secondary | ICD-10-CM | POA: Diagnosis not present

## 2017-05-21 DIAGNOSIS — R531 Weakness: Secondary | ICD-10-CM | POA: Diagnosis not present

## 2017-05-21 DIAGNOSIS — K219 Gastro-esophageal reflux disease without esophagitis: Secondary | ICD-10-CM

## 2017-05-21 LAB — CBC WITH DIFFERENTIAL/PLATELET
BASOS ABS: 0 10*3/uL (ref 0.0–0.1)
BASOS PCT: 1 %
EOS ABS: 0.1 10*3/uL (ref 0.0–0.7)
EOS PCT: 1 %
HCT: 42.4 % (ref 36.0–46.0)
Hemoglobin: 13.1 g/dL (ref 12.0–15.0)
Lymphocytes Relative: 30 %
Lymphs Abs: 1.1 10*3/uL (ref 0.7–4.0)
MCH: 26.4 pg (ref 26.0–34.0)
MCHC: 30.9 g/dL (ref 30.0–36.0)
MCV: 85.5 fL (ref 78.0–100.0)
MONO ABS: 0.3 10*3/uL (ref 0.1–1.0)
MONOS PCT: 8 %
Neutro Abs: 2.3 10*3/uL (ref 1.7–7.7)
Neutrophils Relative %: 60 %
Platelets: 125 10*3/uL — ABNORMAL LOW (ref 150–400)
RBC: 4.96 MIL/uL (ref 3.87–5.11)
RDW: 12.9 % (ref 11.5–15.5)
WBC: 3.8 10*3/uL — ABNORMAL LOW (ref 4.0–10.5)

## 2017-05-21 NOTE — Patient Instructions (Signed)
[place] at [facility] Discharge Instructions  RECOMMENDATIONS MADE BY THE CONSULTANT AND ANY TEST RESULTS WILL BE SENT TO YOUR REFERRING PHYSICIAN.  You were seen today by Mike Craze, NP Labs in 6 months Follow up in 1 year with labs.  Thank you for choosing [place] at [facility] to provide your oncology and hematology care.  To afford each patient quality time with our provider, please arrive at least 15 minutes before your scheduled appointment time.    If you have a lab appointment with the [place] please come in thru the  Main Entrance and check in at the main information desk  You need to re-schedule your appointment should you arrive 10 or more minutes late.  We strive to give you quality time with our providers, and arriving late affects you and other patients whose appointments are after yours.  Also, if you no show three or more times for appointments you may be dismissed from the clinic at the providers discretion.     Again, thank you for choosing [facility].  Our hope is that these requests will decrease the amount of time that you wait before being seen by our physicians.       _____________________________________________________________  Should you have questions after your visit to [facility], please contact our office at [phone] between the hours of 8:30 a.m. and 4:30 p.m.  Voicemails left after 4:30 p.m. will not be returned until the following business day.  For prescription refill requests, have your pharmacy contact our office.       Resources For Cancer Patients and their Caregivers ? American Cancer Society: Can assist with transportation, wigs, general needs, runs Look Good Feel Better.        ([phone] ? Cancer Care: Provides financial assistance, online support groups, medication/co-pay assistance.  1-800-813-HOPE (914) 577-2994) ? [place] Assists [place] cancer patients and their families through emotional , educational and financial support.  906-410-6490 ? [place] DSS Where to apply for food stamps, Medicaid and utility assistance. [phone] ? RCATS: Transportation to medical appointments. 732-122-0998 ? Social Security Administration: May apply for disability if have a Stage IV cancer. [phone] ? [employer], Disability and Transit Services: Assists with nutrition, care and transit needs. [place] Support Programs: @10RELATIVEDAYS @ > Cancer Support Group  2nd Tuesday of the month 1pm-2pm, Journey Room  > Creative Journey  3rd Tuesday of the month 1130am-1pm, Journey Room  > Look Good Feel Better  1st Wednesday of the month 10am-12 noon, Journey Room (Call [place] to register [phone])
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
## 2017-06-08 DIAGNOSIS — J449 Chronic obstructive pulmonary disease, unspecified: Secondary | ICD-10-CM | POA: Diagnosis not present

## 2017-06-08 DIAGNOSIS — F329 Major depressive disorder, single episode, unspecified: Secondary | ICD-10-CM | POA: Diagnosis not present

## 2017-06-08 DIAGNOSIS — Z6825 Body mass index (BMI) 25.0-25.9, adult: Secondary | ICD-10-CM | POA: Diagnosis not present

## 2017-06-08 DIAGNOSIS — F419 Anxiety disorder, unspecified: Secondary | ICD-10-CM | POA: Diagnosis not present

## 2017-06-08 DIAGNOSIS — Z299 Encounter for prophylactic measures, unspecified: Secondary | ICD-10-CM | POA: Diagnosis not present

## 2017-06-08 DIAGNOSIS — F17210 Nicotine dependence, cigarettes, uncomplicated: Secondary | ICD-10-CM | POA: Diagnosis not present

## 2017-11-05 ENCOUNTER — Other Ambulatory Visit: Payer: Self-pay | Admitting: Gastroenterology

## 2017-11-17 ENCOUNTER — Other Ambulatory Visit (HOSPITAL_COMMUNITY): Payer: Self-pay

## 2017-11-17 DIAGNOSIS — D696 Thrombocytopenia, unspecified: Secondary | ICD-10-CM

## 2017-11-18 ENCOUNTER — Inpatient Hospital Stay (HOSPITAL_COMMUNITY): Payer: Medicare Other | Attending: Hematology

## 2017-11-18 DIAGNOSIS — D696 Thrombocytopenia, unspecified: Secondary | ICD-10-CM | POA: Insufficient documentation

## 2017-11-18 LAB — CBC WITH DIFFERENTIAL/PLATELET
BASOS PCT: 0 %
Basophils Absolute: 0 10*3/uL (ref 0.0–0.1)
EOS ABS: 0.1 10*3/uL (ref 0.0–0.7)
Eosinophils Relative: 2 %
HCT: 41 % (ref 36.0–46.0)
HEMOGLOBIN: 13.4 g/dL (ref 12.0–15.0)
Lymphocytes Relative: 36 %
Lymphs Abs: 1.7 10*3/uL (ref 0.7–4.0)
MCH: 27.9 pg (ref 26.0–34.0)
MCHC: 32.7 g/dL (ref 30.0–36.0)
MCV: 85.2 fL (ref 78.0–100.0)
MONOS PCT: 7 %
Monocytes Absolute: 0.4 10*3/uL (ref 0.1–1.0)
NEUTROS PCT: 55 %
Neutro Abs: 2.6 10*3/uL (ref 1.7–7.7)
PLATELETS: 113 10*3/uL — AB (ref 150–400)
RBC: 4.81 MIL/uL (ref 3.87–5.11)
RDW: 13.4 % (ref 11.5–15.5)
WBC: 4.8 10*3/uL (ref 4.0–10.5)

## 2017-11-18 LAB — COMPREHENSIVE METABOLIC PANEL
ALK PHOS: 54 U/L (ref 38–126)
ALT: 19 U/L (ref 0–44)
ANION GAP: 7 (ref 5–15)
AST: 34 U/L (ref 15–41)
Albumin: 4.1 g/dL (ref 3.5–5.0)
BUN: 11 mg/dL (ref 6–20)
CO2: 27 mmol/L (ref 22–32)
Calcium: 9.3 mg/dL (ref 8.9–10.3)
Chloride: 106 mmol/L (ref 98–111)
Creatinine, Ser: 0.69 mg/dL (ref 0.44–1.00)
GFR calc Af Amer: >60 mL/min (ref >60)
GFR calc non Af Amer: >60 mL/min (ref >60)
Glucose, Bld: 104 mg/dL — ABNORMAL HIGH (ref 70–99)
Potassium: 4.2 mmol/L (ref 3.5–5.1)
SODIUM: 140 mmol/L (ref 135–145)
TOTAL PROTEIN: 7 g/dL (ref 6.5–8.1)
Total Bilirubin: 0.6 mg/dL (ref 0.3–1.2)

## 2017-11-23 ENCOUNTER — Telehealth: Payer: Self-pay | Admitting: Pulmonary Disease

## 2017-11-23 DIAGNOSIS — Z122 Encounter for screening for malignant neoplasm of respiratory organs: Secondary | ICD-10-CM

## 2017-11-23 DIAGNOSIS — J449 Chronic obstructive pulmonary disease, unspecified: Secondary | ICD-10-CM | POA: Diagnosis not present

## 2017-11-23 DIAGNOSIS — K219 Gastro-esophageal reflux disease without esophagitis: Secondary | ICD-10-CM | POA: Diagnosis not present

## 2017-11-23 DIAGNOSIS — Z6825 Body mass index (BMI) 25.0-25.9, adult: Secondary | ICD-10-CM | POA: Diagnosis not present

## 2017-11-23 DIAGNOSIS — R079 Chest pain, unspecified: Secondary | ICD-10-CM | POA: Diagnosis not present

## 2017-11-23 DIAGNOSIS — Z299 Encounter for prophylactic measures, unspecified: Secondary | ICD-10-CM | POA: Diagnosis not present

## 2017-11-23 DIAGNOSIS — K7460 Unspecified cirrhosis of liver: Secondary | ICD-10-CM | POA: Diagnosis not present

## 2017-11-23 DIAGNOSIS — F17210 Nicotine dependence, cigarettes, uncomplicated: Principal | ICD-10-CM

## 2017-11-25 NOTE — Telephone Encounter (Signed)
Routing message to denise and Rodena Piety to f/u with scheduling lung screening appt.

## 2017-11-26 NOTE — Telephone Encounter (Signed)
I left message for the patient to return call to schedule this CT

## 2017-11-30 DIAGNOSIS — R079 Chest pain, unspecified: Secondary | ICD-10-CM | POA: Diagnosis not present

## 2017-12-11 ENCOUNTER — Ambulatory Visit (HOSPITAL_COMMUNITY)
Admission: RE | Admit: 2017-12-11 | Discharge: 2017-12-11 | Disposition: A | Discharge status: Home / Self Care | Payer: Medicare Other | Source: Ambulatory Visit | Attending: Pulmonary Disease | Admitting: Pulmonary Disease

## 2017-12-11 DIAGNOSIS — I700 Atherosclerosis of aorta: Secondary | ICD-10-CM | POA: Diagnosis not present

## 2017-12-11 DIAGNOSIS — J432 Centrilobular emphysema: Secondary | ICD-10-CM | POA: Diagnosis not present

## 2017-12-11 DIAGNOSIS — F17210 Nicotine dependence, cigarettes, uncomplicated: Secondary | ICD-10-CM | POA: Diagnosis not present

## 2017-12-11 DIAGNOSIS — J438 Other emphysema: Secondary | ICD-10-CM | POA: Diagnosis not present

## 2017-12-11 DIAGNOSIS — Z87891 Personal history of nicotine dependence: Secondary | ICD-10-CM | POA: Diagnosis not present

## 2017-12-15 ENCOUNTER — Telehealth: Payer: Self-pay | Admitting: Pulmonary Disease

## 2017-12-15 NOTE — Telephone Encounter (Signed)
Patient made aware of results and verbalized understanding. Nothing further needed at this time.

## 2017-12-15 NOTE — Telephone Encounter (Signed)
Attempted to call patient today regarding results. I did not receive an answer at time of call. I have left a voicemail message for pt to return call. X1  

## 2017-12-15 NOTE — Telephone Encounter (Signed)
CT chest lung cancer screening 12/11/17 >> centrilobular and paraseptal emphysema, tiny b/l calcified nodules   Please let her know CT chest is stable.  No new worrisome findings.  She need f/u CT chest in August 2020 to continue screening program.

## 2017-12-15 NOTE — Telephone Encounter (Signed)
Patient is returning call. CB is [phone].

## 2017-12-16 ENCOUNTER — Other Ambulatory Visit: Payer: Self-pay | Admitting: Acute Care

## 2017-12-16 DIAGNOSIS — F17210 Nicotine dependence, cigarettes, uncomplicated: Principal | ICD-10-CM

## 2017-12-16 DIAGNOSIS — Z122 Encounter for screening for malignant neoplasm of respiratory organs: Secondary | ICD-10-CM

## 2018-01-06 ENCOUNTER — Encounter: Payer: Self-pay | Admitting: Cardiovascular Disease

## 2018-01-06 ENCOUNTER — Ambulatory Visit (INDEPENDENT_AMBULATORY_CARE_PROVIDER_SITE_OTHER): Payer: Medicare Other | Admitting: Cardiovascular Disease

## 2018-01-06 ENCOUNTER — Encounter: Payer: Self-pay | Admitting: *Deleted

## 2018-01-06 VITALS — BP 100/58 | HR 108 | Ht 65.0 in | Wt 160.0 lb

## 2018-01-06 DIAGNOSIS — R5383 Other fatigue: Secondary | ICD-10-CM | POA: Diagnosis not present

## 2018-01-06 DIAGNOSIS — R079 Chest pain, unspecified: Secondary | ICD-10-CM | POA: Diagnosis not present

## 2018-01-06 DIAGNOSIS — Z01812 Encounter for preprocedural laboratory examination: Secondary | ICD-10-CM | POA: Diagnosis not present

## 2018-01-06 DIAGNOSIS — Z720 Tobacco use: Secondary | ICD-10-CM

## 2018-01-06 DIAGNOSIS — I2510 Atherosclerotic heart disease of native coronary artery without angina pectoris: Secondary | ICD-10-CM | POA: Diagnosis not present

## 2018-01-06 MED ORDER — METOPROLOL TARTRATE 25 MG PO TABS: 25.0000 mg | ORAL_TABLET | Freq: Two times a day (BID) | ORAL | 6 refills | Status: DC

## 2018-01-06 NOTE — Patient Instructions (Addendum)
Medication Instructions:   Begin Lopressor 25mg  twice a day.  Continue all other medications.    Labwork: BMET - order given today.   Testing/Procedures:  Coronary CTA at [facility].  Office will contact with results via phone or letter.    Follow-Up: To be determined.    Any Other Special Instructions Will Be Listed Below (If Applicable).  If you need a refill on your cardiac medications before your next appointment, please call your pharmacy.
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
## 2018-01-29 ENCOUNTER — Other Ambulatory Visit: Payer: Self-pay | Admitting: *Deleted

## 2018-01-29 DIAGNOSIS — I209 Angina pectoris, unspecified: Secondary | ICD-10-CM

## 2018-02-04 DIAGNOSIS — Z01812 Encounter for preprocedural laboratory examination: Secondary | ICD-10-CM | POA: Diagnosis not present

## 2018-02-04 DIAGNOSIS — R079 Chest pain, unspecified: Secondary | ICD-10-CM | POA: Diagnosis not present

## 2018-02-05 LAB — SPECIMEN COMPROMISED

## 2018-02-05 LAB — BASIC METABOLIC PANEL
BUN: 10 mg/dL (ref 7–25)
CALCIUM: 8.9 mg/dL (ref 8.6–10.4)
CO2: 27 mmol/L (ref 20–32)
Chloride: 107 mmol/L (ref 98–110)
Creat: 0.8 mg/dL (ref 0.50–1.05)
GLUCOSE: 93 mg/dL (ref 65–99)
Potassium: 4.4 mmol/L (ref 3.5–5.3)
SODIUM: 140 mmol/L (ref 135–146)

## 2018-02-08 ENCOUNTER — Telehealth: Payer: Self-pay | Admitting: *Deleted

## 2018-02-08 NOTE — Telephone Encounter (Signed)
Notes recorded by Laurine Blazer, LPN on 35/45/6256 at 5:01 PM EDT Patient notified. Copy to pmd. ------  Notes recorded by Laurine Blazer, LPN on 38/93/7342 at 4:13 PM EDT Left message to return call.  ------  Notes recorded by Herminio Commons, MD on 02/05/2018 at 12:37 PM EDT All values are normal or within acceptable limits.  Medication changes / Follow up labs / Other changes or recommendations:  None. Kate Sable, MD 02/05/2018 12:37 PM

## 2018-02-12 ENCOUNTER — Ambulatory Visit (HOSPITAL_COMMUNITY)
Admission: RE | Admit: 2018-02-12 | Discharge: 2018-02-12 | Disposition: A | Discharge status: Home / Self Care | Payer: Medicare Other | Source: Ambulatory Visit | Attending: Cardiovascular Disease | Admitting: Cardiovascular Disease

## 2018-02-12 DIAGNOSIS — I209 Angina pectoris, unspecified: Secondary | ICD-10-CM | POA: Diagnosis not present

## 2018-02-12 DIAGNOSIS — R161 Splenomegaly, not elsewhere classified: Secondary | ICD-10-CM | POA: Diagnosis not present

## 2018-02-12 DIAGNOSIS — Z006 Encounter for examination for normal comparison and control in clinical research program: Secondary | ICD-10-CM

## 2018-02-12 MED ORDER — IOPAMIDOL (ISOVUE-370) INJECTION 76%
80.0000 mL | Freq: Once | INTRAVENOUS | Status: AC | PRN
  Administered 2018-02-12: 80 mL via INTRAVENOUS

## 2018-02-12 MED ORDER — NITROGLYCERIN 0.4 MG SL SUBL
SUBLINGUAL_TABLET | SUBLINGUAL | Status: AC
  Filled 2018-02-12: qty 2

## 2018-02-12 MED ORDER — NITROGLYCERIN 0.4 MG SL SUBL
0.8000 mg | SUBLINGUAL_TABLET | SUBLINGUAL | Status: DC | PRN
  Administered 2018-02-12: 0.8 mg via SUBLINGUAL

## 2018-02-12 NOTE — Research (Signed)
Subject met inclusion and exclusion criteria.  The informed consent form, study requirements and expectations were reviewed with the subject and questions and concerns were addressed prior to the signing of the consent form.  The subject verbalized understanding of the trial requirements.  The subject agreed to participate in the CADFEM trial and signed the informed consent.  The informed consent was obtained prior to performance of any protocol-specific procedures for the subject.  A copy of the signed informed consent was given to the subject and a copy was placed in the subject's medical record. 

## 2018-02-14 DIAGNOSIS — I209 Angina pectoris, unspecified: Secondary | ICD-10-CM | POA: Diagnosis not present

## 2018-02-19 ENCOUNTER — Telehealth: Payer: Self-pay | Admitting: *Deleted

## 2018-02-19 NOTE — Telephone Encounter (Signed)
Notes recorded by Laurine Blazer, LPN on 84/05/1029 at 2:81 PM EDT Patient notified. Copy to pmd. Follow up scheduled for 04/05/2018 with Estella Husk, PA in [place] office.   ------  Notes recorded by Laurine Blazer, LPN on 18/11/6771 at 7:36 PM EDT Left message to return call.  ------  Notes recorded by Herminio Commons, MD on 02/17/2018 at 4:43 PM EDT This study demonstrates: No significant blockages. Medication changes / Follow up studies / Other recommendations:  None. Please send results to the PCP: Glenda Chroman, MD  Kate Sable, MD 02/17/2018 4:43 PM

## 2018-02-22 DIAGNOSIS — Z0000 Encounter for general adult medical examination without abnormal findings: Secondary | ICD-10-CM | POA: Diagnosis not present

## 2018-02-22 DIAGNOSIS — E782 Mixed hyperlipidemia: Secondary | ICD-10-CM | POA: Diagnosis not present

## 2018-02-22 DIAGNOSIS — F419 Anxiety disorder, unspecified: Secondary | ICD-10-CM | POA: Diagnosis not present

## 2018-02-22 DIAGNOSIS — Z299 Encounter for prophylactic measures, unspecified: Secondary | ICD-10-CM | POA: Diagnosis not present

## 2018-02-22 DIAGNOSIS — Z7189 Other specified counseling: Secondary | ICD-10-CM | POA: Diagnosis not present

## 2018-02-22 DIAGNOSIS — Z6825 Body mass index (BMI) 25.0-25.9, adult: Secondary | ICD-10-CM | POA: Diagnosis not present

## 2018-02-22 DIAGNOSIS — R5383 Other fatigue: Secondary | ICD-10-CM | POA: Diagnosis not present

## 2018-02-22 DIAGNOSIS — Z1211 Encounter for screening for malignant neoplasm of colon: Secondary | ICD-10-CM | POA: Diagnosis not present

## 2018-02-22 DIAGNOSIS — F17210 Nicotine dependence, cigarettes, uncomplicated: Secondary | ICD-10-CM | POA: Diagnosis not present

## 2018-02-22 DIAGNOSIS — Z1331 Encounter for screening for depression: Secondary | ICD-10-CM | POA: Diagnosis not present

## 2018-02-22 DIAGNOSIS — Z1339 Encounter for screening examination for other mental health and behavioral disorders: Secondary | ICD-10-CM | POA: Diagnosis not present

## 2018-03-03 DIAGNOSIS — Z79899 Other long term (current) drug therapy: Secondary | ICD-10-CM | POA: Diagnosis not present

## 2018-03-03 DIAGNOSIS — R5383 Other fatigue: Secondary | ICD-10-CM | POA: Diagnosis not present

## 2018-03-03 DIAGNOSIS — E782 Mixed hyperlipidemia: Secondary | ICD-10-CM | POA: Diagnosis not present

## 2018-03-03 DIAGNOSIS — F419 Anxiety disorder, unspecified: Secondary | ICD-10-CM | POA: Diagnosis not present

## 2018-03-31 DIAGNOSIS — Z299 Encounter for prophylactic measures, unspecified: Secondary | ICD-10-CM | POA: Diagnosis not present

## 2018-03-31 DIAGNOSIS — R079 Chest pain, unspecified: Secondary | ICD-10-CM | POA: Insufficient documentation

## 2018-03-31 DIAGNOSIS — J449 Chronic obstructive pulmonary disease, unspecified: Secondary | ICD-10-CM | POA: Diagnosis not present

## 2018-03-31 DIAGNOSIS — Z6827 Body mass index (BMI) 27.0-27.9, adult: Secondary | ICD-10-CM | POA: Diagnosis not present

## 2018-03-31 DIAGNOSIS — F17210 Nicotine dependence, cigarettes, uncomplicated: Secondary | ICD-10-CM | POA: Diagnosis not present

## 2018-03-31 DIAGNOSIS — J329 Chronic sinusitis, unspecified: Secondary | ICD-10-CM | POA: Diagnosis not present
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
## 2018-04-05 ENCOUNTER — Ambulatory Visit: Payer: Medicare Other | Admitting: Physician Assistant

## 2018-05-07 ENCOUNTER — Encounter: Payer: Self-pay | Admitting: Student

## 2018-05-07 ENCOUNTER — Ambulatory Visit: Payer: Medicare Other | Admitting: Student

## 2018-05-07 VITALS — BP 116/64 | HR 88 | Ht 65.0 in | Wt 168.0 lb

## 2018-05-07 DIAGNOSIS — R0789 Other chest pain: Secondary | ICD-10-CM | POA: Diagnosis not present

## 2018-05-07 DIAGNOSIS — Z720 Tobacco use: Secondary | ICD-10-CM | POA: Diagnosis not present

## 2018-05-07 DIAGNOSIS — E785 Hyperlipidemia, unspecified: Secondary | ICD-10-CM

## 2018-05-07 DIAGNOSIS — I2510 Atherosclerotic heart disease of native coronary artery without angina pectoris: Secondary | ICD-10-CM | POA: Diagnosis not present

## 2018-05-07 MED ORDER — METOPROLOL TARTRATE 25 MG PO TABS: 25.0000 mg | ORAL_TABLET | Freq: Two times a day (BID) | ORAL | 3 refills | Status: DC
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
## 2018-05-07 NOTE — Patient Instructions (Signed)
Medication Instructions:  Your physician recommends that you continue on your current medications as directed. Please refer to the Current Medication list given to you today.  If you need a refill on your cardiac medications before your next appointment, please call your pharmacy.   Lab work: NONE  If you have labs (blood work) drawn today and your tests are completely normal, you will receive your results only by: . MyChart Message (if you have MyChart) OR . A paper copy in the mail If you have any lab test that is abnormal or we need to change your treatment, we will call you to review the results.  Testing/Procedures: NONE   Follow-Up: At [facility], you and your health needs are our priority.  As part of our continuing mission to provide you with exceptional heart care, we have created designated Provider Care Teams.  These Care Teams include your primary Cardiologist (physician) and Advanced Practice Providers (APPs -  Physician Assistants and Nurse Practitioners) who all work together to provide you with the care you need, when you need it. You will need a follow up appointment in 1 years.  Please call our office 2 months in advance to schedule this appointment.  You may see Suresh Koneswaran, MD or one of the following Advanced Practice Providers on your designated Care Team:   Brittany Strader, PA-C ([place] Office) . Michele Lenze, PA-C ([place] Office)  Any Other Special Instructions Will Be Listed Below (If Applicable). Thank you for choosing [facility]!     

## 2018-05-25 ENCOUNTER — Ambulatory Visit (HOSPITAL_COMMUNITY): Payer: Medicare Other

## 2018-05-25 ENCOUNTER — Other Ambulatory Visit (HOSPITAL_COMMUNITY): Payer: Medicare Other

## 2018-05-25 ENCOUNTER — Ambulatory Visit (HOSPITAL_COMMUNITY): Payer: Medicare Other | Admitting: Hematology

## 2018-08-26 ENCOUNTER — Encounter: Payer: Self-pay | Admitting: Internal Medicine

## 2018-09-29 DIAGNOSIS — N952 Postmenopausal atrophic vaginitis: Secondary | ICD-10-CM | POA: Diagnosis not present

## 2018-09-29 DIAGNOSIS — L0291 Cutaneous abscess, unspecified: Secondary | ICD-10-CM | POA: Diagnosis not present

## 2018-09-29 DIAGNOSIS — Z299 Encounter for prophylactic measures, unspecified: Secondary | ICD-10-CM | POA: Diagnosis not present

## 2018-09-29 DIAGNOSIS — Z6828 Body mass index (BMI) 28.0-28.9, adult: Secondary | ICD-10-CM | POA: Diagnosis not present

## 2018-09-29 DIAGNOSIS — J449 Chronic obstructive pulmonary disease, unspecified: Secondary | ICD-10-CM | POA: Diagnosis not present

## 2018-09-29 DIAGNOSIS — Z87891 Personal history of nicotine dependence: Secondary | ICD-10-CM | POA: Diagnosis not present

## 2018-09-29 DIAGNOSIS — R300 Dysuria: Secondary | ICD-10-CM | POA: Diagnosis not present

## 2018-10-06 DIAGNOSIS — Z299 Encounter for prophylactic measures, unspecified: Secondary | ICD-10-CM | POA: Diagnosis not present

## 2018-10-06 DIAGNOSIS — L0291 Cutaneous abscess, unspecified: Secondary | ICD-10-CM | POA: Diagnosis not present

## 2018-10-06 DIAGNOSIS — K7460 Unspecified cirrhosis of liver: Secondary | ICD-10-CM | POA: Diagnosis not present

## 2018-10-06 DIAGNOSIS — J449 Chronic obstructive pulmonary disease, unspecified: Secondary | ICD-10-CM | POA: Diagnosis not present

## 2018-10-06 DIAGNOSIS — G4730 Sleep apnea, unspecified: Secondary | ICD-10-CM | POA: Diagnosis not present

## 2018-10-06 DIAGNOSIS — Z6827 Body mass index (BMI) 27.0-27.9, adult: Secondary | ICD-10-CM | POA: Diagnosis not present

## 2018-10-11 DIAGNOSIS — Z6828 Body mass index (BMI) 28.0-28.9, adult: Secondary | ICD-10-CM | POA: Diagnosis not present

## 2018-10-11 DIAGNOSIS — R21 Rash and other nonspecific skin eruption: Secondary | ICD-10-CM | POA: Diagnosis not present

## 2018-10-11 DIAGNOSIS — J449 Chronic obstructive pulmonary disease, unspecified: Secondary | ICD-10-CM | POA: Diagnosis not present

## 2018-10-11 DIAGNOSIS — Z87891 Personal history of nicotine dependence: Secondary | ICD-10-CM | POA: Diagnosis not present

## 2018-10-11 DIAGNOSIS — K7460 Unspecified cirrhosis of liver: Secondary | ICD-10-CM | POA: Diagnosis not present

## 2018-10-11 DIAGNOSIS — Z299 Encounter for prophylactic measures, unspecified: Secondary | ICD-10-CM | POA: Diagnosis not present

## 2018-12-06 DIAGNOSIS — J449 Chronic obstructive pulmonary disease, unspecified: Secondary | ICD-10-CM | POA: Diagnosis not present

## 2018-12-06 DIAGNOSIS — R430 Anosmia: Secondary | ICD-10-CM | POA: Diagnosis not present

## 2018-12-06 DIAGNOSIS — Z299 Encounter for prophylactic measures, unspecified: Secondary | ICD-10-CM | POA: Diagnosis not present

## 2018-12-06 DIAGNOSIS — Z6828 Body mass index (BMI) 28.0-28.9, adult: Secondary | ICD-10-CM | POA: Diagnosis not present

## 2018-12-06 DIAGNOSIS — R05 Cough: Secondary | ICD-10-CM | POA: Diagnosis not present

## 2018-12-06 DIAGNOSIS — R432 Parageusia: Secondary | ICD-10-CM | POA: Diagnosis not present

## 2018-12-13 ENCOUNTER — Other Ambulatory Visit: Payer: Self-pay

## 2018-12-13 ENCOUNTER — Ambulatory Visit (HOSPITAL_COMMUNITY)
Admission: RE | Admit: 2018-12-13 | Discharge: 2018-12-13 | Disposition: A | Discharge status: Home / Self Care | Payer: Medicare Other | Source: Ambulatory Visit | Attending: Acute Care | Admitting: Acute Care

## 2018-12-13 DIAGNOSIS — F17210 Nicotine dependence, cigarettes, uncomplicated: Secondary | ICD-10-CM | POA: Insufficient documentation

## 2018-12-13 DIAGNOSIS — Z122 Encounter for screening for malignant neoplasm of respiratory organs: Secondary | ICD-10-CM | POA: Insufficient documentation

## 2018-12-17 ENCOUNTER — Other Ambulatory Visit: Payer: Self-pay | Admitting: *Deleted

## 2018-12-17 DIAGNOSIS — F17210 Nicotine dependence, cigarettes, uncomplicated: Secondary | ICD-10-CM

## 2018-12-17 DIAGNOSIS — Z122 Encounter for screening for malignant neoplasm of respiratory organs: Secondary | ICD-10-CM

## 2018-12-17 DIAGNOSIS — Z87891 Personal history of nicotine dependence: Secondary | ICD-10-CM

## 2019-02-08 ENCOUNTER — Other Ambulatory Visit: Payer: Self-pay

## 2019-02-08 ENCOUNTER — Encounter: Payer: Self-pay | Admitting: Gastroenterology

## 2019-02-08 ENCOUNTER — Ambulatory Visit: Payer: Medicare Other | Admitting: Gastroenterology

## 2019-02-08 VITALS — BP 113/73 | HR 90 | Temp 98.2°F | Ht 65.0 in | Wt 175.4 lb

## 2019-02-08 DIAGNOSIS — K219 Gastro-esophageal reflux disease without esophagitis: Secondary | ICD-10-CM

## 2019-02-08 DIAGNOSIS — Z86010 Personal history of colonic polyps: Secondary | ICD-10-CM

## 2019-02-08 DIAGNOSIS — R1310 Dysphagia, unspecified: Secondary | ICD-10-CM

## 2019-02-08 DIAGNOSIS — R112 Nausea with vomiting, unspecified: Secondary | ICD-10-CM

## 2019-02-08 DIAGNOSIS — Z860101 Personal history of adenomatous and serrated colon polyps: Secondary | ICD-10-CM

## 2019-02-08 MED ORDER — PEG 3350-KCL-NA BICARB-NACL 420 G PO SOLR: 4000.0000 mL | ORAL | 0 refills | Status: DC

## 2019-02-08 NOTE — Patient Instructions (Signed)
1. Continue Aciphex once daily. Try taking 30 minutes before first meal of day to see if this works better for you. You can continue over the counter antacids as needed for breakthrough symptoms.  2. Upper endoscopy and colonoscopy as scheduled. See separate instructions.
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
## 2019-02-09 ENCOUNTER — Encounter: Payer: Self-pay | Admitting: Gastroenterology

## 2019-02-11 NOTE — Progress Notes (Signed)
Labs September 2020: White blood cell 3400, hemoglobin 12.9, hematocrit 36.7, MCV 83, platelets 152,000, TSH 2.0 creatinine 0.8, sodium 142, albumin 4.4, total bilirubin 0.5, alk phos 53, AST 39, ALT 22

## 2019-02-11 NOTE — Assessment & Plan Note (Signed)
Some breakthrough symptoms.  Encouraged taking AcipHex 20 mg 30 minutes before breakfast.  May continue to use Mylanta as needed.  She is not interested in twice daily PPI due to expense.  Upper endoscopy planned in the near future for history of solid food dysphagia and Schatzki ring not manipulated at last EGD.  Also with history of splenomegaly/fatty liver, reassess for any signs of portal hypertension.  GD with esophageal dilation with propofol.  I have discussed the risks, alternatives, benefits with regards to but not limited to the risk of reaction to medication, bleeding, infection, perforation and the patient is agreeable to proceed. Written consent to be obtained.

## 2019-02-11 NOTE — Assessment & Plan Note (Signed)
Due for surveillance colonoscopy.  Plan for deep sedation.  I have discussed the risks, alternatives, benefits with regards to but not limited to the risk of reaction to medication, bleeding, infection, perforation and the patient is agreeable to proceed. Written consent to be obtained.

## 2019-04-07 ENCOUNTER — Telehealth: Payer: Self-pay | Admitting: Cardiovascular Disease

## 2019-04-07 NOTE — Telephone Encounter (Signed)

## 2019-04-26 DIAGNOSIS — J301 Allergic rhinitis due to pollen: Secondary | ICD-10-CM | POA: Diagnosis not present

## 2019-04-28 DIAGNOSIS — J301 Allergic rhinitis due to pollen: Secondary | ICD-10-CM | POA: Diagnosis not present

## 2019-05-02 ENCOUNTER — Telehealth: Payer: Self-pay | Admitting: *Deleted

## 2019-05-02 NOTE — Telephone Encounter (Signed)
LMOVM

## 2019-05-02 NOTE — Telephone Encounter (Signed)
Patient called back. D/t rise in covid cases we will have to cancel TCS but she can have EGD done. Patient states she wants to wait and have it all done together. Aware will call back. Endo aware.

## 2019-05-03 DIAGNOSIS — J301 Allergic rhinitis due to pollen: Secondary | ICD-10-CM | POA: Diagnosis not present

## 2019-05-05 DIAGNOSIS — J301 Allergic rhinitis due to pollen: Secondary | ICD-10-CM | POA: Diagnosis not present

## 2019-05-09 ENCOUNTER — Telehealth: Payer: Self-pay | Admitting: Licensed Clinical Social Worker

## 2019-05-09 ENCOUNTER — Encounter: Payer: Self-pay | Admitting: Cardiovascular Disease

## 2019-05-09 ENCOUNTER — Telehealth (INDEPENDENT_AMBULATORY_CARE_PROVIDER_SITE_OTHER): Payer: Medicare Other | Admitting: Cardiovascular Disease

## 2019-05-09 VITALS — Ht 65.0 in | Wt 179.0 lb

## 2019-05-09 DIAGNOSIS — I2510 Atherosclerotic heart disease of native coronary artery without angina pectoris: Secondary | ICD-10-CM | POA: Diagnosis not present

## 2019-05-09 DIAGNOSIS — R0789 Other chest pain: Secondary | ICD-10-CM

## 2019-05-09 DIAGNOSIS — Z720 Tobacco use: Secondary | ICD-10-CM | POA: Diagnosis not present

## 2019-05-09 DIAGNOSIS — E785 Hyperlipidemia, unspecified: Secondary | ICD-10-CM

## 2019-05-09 NOTE — Telephone Encounter (Signed)
CSW referred to assist patient with obtaining a BP cuff. CSW contacted patient to inform cuff will be delivered to home. Patient grateful for support and assistance. CSW available as needed. Jackie Maryum Batterson, LCSW, CCSW-MCS [phone]  

## 2019-05-09 NOTE — Patient Instructions (Signed)
Medication Instructions:   STOP Metoprolol  *If you need a refill on your cardiac medications before your next appointment, please call your pharmacy*  Lab Work: NONE If you have labs (blood work) drawn today and your tests are completely normal, you will receive your results only by: Marland Kitchen MyChart Message (if you have MyChart) OR . A paper copy in the mail If you have any lab test that is abnormal or we need to change your treatment, we will call you to review the results.  Testing/Procedures: NONE  Follow-Up:   AS Needed.    Thank you for choosing [facility] !

## 2019-05-09 NOTE — Addendum Note (Signed)
Addended by: Barbarann Ehlers A on: 05/09/2019 12:47 PM   Modules accepted: Orders
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
## 2019-05-10 DIAGNOSIS — J301 Allergic rhinitis due to pollen: Secondary | ICD-10-CM | POA: Diagnosis not present

## 2019-05-12 DIAGNOSIS — J301 Allergic rhinitis due to pollen: Secondary | ICD-10-CM | POA: Diagnosis not present

## 2019-05-13 DIAGNOSIS — M859 Disorder of bone density and structure, unspecified: Secondary | ICD-10-CM | POA: Diagnosis not present

## 2019-05-17 ENCOUNTER — Other Ambulatory Visit (HOSPITAL_COMMUNITY): Payer: Medicare Other

## 2019-05-17 DIAGNOSIS — J301 Allergic rhinitis due to pollen: Secondary | ICD-10-CM | POA: Diagnosis not present

## 2019-05-18 ENCOUNTER — Encounter: Payer: Self-pay | Admitting: *Deleted

## 2019-05-18 ENCOUNTER — Telehealth: Payer: Self-pay | Admitting: *Deleted

## 2019-05-18 NOTE — Telephone Encounter (Signed)
Called pt and she rescheduled her procedure to 08/15/2019.  Pt is aware that I will mail her out prep instructions, Covid screening information, and pre-op information.  Pt voiced understanding.

## 2019-05-19 ENCOUNTER — Ambulatory Visit (HOSPITAL_COMMUNITY): Admit: 2019-05-19 | Payer: Medicare Other | Admitting: Internal Medicine

## 2019-05-19 ENCOUNTER — Encounter (HOSPITAL_COMMUNITY): Payer: Self-pay

## 2019-05-19 ENCOUNTER — Encounter: Payer: Self-pay | Admitting: *Deleted

## 2019-05-19 DIAGNOSIS — J301 Allergic rhinitis due to pollen: Secondary | ICD-10-CM | POA: Diagnosis not present

## 2019-05-19 SURGERY — COLONOSCOPY WITH PROPOFOL
Anesthesia: Monitor Anesthesia Care

## 2019-05-24 DIAGNOSIS — J301 Allergic rhinitis due to pollen: Secondary | ICD-10-CM | POA: Diagnosis not present

## 2019-05-26 DIAGNOSIS — J301 Allergic rhinitis due to pollen: Secondary | ICD-10-CM | POA: Diagnosis not present

## 2019-05-30 ENCOUNTER — Other Ambulatory Visit: Payer: Self-pay | Admitting: *Deleted

## 2019-05-31 ENCOUNTER — Other Ambulatory Visit: Payer: Self-pay

## 2019-05-31 ENCOUNTER — Telehealth: Payer: Self-pay

## 2019-05-31 DIAGNOSIS — J301 Allergic rhinitis due to pollen: Secondary | ICD-10-CM | POA: Diagnosis not present

## 2019-05-31 NOTE — Telephone Encounter (Signed)
PA for EGD/DIL submitted via [facility] website (TCS does not need PA). Case approved. PA#  [id number], valid 08/15/19-11/13/19.

## 2019-06-02 DIAGNOSIS — J301 Allergic rhinitis due to pollen: Secondary | ICD-10-CM | POA: Diagnosis not present

## 2019-06-07 DIAGNOSIS — J301 Allergic rhinitis due to pollen: Secondary | ICD-10-CM | POA: Diagnosis not present

## 2019-06-09 DIAGNOSIS — J301 Allergic rhinitis due to pollen: Secondary | ICD-10-CM | POA: Diagnosis not present

## 2019-06-14 DIAGNOSIS — J301 Allergic rhinitis due to pollen: Secondary | ICD-10-CM | POA: Diagnosis not present

## 2019-06-15 ENCOUNTER — Other Ambulatory Visit: Payer: Self-pay | Admitting: Student

## 2019-06-16 DIAGNOSIS — J301 Allergic rhinitis due to pollen: Secondary | ICD-10-CM | POA: Diagnosis not present

## 2019-06-21 DIAGNOSIS — F17210 Nicotine dependence, cigarettes, uncomplicated: Secondary | ICD-10-CM | POA: Diagnosis not present

## 2019-06-21 DIAGNOSIS — J449 Chronic obstructive pulmonary disease, unspecified: Secondary | ICD-10-CM | POA: Diagnosis not present

## 2019-06-21 DIAGNOSIS — K7460 Unspecified cirrhosis of liver: Secondary | ICD-10-CM | POA: Diagnosis not present

## 2019-06-21 DIAGNOSIS — J301 Allergic rhinitis due to pollen: Secondary | ICD-10-CM | POA: Diagnosis not present

## 2019-06-21 DIAGNOSIS — I2510 Atherosclerotic heart disease of native coronary artery without angina pectoris: Secondary | ICD-10-CM | POA: Diagnosis not present

## 2019-06-21 DIAGNOSIS — Z299 Encounter for prophylactic measures, unspecified: Secondary | ICD-10-CM | POA: Diagnosis not present

## 2019-06-23 DIAGNOSIS — J301 Allergic rhinitis due to pollen: Secondary | ICD-10-CM | POA: Diagnosis not present

## 2019-06-27 DIAGNOSIS — J449 Chronic obstructive pulmonary disease, unspecified: Secondary | ICD-10-CM | POA: Diagnosis not present

## 2019-06-27 DIAGNOSIS — R739 Hyperglycemia, unspecified: Secondary | ICD-10-CM | POA: Diagnosis not present

## 2019-06-27 DIAGNOSIS — Z299 Encounter for prophylactic measures, unspecified: Secondary | ICD-10-CM | POA: Diagnosis not present

## 2019-06-27 DIAGNOSIS — F17210 Nicotine dependence, cigarettes, uncomplicated: Secondary | ICD-10-CM | POA: Diagnosis not present

## 2019-07-01 ENCOUNTER — Other Ambulatory Visit: Payer: Self-pay | Admitting: Student

## 2019-07-07 DIAGNOSIS — J301 Allergic rhinitis due to pollen: Secondary | ICD-10-CM | POA: Diagnosis not present

## 2019-07-12 DIAGNOSIS — J301 Allergic rhinitis due to pollen: Secondary | ICD-10-CM | POA: Diagnosis not present

## 2019-07-14 DIAGNOSIS — J301 Allergic rhinitis due to pollen: Secondary | ICD-10-CM | POA: Diagnosis not present

## 2019-07-19 DIAGNOSIS — J301 Allergic rhinitis due to pollen: Secondary | ICD-10-CM | POA: Diagnosis not present

## 2019-07-20 DIAGNOSIS — J0100 Acute maxillary sinusitis, unspecified: Secondary | ICD-10-CM | POA: Diagnosis not present

## 2019-07-21 DIAGNOSIS — J301 Allergic rhinitis due to pollen: Secondary | ICD-10-CM | POA: Diagnosis not present

## 2019-07-26 DIAGNOSIS — J301 Allergic rhinitis due to pollen: Secondary | ICD-10-CM | POA: Diagnosis not present

## 2019-07-28 ENCOUNTER — Encounter: Payer: Self-pay | Admitting: Gastroenterology

## 2019-07-29 ENCOUNTER — Encounter: Payer: Self-pay | Admitting: *Deleted

## 2019-07-29 DIAGNOSIS — J301 Allergic rhinitis due to pollen: Secondary | ICD-10-CM | POA: Diagnosis not present

## 2019-08-02 DIAGNOSIS — J301 Allergic rhinitis due to pollen: Secondary | ICD-10-CM | POA: Diagnosis not present

## 2019-08-04 DIAGNOSIS — J301 Allergic rhinitis due to pollen: Secondary | ICD-10-CM | POA: Diagnosis not present

## 2019-08-09 DIAGNOSIS — J301 Allergic rhinitis due to pollen: Secondary | ICD-10-CM | POA: Diagnosis not present

## 2019-08-11 DIAGNOSIS — J301 Allergic rhinitis due to pollen: Secondary | ICD-10-CM | POA: Diagnosis not present

## 2019-08-11 NOTE — Patient Instructions (Signed)
Destiny Villanueva  08/11/2019     @PREFPERIOPPHARMACY @   Your procedure is scheduled on  08/15/2019 .  Report to [facility] at  [phone]  A.M.  Call this number if you have problems the morning of surgery:  [phone]   Remember:  Follow the diet and prep instructions given to you by Dr Roseanne Kaufman office.                       Take these medicines the morning of surgery with A SIP OF WATER  Aciphex. Use your nebulizer and your inhaler before you come.    Do not wear jewelry, make-up or nail polish.  Do not wear lotions, powders, or perfumes. Please wear deodorant and brush your teeth.  Do not shave 48 hours prior to surgery.  Men may shave face and neck.  Do not bring valuables to the hospital.  [facility] is not responsible for any belongings or valuables.  Contacts, dentures or bridgework may not be worn into surgery.  Leave your suitcase in the car.  After surgery it may be brought to your room.  For patients admitted to the hospital, discharge time will be determined by your treatment team.  Patients discharged the day of surgery will not be allowed to drive home.   Name and phone number of your driver:   family Special instructions:  DO NOT smoke the morning of your procedure.  Please read over the following fact sheets that you were given. Anesthesia Post-op Instructions and Care and Recovery After Surgery       Upper Endoscopy, Adult, Care After This sheet gives you information about how to care for yourself after your procedure. Your health care provider may also give you more specific instructions. If you have problems or questions, contact your health care provider. What can I expect after the procedure? After the procedure, it is common to have:  A sore throat.  Mild stomach pain or discomfort.  Bloating.  Nausea. Follow these instructions at home:   Follow instructions from your health care provider about what to eat or drink after your  procedure.  Return to your normal activities as told by your health care provider. Ask your health care provider what activities are safe for you.  Take over-the-counter and prescription medicines only as told by your health care provider.  Do not drive for 24 hours if you were given a sedative during your procedure.  Keep all follow-up visits as told by your health care provider. This is important. Contact a health care provider if you have:  A sore throat that lasts longer than one day.  Trouble swallowing. Get help right away if:  You vomit blood or your vomit looks like coffee grounds.  You have: ? A fever. ? Bloody, black, or tarry stools. ? A severe sore throat or you cannot swallow. ? Difficulty breathing. ? Severe pain in your chest or abdomen. Summary  After the procedure, it is common to have a sore throat, mild stomach discomfort, bloating, and nausea.  Do not drive for 24 hours if you were given a sedative during the procedure.  Follow instructions from your health care provider about what to eat or drink after your procedure.  Return to your normal activities as told by your health care provider. This information is not intended to replace advice given to you by your health care provider. Make sure  you discuss any questions you have with your health care provider. Document Revised: 09/29/2017 Document Reviewed: 09/07/2017 Elsevier Patient Education  [place].  Esophageal Dilatation Esophageal dilatation, also called esophageal dilation, is a procedure to widen or open (dilate) a blocked or narrowed part of the esophagus. The esophagus is the part of the body that moves food and liquid from the mouth to the stomach. You may need this procedure if:  You have a buildup of scar tissue in your esophagus that makes it difficult, painful, or impossible to swallow. This can be caused by gastroesophageal reflux disease (GERD).  You have cancer of the  esophagus.  There is a problem with how food moves through your esophagus. In some cases, you may need this procedure repeated at a later time to dilate the esophagus gradually. Tell a health care provider about:  Any allergies you have.  All medicines you are taking, including vitamins, herbs, eye drops, creams, and over-the-counter medicines.  Any problems you or family members have had with anesthetic medicines.  Any blood disorders you have.  Any surgeries you have had.  Any medical conditions you have.  Any antibiotic medicines you are required to take before dental procedures.  Whether you are pregnant or may be pregnant. What are the risks? Generally, this is a safe procedure. However, problems may occur, including:  Bleeding due to a tear in the lining of the esophagus.  A hole (perforation) in the esophagus. What happens before the procedure?  Follow instructions from your health care provider about eating or drinking restrictions.  Ask your health care provider about changing or stopping your regular medicines. This is especially important if you are taking diabetes medicines or blood thinners.  Plan to have someone take you home from the hospital or clinic.  Plan to have a responsible adult care for you for at least 24 hours after you leave the hospital or clinic. This is important. What happens during the procedure?  You may be given a medicine to help you relax (sedative).  A numbing medicine may be sprayed into the back of your throat, or you may gargle the medicine.  Your health care provider may perform the dilatation using various surgical instruments, such as: ? Simple dilators. This instrument is carefully placed in the esophagus to stretch it. ? Guided wire bougies. This involves using an endoscope to insert a wire into the esophagus. A dilator is passed over this wire to enlarge the esophagus. Then the wire is removed. ? Balloon dilators. An endoscope  with a small balloon at the end is inserted into the esophagus. The balloon is inflated to stretch the esophagus and open it up. The procedure may vary among health care providers and hospitals. What happens after the procedure?  Your blood pressure, heart rate, breathing rate, and blood oxygen level will be monitored until the medicines you were given have worn off.  Your throat may feel slightly sore and numb. This will improve slowly over time.  You will not be allowed to eat or drink until your throat is no longer numb.  When you are able to drink, urinate, and sit on the edge of the bed without nausea or dizziness, you may be able to return home. Follow these instructions at home:  Take over-the-counter and prescription medicines only as told by your health care provider.  Do not drive for 24 hours if you were given a sedative during your procedure.  You should have a responsible  adult with you for 24 hours after the procedure.  Follow instructions from your health care provider about any eating or drinking restrictions.  Do not use any products that contain nicotine or tobacco, such as cigarettes and e-cigarettes. If you need help quitting, ask your health care provider.  Keep all follow-up visits as told by your health care provider. This is important. Get help right away if you:  Have a fever.  Have chest pain.  Have pain that is not relieved by medication.  Have trouble breathing.  Have trouble swallowing.  Vomit blood. Summary  Esophageal dilatation, also called esophageal dilation, is a procedure to widen or open (dilate) a blocked or narrowed part of the esophagus.  Plan to have someone take you home from the hospital or clinic.  For this procedure, a numbing medicine may be sprayed into the back of your throat, or you may gargle the medicine.  Do not drive for 24 hours if you were given a sedative during your procedure. This information is not intended to  replace advice given to you by your health care provider. Make sure you discuss any questions you have with your health care provider. Document Revised: 02/02/2019 Document Reviewed: 02/10/2017 Elsevier Patient Education  [place].  Colonoscopy, Adult, Care After This sheet gives you information about how to care for yourself after your procedure. Your health care provider may also give you more specific instructions. If you have problems or questions, contact your health care provider. What can I expect after the procedure? After the procedure, it is common to have:  A small amount of blood in your stool for 24 hours after the procedure.  Some gas.  Mild cramping or bloating of your abdomen. Follow these instructions at home: Eating and drinking   Drink enough fluid to keep your urine pale yellow.  Follow instructions from your health care provider about eating or drinking restrictions.  Resume your normal diet as instructed by your health care provider. Avoid heavy or fried foods that are hard to digest. Activity  Rest as told by your health care provider.  Avoid sitting for a long time without moving. Get up to take short walks every 1-2 hours. This is important to improve blood flow and breathing. Ask for help if you feel weak or unsteady.  Return to your normal activities as told by your health care provider. Ask your health care provider what activities are safe for you. Managing cramping and bloating   Try walking around when you have cramps or feel bloated.  Apply heat to your abdomen as told by your health care provider. Use the heat source that your health care provider recommends, such as a moist heat pack or a heating pad. ? Place a towel between your skin and the heat source. ? Leave the heat on for 20-30 minutes. ? Remove the heat if your skin turns bright red. This is especially important if you are unable to feel pain, heat, or cold. You may have a  greater risk of getting burned. General instructions  For the first 24 hours after the procedure: ? Do not drive or use machinery. ? Do not sign important documents. ? Do not drink alcohol. ? Do your regular daily activities at a slower pace than normal. ? Eat soft foods that are easy to digest.  Take over-the-counter and prescription medicines only as told by your health care provider.  Keep all follow-up visits as told by your health care provider.  This is important. Contact a health care provider if:  You have blood in your stool 2-3 days after the procedure. Get help right away if you have:  More than a small spotting of blood in your stool.  Large blood clots in your stool.  Swelling of your abdomen.  Nausea or vomiting.  A fever.  Increasing pain in your abdomen that is not relieved with medicine. Summary  After the procedure, it is common to have a small amount of blood in your stool. You may also have mild cramping and bloating of your abdomen.  For the first 24 hours after the procedure, do not drive or use machinery, sign important documents, or drink alcohol.  Get help right away if you have a lot of blood in your stool, nausea or vomiting, a fever, or increased pain in your abdomen. This information is not intended to replace advice given to you by your health care provider. Make sure you discuss any questions you have with your health care provider. Document Revised: 11/01/2018 Document Reviewed: 11/01/2018 Elsevier Patient Education  [place] After These instructions provide you with information about caring for yourself after your procedure. Your health care provider may also give you more specific instructions. Your treatment has been planned according to current medical practices, but problems sometimes occur. Call your health care provider if you have any problems or questions after your procedure. What can I expect  after the procedure? After your procedure, you may:  Feel sleepy for several hours.  Feel clumsy and have poor balance for several hours.  Feel forgetful about what happened after the procedure.  Have poor judgment for several hours.  Feel nauseous or vomit.  Have a sore throat if you had a breathing tube during the procedure. Follow these instructions at home: For at least 24 hours after the procedure:      Have a responsible adult stay with you. It is important to have someone help care for you until you are awake and alert.  Rest as needed.  Do not: ? Participate in activities in which you could fall or become injured. ? Drive. ? Use heavy machinery. ? Drink alcohol. ? Take sleeping pills or medicines that cause drowsiness. ? Make important decisions or sign legal documents. ? Take care of children on your own. Eating and drinking  Follow the diet that is recommended by your health care provider.  If you vomit, drink water, juice, or soup when you can drink without vomiting.  Make sure you have little or no nausea before eating solid foods. General instructions  Take over-the-counter and prescription medicines only as told by your health care provider.  If you have sleep apnea, surgery and certain medicines can increase your risk for breathing problems. Follow instructions from your health care provider about wearing your sleep device: ? Anytime you are sleeping, including during daytime naps. ? While taking prescription pain medicines, sleeping medicines, or medicines that make you drowsy.  If you smoke, do not smoke without supervision.  Keep all follow-up visits as told by your health care provider. This is important. Contact a health care provider if:  You keep feeling nauseous or you keep vomiting.  You feel light-headed.  You develop a rash.  You have a fever. Get help right away if:  You have trouble breathing. Summary  For several hours after  your procedure, you may feel sleepy and have poor judgment.  Have a responsible adult  stay with you for at least 24 hours or until you are awake and alert. This information is not intended to replace advice given to you by your health care provider. Make sure you discuss any questions you have with your health care provider. Document Revised: 07/06/2017 Document Reviewed: 07/29/2015 Elsevier Patient Education  [place].

## 2019-08-12 ENCOUNTER — Other Ambulatory Visit (HOSPITAL_COMMUNITY): Payer: Medicare Other

## 2019-08-12 ENCOUNTER — Other Ambulatory Visit (HOSPITAL_COMMUNITY)
Admission: RE | Admit: 2019-08-12 | Discharge: 2019-08-12 | Disposition: A | Discharge status: Home / Self Care | Payer: Medicare Other | Source: Ambulatory Visit | Attending: Internal Medicine | Admitting: Internal Medicine

## 2019-08-12 ENCOUNTER — Other Ambulatory Visit (HOSPITAL_COMMUNITY): Admission: RE | Admit: 2019-08-12 | Payer: Medicare Other | Source: Ambulatory Visit

## 2019-08-12 ENCOUNTER — Encounter (HOSPITAL_COMMUNITY): Payer: Self-pay

## 2019-08-12 ENCOUNTER — Encounter (HOSPITAL_COMMUNITY)
Admission: RE | Admit: 2019-08-12 | Discharge: 2019-08-12 | Disposition: A | Discharge status: Home / Self Care | Payer: Medicare Other | Source: Ambulatory Visit | Attending: Internal Medicine | Admitting: Internal Medicine

## 2019-08-12 ENCOUNTER — Ambulatory Visit (HOSPITAL_COMMUNITY)
Admission: RE | Admit: 2019-08-12 | Discharge: 2019-08-12 | Disposition: A | Discharge status: Home / Self Care | Payer: Medicare Other | Attending: Internal Medicine | Admitting: Internal Medicine

## 2019-08-12 ENCOUNTER — Other Ambulatory Visit: Payer: Self-pay

## 2019-08-12 DIAGNOSIS — Z01812 Encounter for preprocedural laboratory examination: Secondary | ICD-10-CM | POA: Diagnosis not present

## 2019-08-12 DIAGNOSIS — Z20822 Contact with and (suspected) exposure to covid-19: Secondary | ICD-10-CM | POA: Diagnosis not present

## 2019-08-12 HISTORY — DX: Personal history of urinary calculi: Z87.442

## 2019-08-12 LAB — CBC WITH DIFFERENTIAL/PLATELET
Abs Immature Granulocytes: 0.02 10*3/uL (ref 0.00–0.07)
Basophils Absolute: 0 10*3/uL (ref 0.0–0.1)
Basophils Relative: 1 %
Eosinophils Absolute: 0.1 10*3/uL (ref 0.0–0.5)
Eosinophils Relative: 2 %
HCT: 42.1 % (ref 36.0–46.0)
Hemoglobin: 13.2 g/dL (ref 12.0–15.0)
Immature Granulocytes: 1 %
Lymphocytes Relative: 25 %
Lymphs Abs: 1.1 10*3/uL (ref 0.7–4.0)
MCH: 26.8 pg (ref 26.0–34.0)
MCHC: 31.4 g/dL (ref 30.0–36.0)
MCV: 85.4 fL (ref 80.0–100.0)
Monocytes Absolute: 0.4 10*3/uL (ref 0.1–1.0)
Monocytes Relative: 8 %
Neutro Abs: 2.7 10*3/uL (ref 1.7–7.7)
Neutrophils Relative %: 63 %
Platelets: 131 10*3/uL — ABNORMAL LOW (ref 150–400)
RBC: 4.93 MIL/uL (ref 3.87–5.11)
RDW: 13.3 % (ref 11.5–15.5)
WBC: 4.3 10*3/uL (ref 4.0–10.5)
nRBC: 0 % (ref 0.0–0.2)

## 2019-08-13 LAB — SARS CORONAVIRUS 2 (TAT 6-24 HRS): SARS Coronavirus 2: NEGATIVE

## 2019-08-15 ENCOUNTER — Ambulatory Visit (HOSPITAL_COMMUNITY): Payer: Medicare Other | Admitting: Anesthesiology

## 2019-08-15 ENCOUNTER — Ambulatory Visit (HOSPITAL_COMMUNITY)
Admission: RE | Admit: 2019-08-15 | Discharge: 2019-08-15 | Disposition: A | Discharge status: Home / Self Care | Payer: Medicare Other | Attending: Internal Medicine | Admitting: Internal Medicine

## 2019-08-15 ENCOUNTER — Encounter (HOSPITAL_COMMUNITY): Payer: Self-pay | Admitting: Internal Medicine

## 2019-08-15 ENCOUNTER — Encounter (HOSPITAL_COMMUNITY)
Admission: RE | Disposition: A | Discharge status: Home / Self Care | Payer: Self-pay | Source: Home / Self Care | Attending: Internal Medicine

## 2019-08-15 ENCOUNTER — Encounter: Payer: Self-pay | Admitting: Internal Medicine

## 2019-08-15 DIAGNOSIS — G4730 Sleep apnea, unspecified: Secondary | ICD-10-CM | POA: Insufficient documentation

## 2019-08-15 DIAGNOSIS — K760 Fatty (change of) liver, not elsewhere classified: Secondary | ICD-10-CM | POA: Diagnosis not present

## 2019-08-15 DIAGNOSIS — K2100 Gastro-esophageal reflux disease with esophagitis, without bleeding: Secondary | ICD-10-CM | POA: Insufficient documentation

## 2019-08-15 DIAGNOSIS — R1310 Dysphagia, unspecified: Secondary | ICD-10-CM | POA: Diagnosis not present

## 2019-08-15 DIAGNOSIS — F419 Anxiety disorder, unspecified: Secondary | ICD-10-CM | POA: Insufficient documentation

## 2019-08-15 DIAGNOSIS — Z86010 Personal history of colonic polyps: Secondary | ICD-10-CM | POA: Diagnosis not present

## 2019-08-15 DIAGNOSIS — K449 Diaphragmatic hernia without obstruction or gangrene: Secondary | ICD-10-CM | POA: Diagnosis not present

## 2019-08-15 DIAGNOSIS — D122 Benign neoplasm of ascending colon: Secondary | ICD-10-CM | POA: Insufficient documentation

## 2019-08-15 DIAGNOSIS — J439 Emphysema, unspecified: Secondary | ICD-10-CM | POA: Insufficient documentation

## 2019-08-15 DIAGNOSIS — M1990 Unspecified osteoarthritis, unspecified site: Secondary | ICD-10-CM | POA: Diagnosis not present

## 2019-08-15 DIAGNOSIS — Z79899 Other long term (current) drug therapy: Secondary | ICD-10-CM | POA: Diagnosis not present

## 2019-08-15 DIAGNOSIS — F17210 Nicotine dependence, cigarettes, uncomplicated: Secondary | ICD-10-CM | POA: Diagnosis not present

## 2019-08-15 DIAGNOSIS — K222 Esophageal obstruction: Secondary | ICD-10-CM | POA: Insufficient documentation

## 2019-08-15 DIAGNOSIS — K635 Polyp of colon: Secondary | ICD-10-CM

## 2019-08-15 DIAGNOSIS — Z1211 Encounter for screening for malignant neoplasm of colon: Secondary | ICD-10-CM | POA: Diagnosis not present

## 2019-08-15 HISTORY — PX: POLYPECTOMY: SHX5525

## 2019-08-15 HISTORY — PX: COLONOSCOPY WITH PROPOFOL: SHX5780

## 2019-08-15 HISTORY — PX: ESOPHAGOGASTRODUODENOSCOPY (EGD) WITH PROPOFOL: SHX5813

## 2019-08-15 HISTORY — PX: MALONEY DILATION: SHX5535

## 2019-08-15 SURGERY — COLONOSCOPY WITH PROPOFOL
Anesthesia: Monitor Anesthesia Care

## 2019-08-15 SURGERY — ESOPHAGOGASTRODUODENOSCOPY (EGD) WITH PROPOFOL
Anesthesia: Monitor Anesthesia Care

## 2019-08-15 SURGERY — COLONOSCOPY WITH PROPOFOL
Anesthesia: General

## 2019-08-15 MED ORDER — LACTATED RINGERS IV SOLN
Freq: Once | INTRAVENOUS | Status: AC
  Administered 2019-08-15: 1000 mL via INTRAVENOUS

## 2019-08-15 MED ORDER — LIDOCAINE VISCOUS HCL 2 % MT SOLN
OROMUCOSAL | Status: DC | PRN
  Administered 2019-08-15: 4 mL via OROMUCOSAL

## 2019-08-15 MED ORDER — GLYCOPYRROLATE 0.2 MG/ML IJ SOLN
INTRAMUSCULAR | Status: DC | PRN
Start: 2019-08-15 — End: 2019-08-15
  Administered 2019-08-15: .1 mg via INTRAVENOUS

## 2019-08-15 MED ORDER — ESMOLOL HCL 100 MG/10ML IV SOLN
INTRAVENOUS | Status: DC | PRN
  Administered 2019-08-15: 20 mg via INTRAVENOUS

## 2019-08-15 MED ORDER — PROPOFOL 10 MG/ML IV BOLUS
INTRAVENOUS | Status: AC
  Filled 2019-08-15: qty 40

## 2019-08-15 MED ORDER — PROPOFOL 500 MG/50ML IV EMUL
INTRAVENOUS | Status: DC | PRN
  Administered 2019-08-15: 150 ug/kg/min via INTRAVENOUS

## 2019-08-15 MED ORDER — LACTATED RINGERS IV SOLN: INTRAVENOUS | Status: DC | PRN

## 2019-08-15 MED ORDER — LIDOCAINE HCL (CARDIAC) PF 100 MG/5ML IV SOSY
PREFILLED_SYRINGE | INTRAVENOUS | Status: DC | PRN
  Administered 2019-08-15: 50 mg via INTRATRACHEAL

## 2019-08-15 MED ORDER — IPRATROPIUM-ALBUTEROL 0.5-2.5 (3) MG/3ML IN SOLN
RESPIRATORY_TRACT | Status: AC
  Filled 2019-08-15: qty 3

## 2019-08-15 MED ORDER — PROPOFOL 10 MG/ML IV BOLUS
INTRAVENOUS | Status: DC | PRN
  Administered 2019-08-15: 20 mg via INTRAVENOUS

## 2019-08-15 MED ORDER — KETAMINE HCL 50 MG/5ML IJ SOSY
PREFILLED_SYRINGE | INTRAMUSCULAR | Status: AC
  Filled 2019-08-15: qty 5

## 2019-08-15 MED ORDER — LIDOCAINE VISCOUS HCL 2 % MT SOLN
OROMUCOSAL | Status: AC
  Filled 2019-08-15: qty 15

## 2019-08-15 MED ORDER — IPRATROPIUM-ALBUTEROL 0.5-2.5 (3) MG/3ML IN SOLN
3.0000 mL | RESPIRATORY_TRACT | Status: DC
  Administered 2019-08-15: 3 mL via RESPIRATORY_TRACT

## 2019-08-15 MED ORDER — KETAMINE HCL 10 MG/ML IJ SOLN
INTRAMUSCULAR | Status: DC | PRN
  Administered 2019-08-15: 20 mg via INTRAVENOUS

## 2019-08-15 NOTE — Op Note (Signed)
[facility] Patient Name: Destiny Villanueva Procedure Date: 08/15/2019 7:56 AM MRN: [id number] Date of Birth: Sep 22, 1960 Attending MD: Norvel Richards , MD CSN: [id number] Age: 59 Admit Type: Outpatient Procedure:                Colonoscopy Indications:              High risk colon cancer surveillance: Personal                            history of colonic polyps Providers:                Norvel Richards, MD, Gwenlyn Fudge RN, RN,                            Randa Spike, Technician Referring MD:              Medicines:                Propofol per Anesthesia Complications:            No immediate complications. Estimated Blood Loss:     Estimated blood loss was minimal. Procedure:                Pre-Anesthesia Assessment:                           - Prior to the procedure, a History and Physical                            was performed, and patient medications and                            allergies were reviewed. The patient's tolerance of                            previous anesthesia was also reviewed. The risks                            and benefits of the procedure and the sedation                            options and risks were discussed with the patient.                            All questions were answered, and informed consent                            was obtained. Prior Anticoagulants: The patient has                            taken no previous anticoagulant or antiplatelet                            agents. ASA Grade Assessment: II - A patient with  mild systemic disease. After reviewing the risks                            and benefits, the patient was deemed in                            satisfactory condition to undergo the procedure.                           After obtaining informed consent, the colonoscope                            was passed under direct vision. Throughout the                            procedure, the  patient's blood pressure, pulse, and                            oxygen saturations were monitored continuously. The                            [id number] scope was introduced through                            the anus and advanced to the the cecum, identified                            by appendiceal orifice and ileocecal valve. The                            colonoscopy was performed without difficulty. The                            patient tolerated the procedure well. The quality                            of the bowel preparation was adequate. Scope In: 8:00:22 AM Scope Out: 8:15:43 AM Scope Withdrawal Time: 0 hours 8 minutes 54 seconds  Total Procedure Duration: 0 hours 15 minutes 21 seconds  Findings:      The perianal and digital rectal examinations were normal.      Four sessile polyps were found in the ascending colon. The polyps were 3       to 6 mm in size. These polyps were removed with a cold snare. Resection       and retrieval were complete. Estimated blood loss was minimal.      The exam was otherwise without abnormality on direct and retroflexion       views. Impression:               - Four 3 to 6 mm polyps in the ascending colon,                            removed with a cold snare. Resected and retrieved.                           -  The examination was otherwise normal on direct                            and retroflexion views. Moderate Sedation:      Moderate (conscious) sedation was personally administered by an       anesthesia professional. The following parameters were monitored: oxygen       saturation, heart rate, blood pressure, respiratory rate, EKG, adequacy       of pulmonary ventilation, and response to care. Recommendation:           - Patient has a contact number available for                            emergencies. The signs and symptoms of potential                            delayed complications were discussed with the                             patient. Return to normal activities tomorrow.                            Written discharge instructions were provided to the                            patient.                           - Resume previous diet.                           - Continue present medications.                           - Repeat colonoscopy date to be determined after                            pending pathology results are reviewed for                            surveillance based on pathology results.                           - Return to GI office in 6 months. See EGD report. Procedure Code(s):        --- Professional ---                           [phone], Colonoscopy, flexible; with removal of                            tumor(s), polyp(s), or other lesion(s) by snare                            technique Diagnosis Code(s):        --- Professional ---  Z86.010, Personal history of colonic polyps                           K63.5, Polyp of colon CPT copyright 2019 [employer]. All rights reserved. The codes documented in this report are preliminary and upon coder review may  be revised to meet current compliance requirements. Cristopher Estimable. Mahek Schlesinger, MD Norvel Richards, MD 08/15/2019 8:29:52 AM This report has been signed electronically. Number of Addenda: 0
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
## 2019-08-15 NOTE — Anesthesia Preprocedure Evaluation (Addendum)
Anesthesia Evaluation  Patient identified by MRN, date of birth, ID band Patient awake    Reviewed: Allergy & Precautions, NPO status , Patient's Chart, lab work & pertinent test results, reviewed documented beta blocker date and time   History of Anesthesia Complications Negative for: history of anesthetic complications  Airway Mallampati: II  TM Distance: >3 FB Neck ROM: Full    Dental  (+) Edentulous Upper, Edentulous Lower   Pulmonary sleep apnea and Continuous Positive Airway Pressure Ventilation , COPD,  COPD inhaler, Current Smoker and Patient abstained from smoking.,    Pulmonary exam normal breath sounds clear to auscultation       Cardiovascular METS: 3 - Mets Normal cardiovascular exam Rhythm:Regular Rate:Normal  1.  Atypical chest pain: No significant coronary artery disease by coronary CT angiography.  Occasional chest pains occur with anxiety and stress and never with exertion.  I will stop metoprolol.  2.  Hyperlipidemia: Continue statin.  3.  Tobacco use.   Neuro/Psych PSYCHIATRIC DISORDERS Anxiety Depression negative neurological ROS     GI/Hepatic Neg liver ROS, GERD  Medicated and Poorly Controlled,  Endo/Other  negative endocrine ROS  Renal/GU negative Renal ROS     Musculoskeletal  (+) Arthritis ,   Abdominal   Peds  Hematology  (+) Blood dyscrasia (thrombocytopenia), ,   Anesthesia Other Findings   Reproductive/Obstetrics                           Anesthesia Physical Anesthesia Plan  ASA: III  Anesthesia Plan: General   Post-op Pain Management:    Induction: Intravenous  PONV Risk Score and Plan: 1 and Treatment may vary due to age or medical condition and TIVA  Airway Management Planned: Nasal Cannula and Natural Airway  Additional Equipment:   Intra-op Plan:   Post-operative Plan:   Informed Consent: I have reviewed the patients History and  Physical, chart, labs and discussed the procedure including the risks, benefits and alternatives for the proposed anesthesia with the patient or authorized representative who has indicated his/her understanding and acceptance.       Plan Discussed with: CRNA  Anesthesia Plan Comments:         Anesthesia Quick Evaluation

## 2019-08-15 NOTE — Op Note (Signed)
[facility] Patient Name: Destiny Villanueva Procedure Date: 08/15/2019 7:17 AM MRN: [id number] Date of Birth: 1960-10-05 Attending MD: Norvel Richards , MD CSN: [id number] Age: 59 Admit Type: Outpatient Procedure:                Upper GI endoscopy Indications:              Dysphagia Providers:                Norvel Richards, MD, Jeanann Lewandowsky. Sharon Seller, RN,                            Randa Spike, Technician Referring MD:              Medicines:                Propofol per Anesthesia Complications:            No immediate complications. Estimated Blood Loss:     Estimated blood loss was minimal. Estimated blood                            loss was minimal. Procedure:                Pre-Anesthesia Assessment:                           - Prior to the procedure, a History and Physical                            was performed, and patient medications and                            allergies were reviewed. The patient's tolerance of                            previous anesthesia was also reviewed. The risks                            and benefits of the procedure and the sedation                            options and risks were discussed with the patient.                            All questions were answered, and informed consent                            was obtained. Prior Anticoagulants: The patient has                            taken no previous anticoagulant or antiplatelet                            agents. ASA Grade Assessment: II - A patient with  mild systemic disease. After reviewing the risks                            and benefits, the patient was deemed in                            satisfactory condition to undergo the procedure.                           After obtaining informed consent, the endoscope was                            passed under direct vision. Throughout the                            procedure, the patient's blood  pressure, pulse, and                            oxygen saturations were monitored continuously. The                            [id number]) scope was introduced through the                            mouth, and advanced to the second part of duodenum.                            The upper GI endoscopy was accomplished without                            difficulty. The patient tolerated the procedure                            well. Scope In: [id number] AM Scope Out: 7:53:43 AM Total Procedure Duration: 0 hours 7 minutes 4 seconds  Findings:      A mild Schatzki ring was found at the gastroesophageal junction. Tiny       focal area of erosion straddling the GE junction.      A small hiatal hernia was present.      No other significant abnormalities were identified in a careful       examination of the stomach.      The duodenal bulb and second portion of the duodenum were normal. The       scope was withdrawn. Dilation was performed with a Maloney dilator with       no resistance at 56 Fr. The scope was withdrawn. Dilation was performed       with a Maloney dilator with mild resistance at 72 Fr. The dilation site       was examined following endoscope reinsertion and showed no change.       Estimated blood loss: none. Subsequently, biopsy forceps used to take       four-quadrant bites of the ring to disrupted. This was done without       difficulty and with no complication. Minimal bleeding. Impression:               -  Mild erosive reflux esophagitis. Mild Schatzki                            ring. Dilated.                           - Small hiatal hernia.                           - Normal duodenal bulb and second portion of the                            duodenum.                           - No specimens collected. No teeth make mastication                            and symptom of dysphagia more challenging. Moderate Sedation:      Moderate (conscious) sedation was personally  administered by an       anesthesia professional. The following parameters were monitored: oxygen       saturation, heart rate, blood pressure, respiratory rate, EKG, adequacy       of pulmonary ventilation, and response to care. Recommendation:           - Patient has a contact number available for                            emergencies. The signs and symptoms of potential                            delayed complications were discussed with the                            patient. Return to normal activities tomorrow.                            Written discharge instructions were provided to the                            patient.                           - Resume previous diet.                           - Continue present medications. Continue                            rabeprazole 20 mg daily. Swallowing precautions                            reviewed.                           - Return to my office in 2 months. Procedure Code(s):        ---  Professional ---                           [phone], Esophagogastroduodenoscopy, flexible,                            transoral; diagnostic, including collection of                            specimen(s) by brushing or washing, when performed                            (separate procedure)                           43450, Dilation of esophagus, by unguided sound or                            bougie, single or multiple passes Diagnosis Code(s):        --- Professional ---                           K22.2, Esophageal obstruction                           K44.9, Diaphragmatic hernia without obstruction or                            gangrene                           R13.10, Dysphagia, unspecified CPT copyright 2019 [employer]. All rights reserved. The codes documented in this report are preliminary and upon coder review may  be revised to meet current compliance requirements. Cristopher Estimable. Jacquel Mccamish, MD Norvel Richards, MD 08/15/2019  8:27:27 AM This report has been signed electronically. Number of Addenda: 0

## 2019-08-15 NOTE — Discharge Instructions (Signed)
Gastroesophageal Reflux Disease, Adult Gastroesophageal reflux (GER) happens when acid from the stomach flows up into the tube that connects the mouth and the stomach (esophagus). Normally, food travels down the esophagus and stays in the stomach to be digested. With GER, food and stomach acid sometimes move back up into the esophagus. You may have a disease called gastroesophageal reflux disease (GERD) if the reflux:  Happens often.  Causes frequent or very bad symptoms.  Causes problems such as damage to the esophagus. When this happens, the esophagus becomes sore and swollen (inflamed). Over time, GERD can make small holes (ulcers) in the lining of the esophagus. What are the causes? This condition is caused by a problem with the muscle between the esophagus and the stomach. When this muscle is weak or not normal, it does not close properly to keep food and acid from coming back up from the stomach. The muscle can be weak because of:  Tobacco use.  Pregnancy.  Having a certain type of hernia (hiatal hernia).  Alcohol use.  Certain foods and drinks, such as coffee, chocolate, onions, and peppermint. What increases the risk? You are more likely to develop this condition if you:  Are overweight.  Have a disease that affects your connective tissue.  Use NSAID medicines. What are the signs or symptoms? Symptoms of this condition include:  Heartburn.  Difficult or painful swallowing.  The feeling of having a lump in the throat.  A bitter taste in the mouth.  Bad breath.  Having a lot of saliva.  Having an upset or bloated stomach.  Belching.  Chest pain. Different conditions can cause chest pain. Make sure you see your doctor if you have chest pain.  Shortness of breath or noisy breathing (wheezing).  Ongoing (chronic) cough or a cough at night.  Wearing away of the surface of teeth (tooth enamel).  Weight loss. How is this treated? Treatment will depend on how  bad your symptoms are. Your doctor may suggest:  Changes to your diet.  Medicine.  Surgery. Follow these instructions at home: Eating and drinking   Follow a diet as told by your doctor. You may need to avoid foods and drinks such as: ? Coffee and tea (with or without caffeine). ? Drinks that contain alcohol. ? Energy drinks and sports drinks. ? Bubbly (carbonated) drinks or sodas. ? Chocolate and cocoa. ? Peppermint and mint flavorings. ? Garlic and onions. ? Horseradish. ? Spicy and acidic foods. These include peppers, chili powder, curry powder, vinegar, hot sauces, and BBQ sauce. ? Citrus fruit juices and citrus fruits, such as oranges, lemons, and limes. ? Tomato-based foods. These include red sauce, chili, salsa, and pizza with red sauce. ? Fried and fatty foods. These include donuts, french fries, potato chips, and high-fat dressings. ? High-fat meats. These include hot dogs, rib eye steak, sausage, ham, and bacon. ? High-fat dairy items, such as whole milk, butter, and cream cheese.  Eat small meals often. Avoid eating large meals.  Avoid drinking large amounts of liquid with your meals.  Avoid eating meals during the 2-3 hours before bedtime.  Avoid lying down right after you eat.  Do not exercise right after you eat. Lifestyle   Do not use any products that contain nicotine or tobacco. These include cigarettes, e-cigarettes, and chewing tobacco. If you need help quitting, ask your doctor.  Try to lower your stress. If you need help doing this, ask your doctor.  If you are overweight, lose an amount   of weight that is healthy for you. Ask your doctor about a safe weight loss goal. General instructions  Pay attention to any changes in your symptoms.  Take over-the-counter and prescription medicines only as told by your doctor. Do not take aspirin, ibuprofen, or other NSAIDs unless your doctor says it is okay.  Wear loose clothes. Do not wear anything tight  around your waist.  Raise (elevate) the head of your bed about 6 inches (15 cm).  Avoid bending over if this makes your symptoms worse.  Keep all follow-up visits as told by your doctor. This is important. Contact a doctor if:  You have new symptoms.  You lose weight and you do not know why.  You have trouble swallowing or it hurts to swallow.  You have wheezing or a cough that keeps happening.  Your symptoms do not get better with treatment.  You have a hoarse voice. Get help right away if:  You have pain in your arms, neck, jaw, teeth, or back.  You feel sweaty, dizzy, or light-headed.  You have chest pain or shortness of breath.  You throw up (vomit) and your throw-up looks like blood or coffee grounds.  You pass out (faint).  Your poop (stool) is bloody or black.  You cannot swallow, drink, or eat. Summary  If a person has gastroesophageal reflux disease (GERD), food and stomach acid move back up into the esophagus and cause symptoms or problems such as damage to the esophagus.  Treatment will depend on how bad your symptoms are.  Follow a diet as told by your doctor.  Take all medicines only as told by your doctor. This information is not intended to replace advice given to you by your health care provider. Make sure you discuss any questions you have with your health care provider. Document Revised: 10/14/2017 Document Reviewed: 10/14/2017 Elsevier Patient Education  [place].  Colonoscopy Discharge Instructions  Read the instructions outlined below and refer to this sheet in the next few weeks. These discharge instructions provide you with general information on caring for yourself after you leave the hospital. Your doctor may also give you specific instructions. While your treatment has been planned according to the most current medical practices available, unavoidable complications occasionally occur. If you have any problems or questions after  discharge, call Dr. Gala Romney at [phone]. ACTIVITY  You may resume your regular activity, but move at a slower pace for the next 24 hours.   Take frequent rest periods for the next 24 hours.   Walking will help get rid of the air and reduce the bloated feeling in your belly (abdomen).   No driving for 24 hours (because of the medicine (anesthesia) used during the test).    Do not sign any important legal documents or operate any machinery for 24 hours (because of the anesthesia used during the test).  NUTRITION  Drink plenty of fluids.   You may resume your normal diet as instructed by your doctor.   Begin with a light meal and progress to your normal diet. Heavy or fried foods are harder to digest and may make you feel sick to your stomach (nauseated).   Avoid alcoholic beverages for 24 hours or as instructed.  MEDICATIONS  You may resume your normal medications unless your doctor tells you otherwise.  WHAT YOU CAN EXPECT TODAY  Some feelings of bloating in the abdomen.   Passage of more gas than usual.   Spotting of blood in your  stool or on the toilet paper.  IF YOU HAD POLYPS REMOVED DURING THE COLONOSCOPY:  No aspirin products for 7 days or as instructed.   No alcohol for 7 days or as instructed.   Eat a soft diet for the next 24 hours.  FINDING OUT THE RESULTS OF YOUR TEST Not all test results are available during your visit. If your test results are not back during the visit, make an appointment with your caregiver to find out the results. Do not assume everything is normal if you have not heard from your caregiver or the medical facility. It is important for you to follow up on all of your test results.  SEEK IMMEDIATE MEDICAL ATTENTION IF:  You have more than a spotting of blood in your stool.   Your belly is swollen (abdominal distention).   You are nauseated or vomiting.   You have a temperature over 101.   You have abdominal pain or discomfort that is severe  or gets worse throughout the day.    EGD Discharge instructions Please read the instructions outlined below and refer to this sheet in the next few weeks. These discharge instructions provide you with general information on caring for yourself after you leave the hospital. Your doctor may also give you specific instructions. While your treatment has been planned according to the most current medical practices available, unavoidable complications occasionally occur. If you have any problems or questions after discharge, please call your doctor. ACTIVITY  You may resume your regular activity but move at a slower pace for the next 24 hours.   Take frequent rest periods for the next 24 hours.   Walking will help expel (get rid of) the air and reduce the bloated feeling in your abdomen.   No driving for 24 hours (because of the anesthesia (medicine) used during the test).   You may shower.   Do not sign any important legal documents or operate any machinery for 24 hours (because of the anesthesia used during the test).  NUTRITION  Drink plenty of fluids.   You may resume your normal diet.   Begin with a light meal and progress to your normal diet.   Avoid alcoholic beverages for 24 hours or as instructed by your caregiver.  MEDICATIONS  You may resume your normal medications unless your caregiver tells you otherwise.  WHAT YOU CAN EXPECT TODAY  You may experience abdominal discomfort such as a feeling of fullness or "gas" pains.  FOLLOW-UP  Your doctor will discuss the results of your test with you.  SEEK IMMEDIATE MEDICAL ATTENTION IF ANY OF THE FOLLOWING OCCUR:  Excessive nausea (feeling sick to your stomach) and/or vomiting.   Severe abdominal pain and distention (swelling).   Trouble swallowing.   Temperature over 101 F (37.8 C).   Rectal bleeding or vomiting of blood.    Colon Polyps  Polyps are tissue growths inside the body. Polyps can grow in many places,  including the large intestine (colon). A polyp may be a round bump or a mushroom-shaped growth. You could have one polyp or several. Most colon polyps are noncancerous (benign). However, some colon polyps can become cancerous over time. Finding and removing the polyps early can help prevent this. What are the causes? The exact cause of colon polyps is not known. What increases the risk? You are more likely to develop this condition if you:  Have a family history of colon cancer or colon polyps.  Are older than 50 or  older than 45 if you are African American.  Have inflammatory bowel disease, such as ulcerative colitis or Crohn's disease.  Have certain hereditary conditions, such as: ? Familial adenomatous polyposis. ? Lynch syndrome. ? Turcot syndrome. ? Peutz-Jeghers syndrome.  Are overweight.  Smoke cigarettes.  Do not get enough exercise.  Drink too much alcohol.  Eat a diet that is high in fat and red meat and low in fiber.  Had childhood cancer that was treated with abdominal radiation. What are the signs or symptoms? Most polyps do not cause symptoms. If you have symptoms, they may include:  Blood coming from your rectum when having a bowel movement.  Blood in your stool. The stool may look dark red or black.  Abdominal pain.  A change in bowel habits, such as constipation or diarrhea. How is this diagnosed? This condition is diagnosed with a colonoscopy. This is a procedure in which a lighted, flexible scope is inserted into the anus and then passed into the colon to examine the area. Polyps are sometimes found when a colonoscopy is done as part of routine cancer screening tests. How is this treated? Treatment for this condition involves removing any polyps that are found. Most polyps can be removed during a colonoscopy. Those polyps will then be tested for cancer. Additional treatment may be needed depending on the results of testing. Follow these instructions at  home: Lifestyle  Maintain a healthy weight, or lose weight if recommended by your health care provider.  Exercise every day or as told by your health care provider.  Do not use any products that contain nicotine or tobacco, such as cigarettes and e-cigarettes. If you need help quitting, ask your health care provider.  If you drink alcohol, limit how much you have: ? 0-1 drink a day for women. ? 0-2 drinks a day for men.  Be aware of how much alcohol is in your drink. In the [place] one drink equals one 12 oz bottle of beer (355 mL), one 5 oz glass of wine (148 mL), or one 1 oz shot of hard liquor (44 mL). Eating and drinking   Eat foods that are high in fiber, such as fruits, vegetables, and whole grains.  Eat foods that are high in calcium and vitamin D, such as milk, cheese, yogurt, eggs, liver, fish, and broccoli.  Limit foods that are high in fat, such as fried foods and desserts.  Limit the amount of red meat and processed meat you eat, such as hot dogs, sausage, bacon, and lunch meats. General instructions  Keep all follow-up visits as told by your health care provider. This is important. ? This includes having regularly scheduled colonoscopies. ? Talk to your health care provider about when you need a colonoscopy. Contact a health care provider if:  You have new or worsening bleeding during a bowel movement.  You have new or increased blood in your stool.  You have a change in bowel habits.  You lose weight for no known reason. Summary  Polyps are tissue growths inside the body. Polyps can grow in many places, including the colon.  Most colon polyps are noncancerous (benign), but some can become cancerous over time.  This condition is diagnosed with a colonoscopy.  Treatment for this condition involves removing any polyps that are found. Most polyps can be removed during a colonoscopy. This information is not intended to replace advice given to you by your health  care provider. Make sure you discuss any questions you  have with your health care provider. Document Revised: 07/23/2017 Document Reviewed: 07/23/2017 Elsevier Patient Education  [place].    GERD information provided  Colon polyp information provided  Continue rabeprazole 20 mg daily  Further recommendations to follow pending review of pathology report  Office visit with [place] in 6 months  At patient request, I called Mr. [place] Cellar at [phone] and reviewed results

## 2019-08-15 NOTE — Anesthesia Postprocedure Evaluation (Signed)
Anesthesia Post Note  Patient: Destiny Villanueva  Procedure(s) Performed: COLONOSCOPY WITH PROPOFOL (N/A ) ESOPHAGOGASTRODUODENOSCOPY (EGD) WITH PROPOFOL (N/A ) MALONEY DILATION (N/A ) POLYPECTOMY  Anesthesia Type: General Level of consciousness: awake and alert Pain management: pain level controlled Vital Signs Assessment: post-procedure vital signs reviewed and stable Respiratory status: spontaneous breathing Cardiovascular status: stable Postop Assessment: no apparent nausea or vomiting Anesthetic complications: no     Last Vitals:  Vitals:   08/15/19 0821 08/15/19 0823  BP: 100/76   Pulse: (!) 119   Resp: 16   Temp: 36.7 C   SpO2: (!) 88% 93%    Last Pain:  Vitals:   08/15/19 0821  TempSrc:   PainSc: 0-No pain                 Yohana Bartha Hristova

## 2019-08-15 NOTE — Transfer of Care (Signed)
Immediate Anesthesia Transfer of Care Note  Patient: Johniece Fluke Moe  Procedure(s) Performed: COLONOSCOPY WITH PROPOFOL (N/A ) ESOPHAGOGASTRODUODENOSCOPY (EGD) WITH PROPOFOL (N/A ) MALONEY DILATION (N/A ) POLYPECTOMY  Patient Location: PACU  Anesthesia Type:General  Level of Consciousness: awake and alert   Airway & Oxygen Therapy: Patient Spontanous Breathing and Patient connected to nasal cannula oxygen  Post-op Assessment: Report given to RN and Post -op Vital signs reviewed and stable  Post vital signs: Reviewed and stable  Last Vitals:  Vitals Value Taken Time  BP 100/76 08/15/19 0823  Temp 36.7 C 08/15/19 0821  Pulse 117 08/15/19 0828  Resp 17 08/15/19 0828  SpO2 97 % 08/15/19 0828  Vitals shown include unvalidated device data.  Last Pain:  Vitals:   08/15/19 0821  TempSrc:   PainSc: 0-No pain      Patients Stated Pain Goal: 6 ([id number])  Complications: No apparent anesthesia complications

## 2019-08-16 ENCOUNTER — Encounter: Payer: Self-pay | Admitting: Internal Medicine

## 2019-08-16 DIAGNOSIS — J301 Allergic rhinitis due to pollen: Secondary | ICD-10-CM | POA: Diagnosis not present

## 2019-08-16 LAB — SURGICAL PATHOLOGY

## 2019-08-18 DIAGNOSIS — J301 Allergic rhinitis due to pollen: Secondary | ICD-10-CM | POA: Diagnosis not present

## 2019-08-23 DIAGNOSIS — J301 Allergic rhinitis due to pollen: Secondary | ICD-10-CM | POA: Diagnosis not present

## 2019-08-25 DIAGNOSIS — J301 Allergic rhinitis due to pollen: Secondary | ICD-10-CM | POA: Diagnosis not present

## 2019-08-30 DIAGNOSIS — J301 Allergic rhinitis due to pollen: Secondary | ICD-10-CM | POA: Diagnosis not present

## 2019-09-01 DIAGNOSIS — J301 Allergic rhinitis due to pollen: Secondary | ICD-10-CM | POA: Diagnosis not present

## 2019-09-06 DIAGNOSIS — J301 Allergic rhinitis due to pollen: Secondary | ICD-10-CM | POA: Diagnosis not present

## 2019-09-08 DIAGNOSIS — J301 Allergic rhinitis due to pollen: Secondary | ICD-10-CM | POA: Diagnosis not present

## 2019-09-13 DIAGNOSIS — J301 Allergic rhinitis due to pollen: Secondary | ICD-10-CM | POA: Diagnosis not present

## 2019-09-15 DIAGNOSIS — J301 Allergic rhinitis due to pollen: Secondary | ICD-10-CM | POA: Diagnosis not present

## 2019-09-20 DIAGNOSIS — J301 Allergic rhinitis due to pollen: Secondary | ICD-10-CM | POA: Diagnosis not present

## 2019-09-22 DIAGNOSIS — J301 Allergic rhinitis due to pollen: Secondary | ICD-10-CM | POA: Diagnosis not present

## 2019-09-27 DIAGNOSIS — I2510 Atherosclerotic heart disease of native coronary artery without angina pectoris: Secondary | ICD-10-CM | POA: Diagnosis not present

## 2019-09-27 DIAGNOSIS — J449 Chronic obstructive pulmonary disease, unspecified: Secondary | ICD-10-CM | POA: Diagnosis not present

## 2019-09-27 DIAGNOSIS — F17210 Nicotine dependence, cigarettes, uncomplicated: Secondary | ICD-10-CM | POA: Diagnosis not present

## 2019-09-27 DIAGNOSIS — J301 Allergic rhinitis due to pollen: Secondary | ICD-10-CM | POA: Diagnosis not present

## 2019-09-27 DIAGNOSIS — K7460 Unspecified cirrhosis of liver: Secondary | ICD-10-CM | POA: Diagnosis not present

## 2019-09-27 DIAGNOSIS — Z299 Encounter for prophylactic measures, unspecified: Secondary | ICD-10-CM | POA: Diagnosis not present

## 2019-09-29 DIAGNOSIS — J301 Allergic rhinitis due to pollen: Secondary | ICD-10-CM | POA: Diagnosis not present

## 2019-10-04 DIAGNOSIS — J301 Allergic rhinitis due to pollen: Secondary | ICD-10-CM | POA: Diagnosis not present

## 2019-10-06 DIAGNOSIS — J301 Allergic rhinitis due to pollen: Secondary | ICD-10-CM | POA: Diagnosis not present

## 2019-10-11 DIAGNOSIS — J301 Allergic rhinitis due to pollen: Secondary | ICD-10-CM | POA: Diagnosis not present

## 2019-10-13 DIAGNOSIS — J301 Allergic rhinitis due to pollen: Secondary | ICD-10-CM | POA: Diagnosis not present

## 2019-10-20 DIAGNOSIS — J301 Allergic rhinitis due to pollen: Secondary | ICD-10-CM | POA: Diagnosis not present

## 2019-10-25 DIAGNOSIS — J301 Allergic rhinitis due to pollen: Secondary | ICD-10-CM | POA: Diagnosis not present

## 2019-10-27 DIAGNOSIS — J301 Allergic rhinitis due to pollen: Secondary | ICD-10-CM | POA: Diagnosis not present

## 2019-11-01 DIAGNOSIS — J301 Allergic rhinitis due to pollen: Secondary | ICD-10-CM | POA: Diagnosis not present

## 2019-11-03 DIAGNOSIS — J301 Allergic rhinitis due to pollen: Secondary | ICD-10-CM | POA: Diagnosis not present

## 2019-11-08 DIAGNOSIS — J301 Allergic rhinitis due to pollen: Secondary | ICD-10-CM | POA: Diagnosis not present

## 2019-11-10 DIAGNOSIS — J301 Allergic rhinitis due to pollen: Secondary | ICD-10-CM | POA: Diagnosis not present

## 2019-11-15 DIAGNOSIS — J301 Allergic rhinitis due to pollen: Secondary | ICD-10-CM | POA: Diagnosis not present

## 2019-11-17 DIAGNOSIS — J301 Allergic rhinitis due to pollen: Secondary | ICD-10-CM | POA: Diagnosis not present

## 2019-11-22 DIAGNOSIS — J301 Allergic rhinitis due to pollen: Secondary | ICD-10-CM | POA: Diagnosis not present

## 2019-11-24 DIAGNOSIS — J301 Allergic rhinitis due to pollen: Secondary | ICD-10-CM | POA: Diagnosis not present

## 2019-11-30 DIAGNOSIS — J301 Allergic rhinitis due to pollen: Secondary | ICD-10-CM | POA: Diagnosis not present

## 2019-12-02 DIAGNOSIS — J301 Allergic rhinitis due to pollen: Secondary | ICD-10-CM | POA: Diagnosis not present

## 2019-12-06 DIAGNOSIS — J301 Allergic rhinitis due to pollen: Secondary | ICD-10-CM | POA: Diagnosis not present

## 2019-12-08 DIAGNOSIS — J301 Allergic rhinitis due to pollen: Secondary | ICD-10-CM | POA: Diagnosis not present

## 2019-12-16 DIAGNOSIS — Z1231 Encounter for screening mammogram for malignant neoplasm of breast: Secondary | ICD-10-CM | POA: Diagnosis not present

## 2019-12-30 ENCOUNTER — Other Ambulatory Visit: Payer: Self-pay

## 2019-12-30 ENCOUNTER — Ambulatory Visit (HOSPITAL_COMMUNITY)
Admission: RE | Admit: 2019-12-30 | Discharge: 2019-12-30 | Disposition: A | Discharge status: Home / Self Care | Payer: Medicare Other | Source: Ambulatory Visit | Attending: Acute Care | Admitting: Acute Care

## 2019-12-30 DIAGNOSIS — F17210 Nicotine dependence, cigarettes, uncomplicated: Secondary | ICD-10-CM

## 2019-12-30 DIAGNOSIS — F17200 Nicotine dependence, unspecified, uncomplicated: Secondary | ICD-10-CM | POA: Insufficient documentation

## 2019-12-30 DIAGNOSIS — Z87891 Personal history of nicotine dependence: Secondary | ICD-10-CM

## 2019-12-30 DIAGNOSIS — Z122 Encounter for screening for malignant neoplasm of respiratory organs: Secondary | ICD-10-CM | POA: Diagnosis not present

## 2020-01-05 NOTE — Progress Notes (Signed)
This patient is on statin therapy and she is followed by [facility] [place]

## 2020-01-05 NOTE — Progress Notes (Signed)
Please call patient and let them  know their  low dose Ct was read as a Lung RADS 2: nodules that are benign in appearance and behavior with a very low likelihood of becoming a clinically active cancer due to size or lack of growth. Recommendation per radiology is for a repeat LDCT in 12 months. .Please let them  know we will order and schedule their  annual screening scan for 12/2020. Please let them  know there was notation of CAD on their  scan.  Please remind the patient  that this is a non-gated exam therefore degree or severity of disease  cannot be determined. Please have them  follow up with their PCP regarding potential risk factor modification, dietary therapy or pharmacologic therapy if clinically indicated. Pt.  is is currently on statin therapy. Please place order for annual  screening scan for  12/2020 and fax results to PCP. Thanks so much.

## 2020-01-06 ENCOUNTER — Telehealth: Payer: Self-pay | Admitting: Acute Care

## 2020-01-06 DIAGNOSIS — F17210 Nicotine dependence, cigarettes, uncomplicated: Secondary | ICD-10-CM

## 2020-01-06 DIAGNOSIS — Z87891 Personal history of nicotine dependence: Secondary | ICD-10-CM

## 2020-01-06 NOTE — Telephone Encounter (Signed)
Pt informed of CT results per Sarah Groce, NP.  PT verbalized understanding.  Copy sent to PCP.  Order placed for 1 yr f/u CT.  

## 2020-01-10 DIAGNOSIS — Z299 Encounter for prophylactic measures, unspecified: Secondary | ICD-10-CM | POA: Diagnosis not present

## 2020-01-10 DIAGNOSIS — Z7189 Other specified counseling: Secondary | ICD-10-CM | POA: Diagnosis not present

## 2020-01-10 DIAGNOSIS — Z0000 Encounter for general adult medical examination without abnormal findings: Secondary | ICD-10-CM | POA: Diagnosis not present

## 2020-01-12 DIAGNOSIS — E782 Mixed hyperlipidemia: Secondary | ICD-10-CM | POA: Diagnosis not present

## 2020-01-12 DIAGNOSIS — M810 Age-related osteoporosis without current pathological fracture: Secondary | ICD-10-CM | POA: Diagnosis not present

## 2020-01-12 DIAGNOSIS — Z79899 Other long term (current) drug therapy: Secondary | ICD-10-CM | POA: Diagnosis not present

## 2020-01-12 DIAGNOSIS — R5383 Other fatigue: Secondary | ICD-10-CM | POA: Diagnosis not present

## 2020-01-27 DIAGNOSIS — Z20822 Contact with and (suspected) exposure to covid-19: Secondary | ICD-10-CM | POA: Diagnosis not present

## 2020-02-14 ENCOUNTER — Ambulatory Visit (INDEPENDENT_AMBULATORY_CARE_PROVIDER_SITE_OTHER): Payer: Medicare Other | Admitting: Gastroenterology

## 2020-02-14 ENCOUNTER — Other Ambulatory Visit: Payer: Self-pay

## 2020-02-14 ENCOUNTER — Encounter: Payer: Self-pay | Admitting: Gastroenterology

## 2020-02-14 VITALS — BP 115/75 | HR 103 | Temp 97.3°F | Ht 66.0 in | Wt 172.0 lb

## 2020-02-14 DIAGNOSIS — K219 Gastro-esophageal reflux disease without esophagitis: Secondary | ICD-10-CM

## 2020-02-14 DIAGNOSIS — R109 Unspecified abdominal pain: Secondary | ICD-10-CM | POA: Diagnosis not present

## 2020-02-14 MED ORDER — DICYCLOMINE HCL 10 MG PO CAPS: 10.0000 mg | ORAL_CAPSULE | Freq: Three times a day (TID) | ORAL | 0 refills | Status: DC

## 2020-02-14 NOTE — Progress Notes (Signed)
CC'ED TO PCP 

## 2020-02-14 NOTE — Progress Notes (Signed)
Primary Care Physician: Glenda Chroman, MD  Primary Gastroenterologist:  Garfield Cornea, MD   Chief Complaint  Patient presents with  . Gastroesophageal Reflux    doing fine    HPI: Destiny Villanueva is a 59 y.o. female here for follow-up of EGD and colonoscopy.  She has a history of chronic GERD, dysphagia, tubular adenomas.  History of suspected fatty liver with enlarged spleen noted on prior CT in 2018.  Referred to hematology for mild splenomegaly and chronic thrombocytopenia without evidence of cirrhosis.  No hematological source to explain splenomegaly found.  EGD and colonoscopy completed back in April.  She had mild erosive reflux esophagitis, Schatzki ring status post dilation, hiatal hernia.  Multiple tubular adenomas removed from the colon.  Plan for 3-year surveillance colonoscopy.  Reflux is well controlled.  Dysphagia much improved status post esophageal dilation.  No nausea or vomiting.  She has had some postprandial fecal urgency and loose stool especially after morning meals.  Has had this off and on in the past but more persistent the past couple of weeks.  No nocturnal diarrhea.  No melena or rectal bleeding.  Denies any abdominal pain.       Current Outpatient Medications  Medication Sig Dispense Refill  . acetaminophen (TYLENOL) 325 MG tablet Take 325 mg by mouth every 6 (six) hours as needed for moderate pain or headache.    . albuterol (PROVENTIL HFA;VENTOLIN HFA) 108 (90 Base) MCG/ACT inhaler Inhale 2 puffs into the lungs every 6 (six) hours as needed for wheezing or shortness of breath. 3 Inhaler 0  . albuterol (PROVENTIL) (2.5 MG/3ML) 0.083% nebulizer solution Take 3 mLs (2.5 mg total) by nebulization every 6 (six) hours as needed for wheezing or shortness of breath. 75 mL 12  . calcium carbonate (OSCAL) 1500 (600 Ca) MG TABS tablet Take 600 mg of elemental calcium by mouth daily at 12 noon.    . Cholecalciferol (DIALYVITE VITAMIN D 5000) 125 MCG (5000 UT)  capsule Take 5,000 Units by mouth daily at 12 noon.    . citalopram (CELEXA) 40 MG tablet Take 40 mg by mouth at bedtime.    Marland Kitchen dextromethorphan-guaiFENesin (MUCINEX DM) 30-600 MG 12hr tablet Take 1 tablet by mouth at bedtime as needed (congestion).    . fluticasone (FLONASE) 50 MCG/ACT nasal spray Place 1 spray into both nostrils daily as needed for allergies or rhinitis.     Marland Kitchen lovastatin (MEVACOR) 10 MG tablet Take 10 mg by mouth at bedtime.    . RABEprazole (ACIPHEX) 20 MG tablet TAKE 1 TABLET BY MOUTH ONCE DAILY BEFORE  BREAKFAST (Patient taking differently: Take 20 mg by mouth daily at 12 noon. ) 30 tablet 5   No current facility-administered medications for this visit.    Allergies as of 02/14/2020 - Review Complete 02/14/2020  Allergen Reaction Noted  . Aspirin Nausea And Vomiting 07/03/2014  . Ciprofloxacin  07/03/2014  . Penicillins Hives 07/03/2014  . Alendronate Other (See Comments) 07/18/2013  . Fluocinolone Dermatitis 07/18/2013  . Sulfa antibiotics Rash 02/08/2019     ROS:  General: Negative for anorexia, weight loss, fever, chills, fatigue, weakness. ENT: Negative for hoarseness, difficulty swallowing , nasal congestion. CV: Negative for chest pain, angina, palpitations, dyspnea on exertion, peripheral edema.  Respiratory: Negative for dyspnea at rest, dyspnea on exertion, cough, sputum, wheezing.  GI: See history of present illness. GU:  Negative for dysuria, hematuria, urinary incontinence, urinary frequency, nocturnal urination.  Endo: Negative for unusual weight  change.    Physical Examination:   BP 115/75   Pulse (!) 103   Temp (!) 97.3 F (36.3 C)   Ht 5\' 6"  (1.676 m)   Wt 172 lb (78 kg)   BMI 27.76 kg/m   General: Well-nourished, well-developed in no acute distress.  Eyes: No icterus. Mouth: masked. Lungs: Clear to auscultation bilaterally.  Heart: Regular rate and rhythm, no murmurs rubs or gallops.  Abdomen: Bowel sounds are normal, nontender,  nondistended, no hepatosplenomegaly or masses, no abdominal bruits or hernia , no rebound or guarding.   Extremities: No lower extremity edema. No clubbing or deformities. Neuro: Alert and oriented x 4   Skin: Warm and dry, no jaundice.   Psych: Alert and cooperative, normal mood and affect.  Labs:  Lab Results  Component Value Date   WBC 4.3 08/12/2019   HGB 13.2 08/12/2019   HCT 42.1 08/12/2019   MCV 85.4 08/12/2019   PLT 131 (L) 08/12/2019      Imaging Studies: No results found.  Impression/plan:  59 year old female presenting for follow-up of EGD and colonoscopy earlier this year.  GERD: Erosive reflux esophagitis, Schatzki ring on EGD in April.  Esophageal dilation performed.  Patient is on AcipHex 20 mg daily.  Clinically doing well.  Reinforced antireflux measures.  The office in 6 months.  History of colonic adenomatous polyps.  Surveillance colonoscopy planned in April 2024.  Postprandial abdominal cramping/fecal urgency/loose stools: Possible underlying IBS.  Trial of dicyclomine 10 mg before meals and at bedtime as needed.  If persisting symptoms or progressive abdominal pain she should let us know.  Otherwise we will see her back in 6 months.  Fatty liver seen on prior imaging.  LFTs have remained normal.  Reevaluate in 6 months.  Recommend exercise as tolerated.  Splenomegaly: Seen on prior imaging.  Chronic mild thrombocytopenia.  Has been evaluated by hematology in the past.  Evidence of hepatic steatosis but no evidence of cirrhosis.  Continue to monitor.  Consider repeat ultrasound at next office visit in 6 months.

## 2020-02-14 NOTE — Patient Instructions (Signed)
1. Continue aciphex 20mg  daily before breakfast.  2. Trial of dicyclomine 10mg  up to four times daily before meals and at bedtime for abdominal cramping and loose stools. Try taking at least once in the morning right now since you are having more frequent symptoms.  3. Call if progressive abdominal pain or symptoms do not settle down. 4. Return to the office in six months.

## 2020-03-27 DIAGNOSIS — R070 Pain in throat: Secondary | ICD-10-CM | POA: Diagnosis not present

## 2020-03-27 DIAGNOSIS — J069 Acute upper respiratory infection, unspecified: Secondary | ICD-10-CM | POA: Diagnosis not present

## 2020-03-27 DIAGNOSIS — Z20822 Contact with and (suspected) exposure to covid-19: Secondary | ICD-10-CM | POA: Diagnosis not present

## 2020-03-27 DIAGNOSIS — M791 Myalgia, unspecified site: Secondary | ICD-10-CM | POA: Diagnosis not present

## 2020-03-28 DIAGNOSIS — M791 Myalgia, unspecified site: Secondary | ICD-10-CM | POA: Diagnosis not present

## 2020-03-28 DIAGNOSIS — J441 Chronic obstructive pulmonary disease with (acute) exacerbation: Secondary | ICD-10-CM | POA: Diagnosis not present

## 2020-03-28 DIAGNOSIS — R070 Pain in throat: Secondary | ICD-10-CM | POA: Diagnosis not present

## 2020-04-10 DIAGNOSIS — I700 Atherosclerosis of aorta: Secondary | ICD-10-CM | POA: Diagnosis not present

## 2020-04-10 DIAGNOSIS — R300 Dysuria: Secondary | ICD-10-CM | POA: Diagnosis not present

## 2020-04-10 DIAGNOSIS — Z299 Encounter for prophylactic measures, unspecified: Secondary | ICD-10-CM | POA: Diagnosis not present

## 2020-04-10 DIAGNOSIS — J449 Chronic obstructive pulmonary disease, unspecified: Secondary | ICD-10-CM | POA: Diagnosis not present

## 2020-04-10 DIAGNOSIS — F17210 Nicotine dependence, cigarettes, uncomplicated: Secondary | ICD-10-CM | POA: Diagnosis not present

## 2020-04-10 DIAGNOSIS — R739 Hyperglycemia, unspecified: Secondary | ICD-10-CM | POA: Diagnosis not present

## 2020-05-17 ENCOUNTER — Telehealth: Payer: Self-pay

## 2020-05-17 NOTE — Telephone Encounter (Signed)
Mychart message sent. Refill request for Aciphex 20 mg daily. Routing to [facility] refill

## 2020-08-14 ENCOUNTER — Ambulatory Visit: Payer: Medicare Other | Admitting: Gastroenterology

## 2020-08-15 DIAGNOSIS — F17210 Nicotine dependence, cigarettes, uncomplicated: Secondary | ICD-10-CM | POA: Diagnosis not present

## 2020-08-15 DIAGNOSIS — Z299 Encounter for prophylactic measures, unspecified: Secondary | ICD-10-CM | POA: Diagnosis not present

## 2020-08-15 DIAGNOSIS — J449 Chronic obstructive pulmonary disease, unspecified: Secondary | ICD-10-CM | POA: Diagnosis not present

## 2020-08-15 DIAGNOSIS — I700 Atherosclerosis of aorta: Secondary | ICD-10-CM | POA: Diagnosis not present

## 2020-08-15 DIAGNOSIS — K7460 Unspecified cirrhosis of liver: Secondary | ICD-10-CM | POA: Diagnosis not present

## 2020-09-25 DIAGNOSIS — J309 Allergic rhinitis, unspecified: Secondary | ICD-10-CM | POA: Diagnosis not present

## 2020-10-11 DIAGNOSIS — Z299 Encounter for prophylactic measures, unspecified: Secondary | ICD-10-CM | POA: Diagnosis not present

## 2020-10-11 DIAGNOSIS — R21 Rash and other nonspecific skin eruption: Secondary | ICD-10-CM | POA: Diagnosis not present

## 2020-10-11 DIAGNOSIS — J449 Chronic obstructive pulmonary disease, unspecified: Secondary | ICD-10-CM | POA: Diagnosis not present

## 2020-10-11 DIAGNOSIS — D696 Thrombocytopenia, unspecified: Secondary | ICD-10-CM | POA: Diagnosis not present

## 2020-10-11 DIAGNOSIS — I700 Atherosclerosis of aorta: Secondary | ICD-10-CM | POA: Diagnosis not present

## 2020-10-31 ENCOUNTER — Ambulatory Visit: Payer: Medicare Other | Admitting: Gastroenterology

## 2020-11-02 DIAGNOSIS — J309 Allergic rhinitis, unspecified: Secondary | ICD-10-CM | POA: Diagnosis not present

## 2020-11-15 DIAGNOSIS — Z299 Encounter for prophylactic measures, unspecified: Secondary | ICD-10-CM | POA: Diagnosis not present

## 2020-11-15 DIAGNOSIS — M5430 Sciatica, unspecified side: Secondary | ICD-10-CM | POA: Diagnosis not present

## 2020-11-15 DIAGNOSIS — M545 Low back pain, unspecified: Secondary | ICD-10-CM | POA: Diagnosis not present

## 2021-01-06 DIAGNOSIS — N390 Urinary tract infection, site not specified: Secondary | ICD-10-CM | POA: Diagnosis not present

## 2021-01-06 DIAGNOSIS — R350 Frequency of micturition: Secondary | ICD-10-CM | POA: Diagnosis not present

## 2021-01-11 DIAGNOSIS — H5211 Myopia, right eye: Secondary | ICD-10-CM | POA: Diagnosis not present

## 2021-01-11 IMAGING — CT CT CHEST LUNG CANCER SCREENING LOW DOSE W/O CM
1 of 2 series · 10 of 20 positions shown, 13 images · non-contrast
Comparison: Low-dose lung cancer screening chest CT 12/13/2018.

CLINICAL DATA: 59-year-old female current smoker with 41 pack-year
history of smoking. Lung cancer screening examination.

EXAM:
CT CHEST WITHOUT CONTRAST LOW-DOSE FOR LUNG CANCER SCREENING
TECHNIQUE: Multidetector CT imaging of the chest was performed following the
standard protocol without IV contrast.

[ct lung segmentation data · axial · 0.58mm/px · z∈[+1048,+1048]mm · 10 of 302 frames shown]
[frame 1/302  mediastinal]
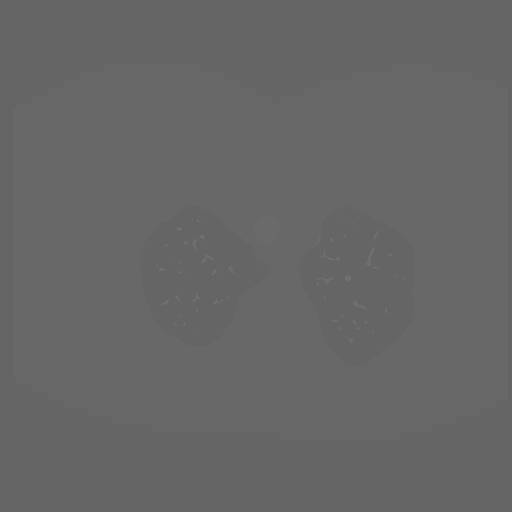
[frame 1/302  lung]
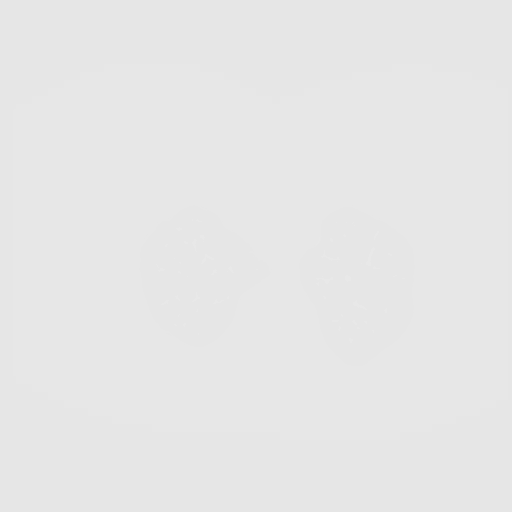
[frame 34/302  lung]
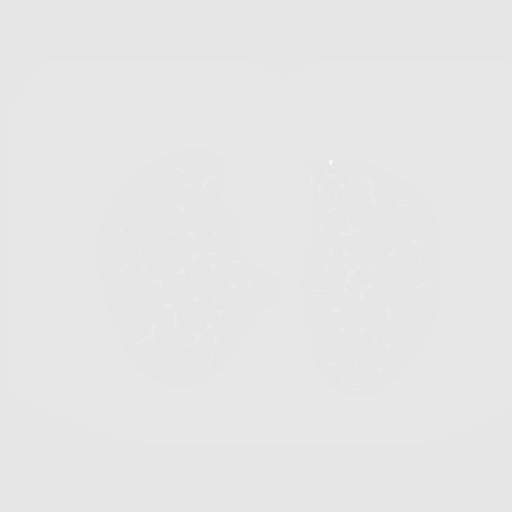
[frame 67/302  lung]
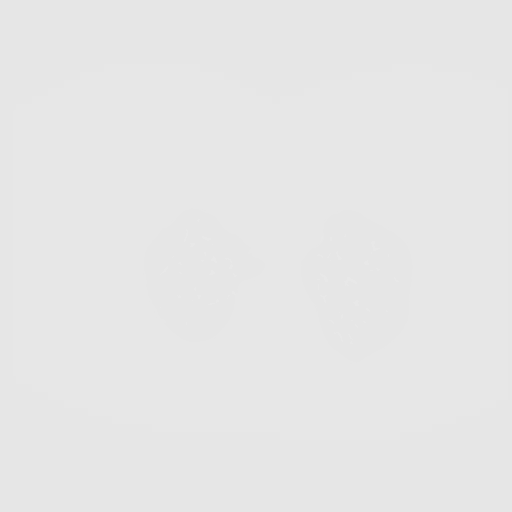
[frame 101/302  lung]
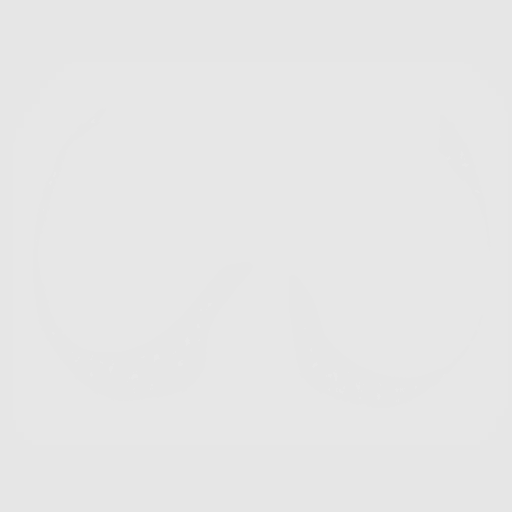
[frame 134/302  mediastinal]
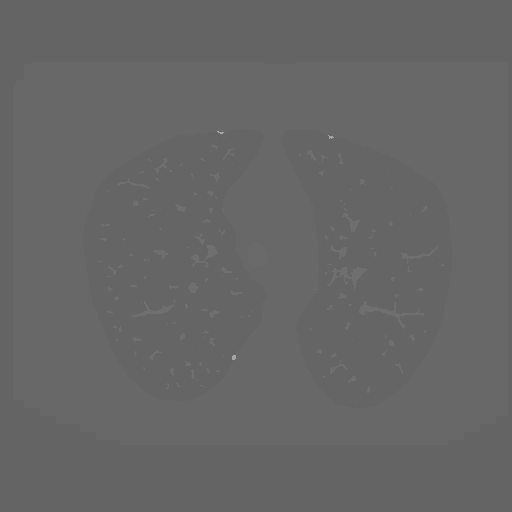
[frame 134/302  lung]
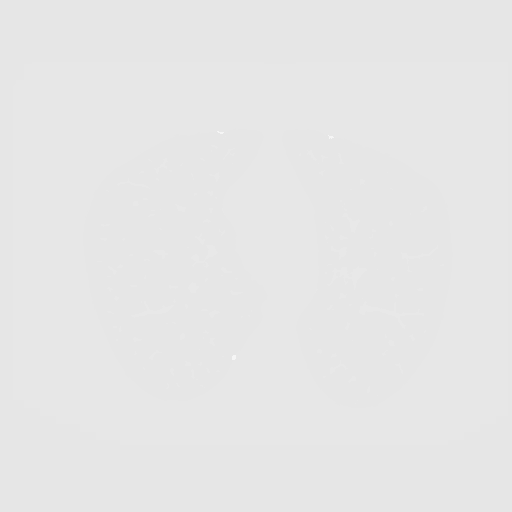
[frame 168/302  lung]
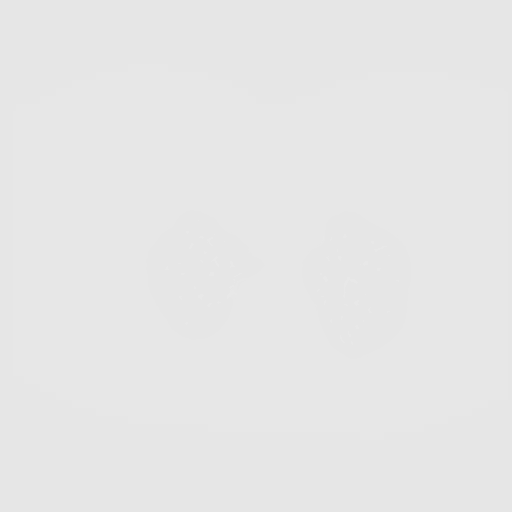
[frame 201/302  lung]
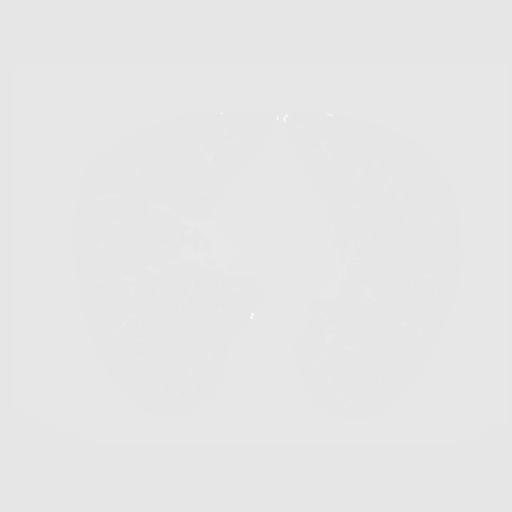
[frame 235/302  lung]
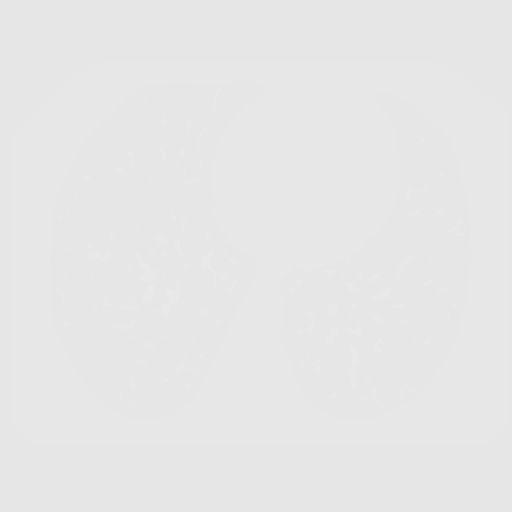
[frame 268/302  mediastinal]
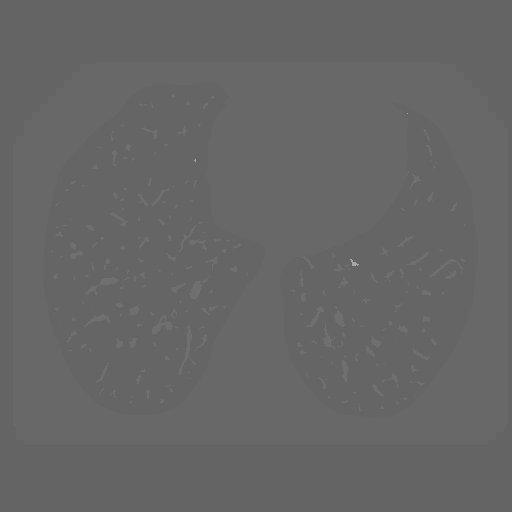
[frame 268/302  lung]
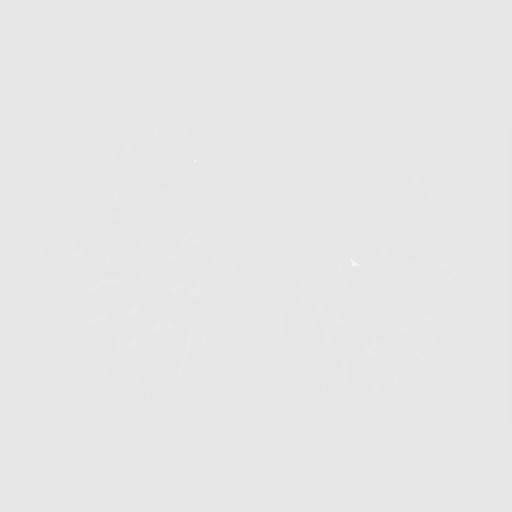
[frame 302/302  lung]
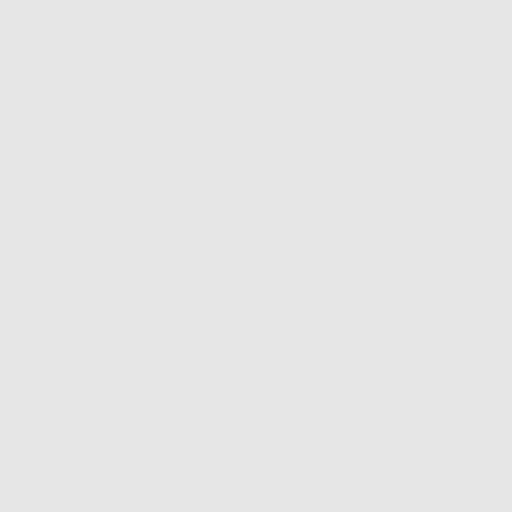

[10 of 20 positions shown; findings below may reference images not displayed]

FINDINGS: Cardiovascular: Heart size is normal. There is no significant
pericardial fluid, thickening or pericardial calcification. There is
aortic atherosclerosis, as well as atherosclerosis of the great
vessels of the mediastinum and the coronary arteries, including
calcified atherosclerotic plaque in the left anterior descending
coronary artery.

Mediastinum/Nodes: No pathologically enlarged mediastinal or hilar
lymph nodes. Please note that accurate exclusion of hilar adenopathy
is limited on noncontrast CT scans. Esophagus is unremarkable in
appearance. No axillary lymphadenopathy.

Lungs/Pleura: Small pulmonary nodule in the left lower lobe (axial
image 219 of series 3), with a volume derived mean diameter of
mm, stable in retrospect compared to the prior study. No other
larger more suspicious appearing pulmonary nodules or masses are
noted. No acute consolidative airspace disease. No pleural
effusions. Diffuse bronchial wall thickening with mild centrilobular
and paraseptal emphysema.

Upper Abdomen: Diffuse low attenuation throughout the visualized
hepatic parenchyma, indicative of hepatic steatosis.

Musculoskeletal: There are no aggressive appearing lytic or blastic
lesions noted in the visualized portions of the skeleton.
IMPRESSION: 1. Lung-RADS 2S, benign appearance or behavior. Continue annual
screening with low-dose chest CT without contrast in 12 months.
2. The "S" modifier above refers to potentially clinically
significant non lung cancer related findings. Specifically, there is
aortic atherosclerosis, in addition to left anterior descending
coronary artery disease. Please note that although the presence of
coronary artery calcium documents the presence of coronary artery
disease, the severity of this disease and any potential stenosis
cannot be assessed on this non-gated CT examination. Assessment for
potential risk factor modification, dietary therapy or pharmacologic
therapy may be warranted, if clinically indicated.
3. Mild diffuse bronchial wall thickening with mild centrilobular
and paraseptal emphysema; imaging findings suggestive of underlying
COPD.
4. Diffuse low attenuation throughout the visualized hepatic
parenchyma, indicative of hepatic steatosis.

Aortic Atherosclerosis (WJTPK-B0G.G) and Emphysema (WJTPK-AFV.O).

## 2021-01-22 ENCOUNTER — Other Ambulatory Visit: Payer: Self-pay | Admitting: *Deleted

## 2021-01-22 DIAGNOSIS — Z87891 Personal history of nicotine dependence: Secondary | ICD-10-CM

## 2021-01-22 DIAGNOSIS — F17210 Nicotine dependence, cigarettes, uncomplicated: Secondary | ICD-10-CM

## 2021-01-22 DIAGNOSIS — N390 Urinary tract infection, site not specified: Secondary | ICD-10-CM | POA: Diagnosis not present

## 2021-01-22 DIAGNOSIS — Z299 Encounter for prophylactic measures, unspecified: Secondary | ICD-10-CM | POA: Diagnosis not present

## 2021-02-01 DIAGNOSIS — R350 Frequency of micturition: Secondary | ICD-10-CM | POA: Diagnosis not present

## 2021-02-01 DIAGNOSIS — I700 Atherosclerosis of aorta: Secondary | ICD-10-CM | POA: Diagnosis not present

## 2021-02-01 DIAGNOSIS — Z299 Encounter for prophylactic measures, unspecified: Secondary | ICD-10-CM | POA: Diagnosis not present

## 2021-02-01 DIAGNOSIS — J449 Chronic obstructive pulmonary disease, unspecified: Secondary | ICD-10-CM | POA: Diagnosis not present

## 2021-02-01 DIAGNOSIS — N390 Urinary tract infection, site not specified: Secondary | ICD-10-CM | POA: Diagnosis not present

## 2021-02-26 ENCOUNTER — Other Ambulatory Visit: Payer: Self-pay

## 2021-02-26 ENCOUNTER — Ambulatory Visit (HOSPITAL_COMMUNITY)
Admission: RE | Admit: 2021-02-26 | Discharge: 2021-02-26 | Disposition: A | Discharge status: Home / Self Care | Payer: Medicare Other | Source: Ambulatory Visit | Attending: Acute Care | Admitting: Acute Care

## 2021-02-26 DIAGNOSIS — Z87891 Personal history of nicotine dependence: Secondary | ICD-10-CM | POA: Insufficient documentation

## 2021-02-26 DIAGNOSIS — F17210 Nicotine dependence, cigarettes, uncomplicated: Secondary | ICD-10-CM | POA: Diagnosis not present

## 2021-03-04 ENCOUNTER — Other Ambulatory Visit: Payer: Self-pay | Admitting: Acute Care

## 2021-03-04 DIAGNOSIS — F17210 Nicotine dependence, cigarettes, uncomplicated: Secondary | ICD-10-CM

## 2021-03-04 DIAGNOSIS — Z87891 Personal history of nicotine dependence: Secondary | ICD-10-CM

## 2021-03-06 DIAGNOSIS — Z7189 Other specified counseling: Secondary | ICD-10-CM | POA: Diagnosis not present

## 2021-03-06 DIAGNOSIS — Z0000 Encounter for general adult medical examination without abnormal findings: Secondary | ICD-10-CM | POA: Diagnosis not present

## 2021-03-06 DIAGNOSIS — J449 Chronic obstructive pulmonary disease, unspecified: Secondary | ICD-10-CM | POA: Diagnosis not present

## 2021-03-06 DIAGNOSIS — Z299 Encounter for prophylactic measures, unspecified: Secondary | ICD-10-CM | POA: Diagnosis not present

## 2021-03-06 DIAGNOSIS — R5383 Other fatigue: Secondary | ICD-10-CM | POA: Diagnosis not present

## 2021-03-06 DIAGNOSIS — E7800 Pure hypercholesterolemia, unspecified: Secondary | ICD-10-CM | POA: Diagnosis not present

## 2021-03-06 DIAGNOSIS — F17210 Nicotine dependence, cigarettes, uncomplicated: Secondary | ICD-10-CM | POA: Diagnosis not present

## 2021-03-11 DIAGNOSIS — R5383 Other fatigue: Secondary | ICD-10-CM | POA: Diagnosis not present

## 2021-03-11 DIAGNOSIS — E7800 Pure hypercholesterolemia, unspecified: Secondary | ICD-10-CM | POA: Diagnosis not present

## 2021-03-11 DIAGNOSIS — Z79899 Other long term (current) drug therapy: Secondary | ICD-10-CM | POA: Diagnosis not present

## 2021-03-27 DIAGNOSIS — Z882 Allergy status to sulfonamides status: Secondary | ICD-10-CM | POA: Diagnosis not present

## 2021-03-27 DIAGNOSIS — Z1211 Encounter for screening for malignant neoplasm of colon: Secondary | ICD-10-CM | POA: Diagnosis not present

## 2021-03-27 DIAGNOSIS — Z720 Tobacco use: Secondary | ICD-10-CM | POA: Diagnosis not present

## 2021-03-27 DIAGNOSIS — Z881 Allergy status to other antibiotic agents status: Secondary | ICD-10-CM | POA: Diagnosis not present

## 2021-03-27 DIAGNOSIS — Z13820 Encounter for screening for osteoporosis: Secondary | ICD-10-CM | POA: Diagnosis not present

## 2021-03-27 DIAGNOSIS — Z880 Allergy status to penicillin: Secondary | ICD-10-CM | POA: Diagnosis not present

## 2021-04-17 DIAGNOSIS — R188 Other ascites: Secondary | ICD-10-CM | POA: Diagnosis not present

## 2021-04-17 DIAGNOSIS — D696 Thrombocytopenia, unspecified: Secondary | ICD-10-CM | POA: Diagnosis not present

## 2021-04-17 DIAGNOSIS — Z2821 Immunization not carried out because of patient refusal: Secondary | ICD-10-CM | POA: Diagnosis not present

## 2021-04-17 DIAGNOSIS — Z299 Encounter for prophylactic measures, unspecified: Secondary | ICD-10-CM | POA: Diagnosis not present

## 2021-04-17 DIAGNOSIS — K7460 Unspecified cirrhosis of liver: Secondary | ICD-10-CM | POA: Diagnosis not present

## 2021-04-17 DIAGNOSIS — N390 Urinary tract infection, site not specified: Secondary | ICD-10-CM | POA: Diagnosis not present

## 2021-04-18 ENCOUNTER — Encounter: Payer: Self-pay | Admitting: Internal Medicine

## 2021-04-18 ENCOUNTER — Encounter: Payer: Self-pay | Admitting: Gynecologic Oncology

## 2021-04-18 DIAGNOSIS — Z1231 Encounter for screening mammogram for malignant neoplasm of breast: Secondary | ICD-10-CM | POA: Diagnosis not present
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
## 2021-04-19 ENCOUNTER — Encounter: Payer: Self-pay | Admitting: Gynecologic Oncology

## 2021-04-19 ENCOUNTER — Other Ambulatory Visit: Payer: Self-pay

## 2021-04-19 ENCOUNTER — Inpatient Hospital Stay (HOSPITAL_BASED_OUTPATIENT_CLINIC_OR_DEPARTMENT_OTHER): Payer: Medicare Other | Admitting: Gynecologic Oncology

## 2021-04-19 ENCOUNTER — Inpatient Hospital Stay: Payer: Medicare Other | Attending: Gynecologic Oncology | Admitting: Gynecologic Oncology

## 2021-04-19 VITALS — BP 118/75 | HR 94 | Temp 97.9°F | Resp 20 | Wt 178.9 lb

## 2021-04-19 DIAGNOSIS — A6000 Herpesviral infection of urogenital system, unspecified: Secondary | ICD-10-CM | POA: Insufficient documentation

## 2021-04-19 DIAGNOSIS — F419 Anxiety disorder, unspecified: Secondary | ICD-10-CM | POA: Insufficient documentation

## 2021-04-19 DIAGNOSIS — Z803 Family history of malignant neoplasm of breast: Secondary | ICD-10-CM | POA: Diagnosis not present

## 2021-04-19 DIAGNOSIS — Z79899 Other long term (current) drug therapy: Secondary | ICD-10-CM | POA: Insufficient documentation

## 2021-04-19 DIAGNOSIS — M1990 Unspecified osteoarthritis, unspecified site: Secondary | ICD-10-CM | POA: Insufficient documentation

## 2021-04-19 DIAGNOSIS — N3946 Mixed incontinence: Secondary | ICD-10-CM

## 2021-04-19 DIAGNOSIS — Z780 Asymptomatic menopausal state: Secondary | ICD-10-CM | POA: Diagnosis not present

## 2021-04-19 DIAGNOSIS — Z8719 Personal history of other diseases of the digestive system: Secondary | ICD-10-CM | POA: Diagnosis not present

## 2021-04-19 DIAGNOSIS — G4730 Sleep apnea, unspecified: Secondary | ICD-10-CM | POA: Insufficient documentation

## 2021-04-19 DIAGNOSIS — M47812 Spondylosis without myelopathy or radiculopathy, cervical region: Secondary | ICD-10-CM | POA: Insufficient documentation

## 2021-04-19 DIAGNOSIS — K219 Gastro-esophageal reflux disease without esophagitis: Secondary | ICD-10-CM | POA: Insufficient documentation

## 2021-04-19 DIAGNOSIS — J439 Emphysema, unspecified: Secondary | ICD-10-CM | POA: Diagnosis not present

## 2021-04-19 DIAGNOSIS — D071 Carcinoma in situ of vulva: Secondary | ICD-10-CM | POA: Diagnosis not present

## 2021-04-19 DIAGNOSIS — K760 Fatty (change of) liver, not elsewhere classified: Secondary | ICD-10-CM | POA: Insufficient documentation

## 2021-04-19 DIAGNOSIS — M4800 Spinal stenosis, site unspecified: Secondary | ICD-10-CM | POA: Diagnosis not present

## 2021-04-19 DIAGNOSIS — F32A Depression, unspecified: Secondary | ICD-10-CM | POA: Diagnosis not present

## 2021-04-19 MED ORDER — OXYCODONE HCL 5 MG PO TABS: 5.0000 mg | ORAL_TABLET | ORAL | 0 refills | Status: DC | PRN

## 2021-04-19 MED ORDER — SENNOSIDES-DOCUSATE SODIUM 8.6-50 MG PO TABS: 2.0000 | ORAL_TABLET | Freq: Every day | ORAL | 0 refills | Status: DC

## 2021-04-19 NOTE — Progress Notes (Signed)
Patient here with her daughter for new patient consultation with Dr. Berline Lopes and for a pre-operative discussion prior to her scheduled surgery on May 08, 2021. She is scheduled for simple vulvectomy. The surgery was discussed in detail.  See after visit summary for additional details. Visual aids used to discuss items related to surgery.   Discussed post-op pain management in detail including the aspects of the enhanced recovery pathway.  Advised her that a new prescription would be sent in for oxycodone and it is only to be used for after her upcoming surgery.  We discussed the use of tylenol post-op and to monitor for a maximum of 4,000 mg in a 24 hour period.  Also prescribed sennakot to be used after surgery and to hold if having loose stools.  Discussed bowel regimen in detail.     Discussed the use of SCDs and measures to take at home to prevent DVT including frequent mobility.  Reportable signs and symptoms of DVT discussed. Post-operative instructions discussed and expectations for after surgery. Incisional care discussed as well including reportable signs and symptoms including erythema, drainage, wound separation.     5 minutes spent with the patient.  Verbalizing understanding of material discussed. No needs or concerns voiced at the end of the visit.   Advised patient and family to call for any needs.  Advised that her post-operative medications had been prescribed and could be picked up at any time.    This appointment is included in the global surgical bundle as pre-operative teaching and has no charge.

## 2021-04-19 NOTE — Patient Instructions (Signed)
We have placed a referral for you to meet with a uro-gynecologist for your urinary incontinence.  Preparing for your Surgery  Plan for surgery on May 08, 2021 with Dr. Jeral Pinch at [facility]. You will be scheduled for a simple vulvectomy (removal of the area on the vulva and closing the incision with dissolvable stitches.   Pre-operative Testing -You will receive a phone call from presurgical testing at [facility] to discuss surgery instructions and arrange for lab work if needed.  -Bring your insurance card, copy of an advanced directive if applicable, medication list.  -You should not be taking blood thinners or aspirin at least ten days prior to surgery unless instructed by your surgeon.  -Do not take supplements such as fish oil (omega 3), red yeast rice, turmeric before your surgery. You want to avoid medications with aspirin in them including headache powders such as BC or Goody's), Excedrin migraine.  Day Before Surgery at [place] will be advised you can have clear liquids up until 3 hours before your surgery.    Your role in recovery Your role is to become active as soon as directed by your doctor, while still giving yourself time to heal.  Rest when you feel tired. You will be asked to do the following in order to speed your recovery:  - Cough and breathe deeply. This helps to clear and expand your lungs and can prevent pneumonia after surgery.  - [place]. Do mild physical activity. Walking or moving your legs help your circulation and body functions return to normal. Do not try to get up or walk alone the first time after surgery.   -If you develop swelling on one leg or the other, pain in the back of your leg, redness/warmth in one of your legs, please call the office or go to the Emergency Room to have a doppler to rule out a blood clot. For shortness of breath, chest pain-seek care in the Emergency Room  as soon as possible. - Actively manage your pain. Managing your pain lets you move in comfort. We will ask you to rate your pain on a scale of zero to 10. It is your responsibility to tell your doctor or nurse where and how much you hurt so your pain can be treated.  Special Considerations -Your final pathology results from surgery should be available around one week after surgery and the results will be relayed to you when available.  -FMLA forms can be faxed to [phone] and please allow 5-7 business days for completion.  Pain Management After Surgery -You have been prescribed your pain medication (oxycodone) and bowel regimen medications before surgery so that you can have these available when you are discharged from the hospital. The pain medication is for use ONLY AFTER surgery and a new prescription will not be given.   -Make sure that you have Tylenol and Ibuprofen (OR ALEVE AT HOME, DO NOT TAKE WITH IBUPROFEN SINCE THEY WORK SIMILARLY) at home to use on a regular basis after surgery for pain control. We recommend alternating the medications every hour to six hours since they work differently and are processed in the body differently for pain relief.  -Review the attached handout on narcotic use and their risks and side effects.   Bowel Regimen -You have been prescribed Sennakot-S to take nightly to prevent constipation especially if you are taking the narcotic pain medication intermittently.  It is important to  prevent constipation and drink adequate amounts of liquids. You can stop taking this medication when you are not taking pain medication and you are back on your normal bowel routine.  Risks of Surgery Risks of surgery are low but include bleeding, infection, damage to surrounding structures, re-operation, blood clots, and very rarely death.  AFTER SURGERY INSTRUCTIONS  We recommend purchasing several bags of frozen green peas and dividing them into ziploc bags. You will want  to keep these in the freezer and have them ready to use as ice packs to the vulvar incision. Once the ice pack is no longer cold, you can get another from the freezer. The frozen peas mold to your body better than a regular ice pack.   Return to work:  1-2 weeks if applicable  Activity: 1. Be up and out of the bed during the day.  Take a nap if needed.  You may walk up steps but be careful and use the hand rail.  Stair climbing will tire you more than you think, you may need to stop part way and rest.   2. No lifting or straining for 2 weeks over 10 pounds. No pushing, pulling, straining for 2 weeks.  3. No driving for minimum 24 hours after surgery.  Do not drive if you are taking narcotic pain medicine and make sure that your reaction time has returned.   4. You can shower as soon as the next day after surgery. Shower daily. No tub baths or submerging your body in water until cleared by your surgeon. If you have the soap that was given to you by pre-surgical testing that was used before surgery, you do not need to use it afterwards because this can irritate your incisions.   5. No sexual activity and nothing in the vagina for 4 weeks.  6. You may experience vaginal spotting and discharge after surgery.  The spotting is normal but if you experience heavy bleeding, call our office.  7. Take Tylenol or ibuprofen (OR ALEVE) first for pain and only use narcotic pain medication for severe pain not relieved by the Tylenol or Ibuprofen.  Monitor your Tylenol intake to a max of 4,000 mg in a 24 hour period. You can alternate these medications after surgery.  Diet: 1. Low sodium Heart Healthy Diet is recommended but you are cleared to resume your normal (before surgery) diet after your procedure.  2. It is safe to use a laxative, such as Miralax or Colace, if you have difficulty moving your bowels. You have been prescribed Sennakot at bedtime every evening to keep bowel movements regular and to prevent  constipation.    Wound Care: 1. Keep clean and dry.  Shower daily.  Reasons to call the Doctor: Fever - Oral temperature greater than 100.4 degrees Fahrenheit Foul-smelling vaginal discharge Difficulty urinating Nausea and vomiting Increased pain at the site of the incision that is unrelieved with pain medicine. Difficulty breathing with or without chest pain New calf pain especially if only on one side Sudden, continuing increased vaginal bleeding with or without clots.   Contacts: For questions or concerns you should contact:  Dr. Jeral Pinch at [phone]  [name], NP at [phone]  After Hours: call [phone] and have the GYN Oncologist paged/contacted (after 5 pm or on the weekends).  Messages sent via mychart are for non-urgent matters and are not responded to after hours so for urgent needs, please call the after hours number.

## 2021-04-19 NOTE — Patient Instructions (Signed)
We have placed a referral for you to meet with a uro-gynecologist for your urinary incontinence.   Preparing for your Surgery   Plan for surgery on May 08, 2021 with Dr. Jeral Pinch at [facility]. You will be scheduled for a simple vulvectomy (removal of the area on the vulva and closing the incision with dissolvable stitches.    Pre-operative Testing -You will receive a phone call from presurgical testing at [facility] to discuss surgery instructions and arrange for lab work if needed.   -Bring your insurance card, copy of an advanced directive if applicable, medication list.   -You should not be taking blood thinners or aspirin at least ten days prior to surgery unless instructed by your surgeon.   -Do not take supplements such as fish oil (omega 3), red yeast rice, turmeric before your surgery. You want to avoid medications with aspirin in them including headache powders such as BC or Goody's), Excedrin migraine.   Day Before Surgery at [place] will be advised you can have clear liquids up until 3 hours before your surgery.     Your role in recovery Your role is to become active as soon as directed by your doctor, while still giving yourself time to heal.  Rest when you feel tired. You will be asked to do the following in order to speed your recovery:   - Cough and breathe deeply. This helps to clear and expand your lungs and can prevent pneumonia after surgery.  - [place]. Do mild physical activity. Walking or moving your legs help your circulation and body functions return to normal. Do not try to get up or walk alone the first time after surgery.   -If you develop swelling on one leg or the other, pain in the back of your leg, redness/warmth in one of your legs, please call the office or go to the Emergency Room to have a doppler to rule out a blood clot. For shortness of breath, chest pain-seek care in the  Emergency Room as soon as possible. - Actively manage your pain. Managing your pain lets you move in comfort. We will ask you to rate your pain on a scale of zero to 10. It is your responsibility to tell your doctor or nurse where and how much you hurt so your pain can be treated.   Special Considerations -Your final pathology results from surgery should be available around one week after surgery and the results will be relayed to you when available.   -FMLA forms can be faxed to [phone] and please allow 5-7 business days for completion.   Pain Management After Surgery -You have been prescribed your pain medication (oxycodone) and bowel regimen medications before surgery so that you can have these available when you are discharged from the hospital. The pain medication is for use ONLY AFTER surgery and a new prescription will not be given.    -Make sure that you have Tylenol and Ibuprofen (OR ALEVE AT HOME, DO NOT TAKE WITH IBUPROFEN SINCE THEY WORK SIMILARLY) at home to use on a regular basis after surgery for pain control. We recommend alternating the medications every hour to six hours since they work differently and are processed in the body differently for pain relief.   -Review the attached handout on narcotic use and their risks and side effects.    Bowel Regimen -You have been prescribed Sennakot-S to take nightly to prevent constipation  especially if you are taking the narcotic pain medication intermittently.  It is important to prevent constipation and drink adequate amounts of liquids. You can stop taking this medication when you are not taking pain medication and you are back on your normal bowel routine.   Risks of Surgery Risks of surgery are low but include bleeding, infection, damage to surrounding structures, re-operation, blood clots, and very rarely death.   AFTER SURGERY INSTRUCTIONS   We recommend purchasing several bags of frozen green peas and dividing them into  ziploc bags. You will want to keep these in the freezer and have them ready to use as ice packs to the vulvar incision. Once the ice pack is no longer cold, you can get another from the freezer. The frozen peas mold to your body better than a regular ice pack.    Return to work:  1-2 weeks if applicable   Activity: 1. Be up and out of the bed during the day.  Take a nap if needed.  You may walk up steps but be careful and use the hand rail.  Stair climbing will tire you more than you think, you may need to stop part way and rest.    2. No lifting or straining for 2 weeks over 10 pounds. No pushing, pulling, straining for 2 weeks.   3. No driving for minimum 24 hours after surgery.  Do not drive if you are taking narcotic pain medicine and make sure that your reaction time has returned.    4. You can shower as soon as the next day after surgery. Shower daily. No tub baths or submerging your body in water until cleared by your surgeon. If you have the soap that was given to you by pre-surgical testing that was used before surgery, you do not need to use it afterwards because this can irritate your incisions.    5. No sexual activity and nothing in the vagina for 4 weeks.   6. You may experience vaginal spotting and discharge after surgery.  The spotting is normal but if you experience heavy bleeding, call our office.   7. Take Tylenol or ibuprofen (OR ALEVE) first for pain and only use narcotic pain medication for severe pain not relieved by the Tylenol or Ibuprofen.  Monitor your Tylenol intake to a max of 4,000 mg in a 24 hour period. You can alternate these medications after surgery.   Diet: 1. Low sodium Heart Healthy Diet is recommended but you are cleared to resume your normal (before surgery) diet after your procedure.   2. It is safe to use a laxative, such as Miralax or Colace, if you have difficulty moving your bowels. You have been prescribed Sennakot at bedtime every evening to keep  bowel movements regular and to prevent constipation.     Wound Care: 1. Keep clean and dry.  Shower daily.   Reasons to call the Doctor: Fever - Oral temperature greater than 100.4 degrees Fahrenheit Foul-smelling vaginal discharge Difficulty urinating Nausea and vomiting Increased pain at the site of the incision that is unrelieved with pain medicine. Difficulty breathing with or without chest pain New calf pain especially if only on one side Sudden, continuing increased vaginal bleeding with or without clots.   Contacts: For questions or concerns you should contact:   Dr. Jeral Pinch at [phone]   [name], NP at [phone]   After Hours: call [phone] and have the GYN Oncologist paged/contacted (after 5 pm or on the weekends).  Messages sent via mychart are for non-urgent matters and are not responded to after hours so for urgent needs, please call the after hours number.

## 2021-04-25 ENCOUNTER — Encounter: Payer: Self-pay | Admitting: Gynecologic Oncology

## 2021-04-26 ENCOUNTER — Encounter: Payer: Self-pay | Admitting: Gynecologic Oncology

## 2021-04-29 ENCOUNTER — Telehealth: Payer: Self-pay

## 2021-04-29 NOTE — Telephone Encounter (Signed)
Spoke with Ms. 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 Radiation is only used for cancer diagnosis and may not be necessary in her case.  As far as an in office procedure the  area is bigger then what Dr. Berline Lopes would do in the office and with low platelets its too risky. Hopefully repeat labs will show improvement and we can reschedule surgery. Patient verbalized understanding and states she will call and follow up with her PCP again this afternoon to have her labs checked. Instructed to call with any needs.

## 2021-04-29 NOTE — Telephone Encounter (Signed)
Received call from Destiny Villanueva. Patient states she did receive MyChart message from [facility], NP and has not had a chance to write back yet. Patient has a call into her PCP to see if she can get her labwork re-checked as it has been about 5 weeks. Instructed patient to have her lab results and CT results sent to our office since we are unable to see those.  Patient inquiring if she can have procedure performed in office vs. operating room. And if she can have radiation instead of surgery.   Instructed patient that [name], NP would be notified and we will be in touch with her either through a mychart message or phone call. Patient verbalized understanding.

## 2021-04-29 NOTE — Telephone Encounter (Signed)
Attempted to reach patient to follow up. Unable to contact her. Left message requesting return call.

## 2021-04-29 NOTE — Telephone Encounter (Signed)
Attempted to contact Destiny Villanueva to follow up on mychart message from Friday 04/26/21 regarding her surgery. Unable to reach patient, left voicemail requesting return call.

## 2021-04-30 DIAGNOSIS — K7460 Unspecified cirrhosis of liver: Secondary | ICD-10-CM | POA: Diagnosis not present

## 2021-05-02 NOTE — Telephone Encounter (Signed)
Received return call from Ms. Nakajima. Advised patient that our office has received her latest lab values from Dr. Marcial Pacas office. Per [name], NP we will need platelets to be 100 or greater and a normal white count to proceed with surgery. Recommended patient follow up with her PCP to have her lab values checked in a week or two to see where she is. Patient verbalized understanding.

## 2021-05-02 NOTE — Telephone Encounter (Signed)
Following [place] with Destiny Villanueva regarding her MyChart message. Unable to contact patient. Left voicemail requesting return call.

## 2021-05-03 ENCOUNTER — Encounter: Payer: Self-pay | Admitting: Internal Medicine

## 2021-05-03 ENCOUNTER — Other Ambulatory Visit: Payer: Self-pay

## 2021-05-03 ENCOUNTER — Ambulatory Visit: Payer: Medicare Other | Admitting: Internal Medicine

## 2021-05-03 VITALS — BP 104/70 | HR 88 | Temp 97.1°F | Ht 66.0 in | Wt 176.2 lb

## 2021-05-03 DIAGNOSIS — R161 Splenomegaly, not elsewhere classified: Secondary | ICD-10-CM

## 2021-05-03 DIAGNOSIS — Z860100 Personal history of colon polyps, unspecified: Secondary | ICD-10-CM

## 2021-05-03 DIAGNOSIS — D696 Thrombocytopenia, unspecified: Secondary | ICD-10-CM

## 2021-05-03 DIAGNOSIS — K219 Gastro-esophageal reflux disease without esophagitis: Secondary | ICD-10-CM | POA: Diagnosis not present
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
## 2021-05-03 NOTE — Patient Instructions (Signed)
It was good seeing you again today!  We need to see the CT results before making further recommendations about any degree of liver disease  Once I have the CT results and hand for review I will be back in touch with you next week  Continue daily acid suppressor for GERD  As discussed, you will need a repeat colonoscopy in 2024  Further recommendations to follow.

## 2021-05-07 DIAGNOSIS — K7460 Unspecified cirrhosis of liver: Secondary | ICD-10-CM | POA: Diagnosis not present

## 2021-05-07 DIAGNOSIS — R161 Splenomegaly, not elsewhere classified: Secondary | ICD-10-CM | POA: Diagnosis not present

## 2021-05-07 DIAGNOSIS — R188 Other ascites: Secondary | ICD-10-CM | POA: Diagnosis not present

## 2021-05-07 DIAGNOSIS — N200 Calculus of kidney: Secondary | ICD-10-CM | POA: Diagnosis not present

## 2021-05-07 DIAGNOSIS — Z9049 Acquired absence of other specified parts of digestive tract: Secondary | ICD-10-CM | POA: Diagnosis not present

## 2021-05-08 ENCOUNTER — Encounter (HOSPITAL_BASED_OUTPATIENT_CLINIC_OR_DEPARTMENT_OTHER): Payer: Self-pay

## 2021-05-08 ENCOUNTER — Ambulatory Visit (HOSPITAL_BASED_OUTPATIENT_CLINIC_OR_DEPARTMENT_OTHER): Admit: 2021-05-08 | Payer: Medicare Other | Admitting: Gynecologic Oncology

## 2021-05-08 DIAGNOSIS — D071 Carcinoma in situ of vulva: Secondary | ICD-10-CM

## 2021-05-08 SURGERY — VULVECTOMY, PARTIAL
Anesthesia: General

## 2021-05-13 ENCOUNTER — Telehealth: Payer: Self-pay

## 2021-05-13 NOTE — Telephone Encounter (Signed)
Spoke with Destiny Villanueva this morning. Patient has not had anymore lab work since we last spoke on 05/02/21. She is planning on calling Dr. Woody Seller office at the beginning of next week and having her labs redrawn. She recently had an ultrasound but has not received the results yet. Patient states she will have the ultrasound report sent to our office. Patient is appreciative of our office calling and checking in with her. Instructed patient to call with any needs.

## 2021-05-17 ENCOUNTER — Encounter: Payer: Self-pay | Admitting: Gynecologic Oncology

## 2021-05-17 DIAGNOSIS — K7460 Unspecified cirrhosis of liver: Secondary | ICD-10-CM | POA: Diagnosis not present

## 2021-05-21 ENCOUNTER — Telehealth: Payer: Self-pay | Admitting: Gastroenterology

## 2021-05-21 NOTE — Telephone Encounter (Signed)
Patient seen in office recently by Dr. Gala Romney. She advised that PCP had planned for CT abd 05/08/21. She actually has had abdominal U/S. Findings indicated slightly irregular liver contour (new findings), known splenomegaly (20cm in greatest dimension), no ascites.   Last imaging test for comparison was in 2017 via CT: fatty liver noted and spleen enlarged at 17cm.  Findings concerning for at some cirrhosis. Of note, she has had splenomegaly chronically in the setting of unremarkable liver and no portal hypertension changes on EGD in 2021 so it may be that her splenomegaly is multifactorial.  We need to gather most recent labs from PCP to decide if any additional labs needed.

## 2021-05-22 ENCOUNTER — Encounter: Payer: Self-pay | Admitting: Gynecologic Oncology

## 2021-05-22 NOTE — Telephone Encounter (Signed)
Noted  

## 2021-05-22 NOTE — Telephone Encounter (Signed)
Labs dated May 17, 2021: White blood cell count 3400, hemoglobin 13.9, hematocrit 39.8, MCV 85, platelets 98,000, BUN 15, creatinine 0.85, albumin 4.5, total bilirubin 0.4, alkaline phosphatase 52, AST 33, ALT 21,  Previous viral markers negative for Hep B and C in 2018.   With next labs she needs the following labs to help [place] evaluate liver function, screen for iron overload, and see if she is immune to Hep A and B. Please arrange for AFP tumor marker, PT/INR, CMET, iron/tibc/ferritin, Hep B surface antibody, Hep A total antibody. Let's ask her to have completed in 06/2021.   FYI, Dr. Gala Romney.

## 2021-05-22 NOTE — Telephone Encounter (Signed)
Requested labs from PCP 

## 2021-05-23 ENCOUNTER — Other Ambulatory Visit: Payer: Self-pay

## 2021-05-23 DIAGNOSIS — R161 Splenomegaly, not elsewhere classified: Secondary | ICD-10-CM

## 2021-05-23 DIAGNOSIS — D696 Thrombocytopenia, unspecified: Secondary | ICD-10-CM

## 2021-05-23 DIAGNOSIS — R109 Unspecified abdominal pain: Secondary | ICD-10-CM

## 2021-05-23 NOTE — Telephone Encounter (Signed)
Pt was made aware and verbalized understanding. Labs were ordered and will be mailed to the patient closer to time to have done.

## 2021-05-24 ENCOUNTER — Telehealth: Payer: Self-pay

## 2021-05-24 NOTE — Telephone Encounter (Signed)
Spoke with Ms. Dewolfe this morning regarding surgery scheduling. Her procedure has been scheduled for 06/05/21 at the [facility]. The procedure is to start around 10:30 am. Advised that someone from the surgery center will be in contact with her for further instructions. This may be 24-28 hours prior to the surgery. Patient verbalized understanding. Patient states she does still have the oxycodone prescribed for after surgery, she has not taken any. She was instructed to get senokot and ibuprofen over the counter.  Instructed to call with any needs.

## 2021-05-28 ENCOUNTER — Other Ambulatory Visit: Payer: Self-pay

## 2021-05-28 ENCOUNTER — Other Ambulatory Visit: Payer: Self-pay | Admitting: *Deleted

## 2021-05-28 ENCOUNTER — Encounter (HOSPITAL_BASED_OUTPATIENT_CLINIC_OR_DEPARTMENT_OTHER): Payer: Self-pay | Admitting: Gynecologic Oncology

## 2021-05-28 DIAGNOSIS — R109 Unspecified abdominal pain: Secondary | ICD-10-CM

## 2021-05-28 DIAGNOSIS — D696 Thrombocytopenia, unspecified: Secondary | ICD-10-CM

## 2021-05-28 DIAGNOSIS — R161 Splenomegaly, not elsewhere classified: Secondary | ICD-10-CM

## 2021-05-28 NOTE — Progress Notes (Addendum)
Spoke w/ via phone for pre-op interview---Destiny Villanueva needs dos----   CBC, APTT, and PT/INR            Villanueva results------ COVID test -----patient states asymptomatic no test needed Arrive at -------0845 NPO after MN NO Solid Food.  Clear liquids from MN until---0745 Med rec completed Medications to take morning of surgery -----Celexa, Pepcid Flonase PRN and bring Albuterol inhaler Diabetic medication ----- Patient instructed no nail polish to be worn day of surgery Patient instructed to bring photo id and insurance card day of surgery Patient aware to have Driver (ride ) / caregiver Destiny Villanueva Husband   for 24 hours after surgery  Patient Special Instructions ----- Advised to bring CPAP but patient stated she stopped wearing it. Pre-Op special Istructions ----- Patient verbalized understanding of instructions that were given at this phone interview. Patient denies shortness of breath, chest pain, fever, cough at this phone interview.

## 2021-06-03 ENCOUNTER — Encounter: Payer: Self-pay | Admitting: Gynecologic Oncology

## 2021-06-04 ENCOUNTER — Telehealth: Payer: Self-pay

## 2021-06-04 NOTE — Telephone Encounter (Signed)
Telephone call to check on pre-operative status.  Patient compliant with pre-operative instructions. Surgery got moved up to start at 0830. Reinforced nothing to eat after midnight. Clear liquids until 0530. Patient to arrive at 0630.  No questions or concerns voiced.  Instructed to call for any needs.

## 2021-06-04 NOTE — Progress Notes (Signed)
Called patient to come in at 0630 clear liquids until 0530.

## 2021-06-04 NOTE — Discharge Instructions (Addendum)
AFTER SURGERY INSTRUCTIONS   We recommend purchasing several bags of frozen green peas and dividing them into ziploc bags. You will want to keep these in the freezer and have them ready to use as ice packs to the vulvar incision. Once the ice pack is no longer cold, you can get another from the freezer. The frozen peas mold to your body better than a regular ice pack.    Return to work:  1-2 weeks if applicable   Activity: 1. Be up and out of the bed during the day.  Take a nap if needed.  You may walk up steps but be careful and use the hand rail.  Stair climbing will tire you more than you think, you may need to stop part way and rest.    2. No lifting or straining for 2 weeks over 10 pounds. No pushing, pulling, straining for 2 weeks.   3. No driving for minimum 24 hours after surgery if you were cleared to drive before surgery.  Do not drive if you are taking narcotic pain medicine and make sure that your reaction time has returned.    4. You can shower as soon as the next day after surgery. Shower daily. No tub baths or submerging your body in water until cleared by your surgeon. If you have the soap that was given to you by pre-surgical testing that was used before surgery, you do not need to use it afterwards because this can irritate your incisions.    5. No sexual activity and nothing in the vagina for 4 weeks.   6. You may experience vulvar spotting and discharge after surgery.  The spotting is normal but if you experience heavy bleeding, call our office.   7. Take Tylenol (would limit use due to recent liver findings and not use if your PCP advised against Tylenol use) or ibuprofen (OR ALEVE, take with food.May have next dose today after 3pm  Using Nsaids (aleve, ibuprofen) while taking celexa can increase your risk of gastrointestional bleeding so use sparingly and take with food) first for pain and only use narcotic pain medication for severe pain not relieved by the Tylenol or  Ibuprofen.  Monitor your Tylenol intake to a max of 4,000 mg in a 24 hour period. You can alternate these medications after surgery.   Diet: 1. Low sodium Heart Healthy Diet is recommended but you are cleared to resume your normal (before surgery) diet after your procedure.   2. It is safe to use a laxative, such as Miralax or Colace, if you have difficulty moving your bowels. You have been prescribed Sennakot at bedtime every evening to keep bowel movements regular and to prevent constipation.     Wound Care: 1. Keep clean and dry.  Shower daily.   Reasons to call the Doctor: Fever - Oral temperature greater than 100.4 degrees Fahrenheit Foul-smelling vaginal discharge Difficulty urinating Nausea and vomiting Increased pain at the site of the incision that is unrelieved with pain medicine. Difficulty breathing with or without chest pain New calf pain especially if only on one side Sudden, continuing increased vaginal bleeding with or without clots.   Contacts: For questions or concerns you should contact:   Dr. Jeral Pinch at [phone]   [name], NP at [phone]   After Hours: call [phone] and have the GYN Oncologist paged/contacted (after 5 pm or on the weekends).   Messages sent via mychart are for non-urgent matters and are not responded to after  hours so for urgent needs, please call the after hours number.   Post Anesthesia Home Care Instructions  Activity: Get plenty of rest for the remainder of the day. A responsible adult should stay with you for 24 hours following the procedure.  For the next 24 hours, DO NOT: -Drive a car -Paediatric nurse -Drink alcoholic beverages -Take any medication unless instructed by your physician -Make any legal decisions or sign important papers.  Meals: Start with liquid foods such as gelatin or soup. Progress to regular foods as tolerated. Avoid greasy, spicy, heavy foods. If nausea and/or vomiting occur, drink  only clear liquids until the nausea and/or vomiting subsides. Call your physician if vomiting continues.  Special Instructions/Symptoms: Your throat may feel dry or sore from the anesthesia or the breathing tube placed in your throat during surgery. If this causes discomfort, gargle with warm salt water. The discomfort should disappear within 24 hours.  If you had a scopolamine patch placed behind your ear for the management of post- operative nausea and/or vomiting:  1. The medication in the patch is effective for 72 hours, after which it should be removed.  Wrap patch in a tissue and discard in the trash. Wash hands thoroughly with soap and water. 2. You may remove the patch earlier than 72 hours if you experience unpleasant side effects which may include dry mouth, dizziness or visual disturbances. 3. Avoid touching the patch. Wash your hands with soap and water after contact with the patch.

## 2021-06-04 NOTE — Anesthesia Preprocedure Evaluation (Addendum)
Anesthesia Evaluation  Patient identified by MRN, date of birth, ID band Patient awake    Reviewed: Allergy & Precautions, NPO status , Patient's Chart, lab work & pertinent test results  History of Anesthesia Complications (+) PONV  Airway Mallampati: I  TM Distance: >3 FB Neck ROM: Full    Dental  (+) Edentulous Upper, Edentulous Lower   Pulmonary sleep apnea (does not use CPAP) , COPD,  COPD inhaler, Current SmokerPatient did not abstain from smoking.,    breath sounds clear to auscultation       Cardiovascular (-) angina(-) CAD  Rhythm:Regular Rate:Normal  '19 ECHO: EF 55-60%, normal LVF with no significant valvular abnormalities   Neuro/Psych Anxiety Depression Chronic back pain    GI/Hepatic GERD  Medicated,(+) Cirrhosis       ,   Endo/Other  negative endocrine ROS  Renal/GU negative Renal ROS     Musculoskeletal  (+) Arthritis ,   Abdominal   Peds  Hematology negative hematology ROS (+)   Anesthesia Other Findings   Reproductive/Obstetrics                            Anesthesia Physical Anesthesia Plan  ASA: 3  Anesthesia Plan: General   Post-op Pain Management: Toradol IV (intra-op)*   Induction: Intravenous  PONV Risk Score and Plan: 2 and Ondansetron, Dexamethasone, Scopolamine patch - Pre-op and Treatment may vary due to age or medical condition  Airway Management Planned: LMA  Additional Equipment: None  Intra-op Plan:   Post-operative Plan:   Informed Consent: I have reviewed the patients History and Physical, chart, labs and discussed the procedure including the risks, benefits and alternatives for the proposed anesthesia with the patient or authorized representative who has indicated his/her understanding and acceptance.       Plan Discussed with: CRNA and Surgeon  Anesthesia Plan Comments:        Anesthesia Quick Evaluation

## 2021-06-05 ENCOUNTER — Ambulatory Visit (HOSPITAL_BASED_OUTPATIENT_CLINIC_OR_DEPARTMENT_OTHER): Payer: Medicare Other | Admitting: Anesthesiology

## 2021-06-05 ENCOUNTER — Other Ambulatory Visit: Payer: Self-pay

## 2021-06-05 ENCOUNTER — Ambulatory Visit (HOSPITAL_BASED_OUTPATIENT_CLINIC_OR_DEPARTMENT_OTHER)
Admission: RE | Admit: 2021-06-05 | Discharge: 2021-06-05 | Disposition: A | Discharge status: Home / Self Care | Payer: Medicare Other | Attending: Gynecologic Oncology | Admitting: Gynecologic Oncology

## 2021-06-05 ENCOUNTER — Encounter (HOSPITAL_BASED_OUTPATIENT_CLINIC_OR_DEPARTMENT_OTHER): Payer: Self-pay | Admitting: Gynecologic Oncology

## 2021-06-05 ENCOUNTER — Encounter (HOSPITAL_BASED_OUTPATIENT_CLINIC_OR_DEPARTMENT_OTHER)
Admission: RE | Disposition: A | Discharge status: Home / Self Care | Payer: Self-pay | Source: Home / Self Care | Attending: Gynecologic Oncology

## 2021-06-05 ENCOUNTER — Ambulatory Visit: Payer: Medicare Other | Admitting: Obstetrics and Gynecology

## 2021-06-05 DIAGNOSIS — G4730 Sleep apnea, unspecified: Secondary | ICD-10-CM | POA: Diagnosis not present

## 2021-06-05 DIAGNOSIS — D071 Carcinoma in situ of vulva: Secondary | ICD-10-CM | POA: Diagnosis not present

## 2021-06-05 DIAGNOSIS — Z9189 Other specified personal risk factors, not elsewhere classified: Secondary | ICD-10-CM | POA: Diagnosis not present

## 2021-06-05 DIAGNOSIS — K219 Gastro-esophageal reflux disease without esophagitis: Secondary | ICD-10-CM | POA: Diagnosis not present

## 2021-06-05 DIAGNOSIS — D6959 Other secondary thrombocytopenia: Secondary | ICD-10-CM | POA: Insufficient documentation

## 2021-06-05 DIAGNOSIS — Z9889 Other specified postprocedural states: Secondary | ICD-10-CM | POA: Diagnosis not present

## 2021-06-05 DIAGNOSIS — I2510 Atherosclerotic heart disease of native coronary artery without angina pectoris: Secondary | ICD-10-CM

## 2021-06-05 DIAGNOSIS — D72819 Decreased white blood cell count, unspecified: Secondary | ICD-10-CM | POA: Insufficient documentation

## 2021-06-05 DIAGNOSIS — F418 Other specified anxiety disorders: Secondary | ICD-10-CM

## 2021-06-05 DIAGNOSIS — J449 Chronic obstructive pulmonary disease, unspecified: Secondary | ICD-10-CM | POA: Insufficient documentation

## 2021-06-05 DIAGNOSIS — K745 Biliary cirrhosis, unspecified: Secondary | ICD-10-CM | POA: Diagnosis not present

## 2021-06-05 DIAGNOSIS — F17210 Nicotine dependence, cigarettes, uncomplicated: Secondary | ICD-10-CM | POA: Insufficient documentation

## 2021-06-05 HISTORY — PX: VULVECTOMY PARTIAL: SHX6187

## 2021-06-05 HISTORY — DX: Other specified postprocedural states: Z98.890

## 2021-06-05 HISTORY — DX: Other specified postprocedural states: R11.2

## 2021-06-05 LAB — CBC
HCT: 40.1 % (ref 36.0–46.0)
Hemoglobin: 13.1 g/dL (ref 12.0–15.0)
MCH: 27.6 pg (ref 26.0–34.0)
MCHC: 32.7 g/dL (ref 30.0–36.0)
MCV: 84.6 fL (ref 80.0–100.0)
Platelets: 95 10*3/uL — ABNORMAL LOW (ref 150–400)
RBC: 4.74 MIL/uL (ref 3.87–5.11)
RDW: 13.6 % (ref 11.5–15.5)
WBC: 3.8 10*3/uL — ABNORMAL LOW (ref 4.0–10.5)
nRBC: 0 % (ref 0.0–0.2)

## 2021-06-05 LAB — PROTIME-INR
INR: 1.1 (ref 0.8–1.2)
Prothrombin Time: 13.7 seconds (ref 11.4–15.2)

## 2021-06-05 LAB — APTT: aPTT: 38 seconds — ABNORMAL HIGH (ref 24–36)

## 2021-06-05 SURGERY — VULVECTOMY, PARTIAL
Anesthesia: General | Site: Vulva

## 2021-06-05 MED ORDER — DEXAMETHASONE SODIUM PHOSPHATE 10 MG/ML IJ SOLN: 4.0000 mg | INTRAMUSCULAR | Status: DC

## 2021-06-05 MED ORDER — 0.9 % SODIUM CHLORIDE (POUR BTL) OPTIME
TOPICAL | Status: DC | PRN
  Administered 2021-06-05: 500 mL

## 2021-06-05 MED ORDER — ONDANSETRON HCL 4 MG PO TABS: 4.0000 mg | ORAL_TABLET | Freq: Four times a day (QID) | ORAL | Status: DC | PRN

## 2021-06-05 MED ORDER — ONDANSETRON HCL 4 MG/2ML IJ SOLN
INTRAMUSCULAR | Status: AC
  Filled 2021-06-05: qty 2

## 2021-06-05 MED ORDER — PROPOFOL 10 MG/ML IV BOLUS
INTRAVENOUS | Status: AC
  Filled 2021-06-05: qty 20

## 2021-06-05 MED ORDER — OXYCODONE HCL 5 MG PO TABS: 5.0000 mg | ORAL_TABLET | ORAL | Status: DC | PRN

## 2021-06-05 MED ORDER — SCOPOLAMINE 1 MG/3DAYS TD PT72
1.0000 | MEDICATED_PATCH | TRANSDERMAL | Status: DC
  Administered 2021-06-05: 1.5 mg via TRANSDERMAL

## 2021-06-05 MED ORDER — SCOPOLAMINE 1 MG/3DAYS TD PT72
MEDICATED_PATCH | TRANSDERMAL | Status: AC
  Filled 2021-06-05: qty 1

## 2021-06-05 MED ORDER — MIDAZOLAM HCL 2 MG/2ML IJ SOLN: 0.5000 mg | Freq: Once | INTRAMUSCULAR | Status: DC | PRN

## 2021-06-05 MED ORDER — PHENYLEPHRINE 40 MCG/ML (10ML) SYRINGE FOR IV PUSH (FOR BLOOD PRESSURE SUPPORT)
PREFILLED_SYRINGE | INTRAVENOUS | Status: DC | PRN
  Administered 2021-06-05 (×5): 80 ug via INTRAVENOUS

## 2021-06-05 MED ORDER — ACETAMINOPHEN 500 MG PO TABS: 1000.0000 mg | ORAL_TABLET | Freq: Once | ORAL | Status: DC

## 2021-06-05 MED ORDER — EPHEDRINE SULFATE-NACL 50-0.9 MG/10ML-% IV SOSY
PREFILLED_SYRINGE | INTRAVENOUS | Status: DC | PRN
  Administered 2021-06-05: 10 mg via INTRAVENOUS

## 2021-06-05 MED ORDER — OXYCODONE HCL 5 MG PO TABS: 5.0000 mg | ORAL_TABLET | Freq: Once | ORAL | Status: DC | PRN

## 2021-06-05 MED ORDER — TRAMADOL HCL 50 MG PO TABS: 50.0000 mg | ORAL_TABLET | Freq: Four times a day (QID) | ORAL | Status: DC | PRN

## 2021-06-05 MED ORDER — PROPOFOL 10 MG/ML IV BOLUS
INTRAVENOUS | Status: DC | PRN
Start: 2021-06-05 — End: 2021-06-05
  Administered 2021-06-05: 15 mg via INTRAVENOUS

## 2021-06-05 MED ORDER — HYDROMORPHONE HCL 1 MG/ML IJ SOLN: 0.2500 mg | INTRAMUSCULAR | Status: DC | PRN

## 2021-06-05 MED ORDER — LACTATED RINGERS IV SOLN
INTRAVENOUS | Status: DC
  Administered 2021-06-05: 1000 mL via INTRAVENOUS

## 2021-06-05 MED ORDER — HEPARIN SODIUM (PORCINE) 5000 UNIT/ML IJ SOLN
5000.0000 [IU] | INTRAMUSCULAR | Status: AC
  Administered 2021-06-05: 5000 [IU] via SUBCUTANEOUS

## 2021-06-05 MED ORDER — MIDAZOLAM HCL 5 MG/5ML IJ SOLN
INTRAMUSCULAR | Status: DC | PRN
  Administered 2021-06-05: 2 mg via INTRAVENOUS

## 2021-06-05 MED ORDER — FENTANYL CITRATE (PF) 100 MCG/2ML IJ SOLN
INTRAMUSCULAR | Status: DC | PRN
  Administered 2021-06-05 (×2): 50 ug via INTRAVENOUS

## 2021-06-05 MED ORDER — DEXAMETHASONE SODIUM PHOSPHATE 10 MG/ML IJ SOLN
INTRAMUSCULAR | Status: AC
  Filled 2021-06-05: qty 1

## 2021-06-05 MED ORDER — OXYCODONE HCL 5 MG/5ML PO SOLN: 5.0000 mg | Freq: Once | ORAL | Status: DC | PRN

## 2021-06-05 MED ORDER — ACETIC ACID 5 % SOLN
Status: DC | PRN
  Administered 2021-06-05: 1 via TOPICAL

## 2021-06-05 MED ORDER — BUPIVACAINE HCL 0.25 % IJ SOLN
INTRAMUSCULAR | Status: DC | PRN
  Administered 2021-06-05: 20 mL

## 2021-06-05 MED ORDER — EPHEDRINE 5 MG/ML INJ
INTRAVENOUS | Status: AC
  Filled 2021-06-05: qty 5

## 2021-06-05 MED ORDER — KETOROLAC TROMETHAMINE 30 MG/ML IJ SOLN
INTRAMUSCULAR | Status: DC | PRN
  Administered 2021-06-05: 30 mg via INTRAVENOUS

## 2021-06-05 MED ORDER — HEPARIN SODIUM (PORCINE) 5000 UNIT/ML IJ SOLN
INTRAMUSCULAR | Status: AC
  Filled 2021-06-05: qty 1

## 2021-06-05 MED ORDER — PHENYLEPHRINE 40 MCG/ML (10ML) SYRINGE FOR IV PUSH (FOR BLOOD PRESSURE SUPPORT)
PREFILLED_SYRINGE | INTRAVENOUS | Status: AC
  Filled 2021-06-05: qty 10

## 2021-06-05 MED ORDER — DEXAMETHASONE SODIUM PHOSPHATE 10 MG/ML IJ SOLN
INTRAMUSCULAR | Status: DC | PRN
Start: 2021-06-05 — End: 2021-06-05
  Administered 2021-06-05: 10 mg via INTRAVENOUS

## 2021-06-05 MED ORDER — ONDANSETRON HCL 4 MG/2ML IJ SOLN
INTRAMUSCULAR | Status: DC | PRN
  Administered 2021-06-05: 4 mg via INTRAVENOUS

## 2021-06-05 MED ORDER — FENTANYL CITRATE (PF) 100 MCG/2ML IJ SOLN
INTRAMUSCULAR | Status: AC
  Filled 2021-06-05: qty 2

## 2021-06-05 MED ORDER — PROMETHAZINE HCL 25 MG/ML IJ SOLN: 6.2500 mg | INTRAMUSCULAR | Status: DC | PRN

## 2021-06-05 MED ORDER — ONDANSETRON HCL 4 MG/2ML IJ SOLN: 4.0000 mg | Freq: Four times a day (QID) | INTRAMUSCULAR | Status: DC | PRN

## 2021-06-05 MED ORDER — LIDOCAINE 2% (20 MG/ML) 5 ML SYRINGE
INTRAMUSCULAR | Status: DC | PRN
  Administered 2021-06-05: 20 mg via INTRAVENOUS
  Administered 2021-06-05: 60 mg via INTRAVENOUS

## 2021-06-05 MED ORDER — MEPERIDINE HCL 25 MG/ML IJ SOLN: 6.2500 mg | INTRAMUSCULAR | Status: DC | PRN

## 2021-06-05 MED ORDER — KETOROLAC TROMETHAMINE 30 MG/ML IJ SOLN
INTRAMUSCULAR | Status: AC
  Filled 2021-06-05: qty 1

## 2021-06-05 MED ORDER — MIDAZOLAM HCL 2 MG/2ML IJ SOLN
INTRAMUSCULAR | Status: AC
  Filled 2021-06-05: qty 2

## 2021-06-05 SURGICAL SUPPLY — 28 items
BLADE CLIPPER SENSICLIP SURGIC (BLADE) IMPLANT
BLADE SURG 15 STRL LF DISP TIS (BLADE) ×1 IMPLANT
BLADE SURG 15 STRL SS (BLADE) ×2
CATH ROBINSON RED A/P 16FR (CATHETERS) ×2 IMPLANT
GAUZE 4X4 16PLY ~~LOC~~+RFID DBL (SPONGE) ×4 IMPLANT
GLOVE SURG ENC MOIS LTX SZ6 (GLOVE) ×4 IMPLANT
GOWN STRL REUS W/TWL LRG LVL3 (GOWN DISPOSABLE) ×2 IMPLANT
KIT TURNOVER CYSTO (KITS) ×2 IMPLANT
NDL HYPO 25X1 1.5 SAFETY (NEEDLE) ×1 IMPLANT
NEEDLE HYPO 25X1 1.5 SAFETY (NEEDLE) ×2 IMPLANT
NS IRRIG 500ML POUR BTL (IV SOLUTION) ×2 IMPLANT
PACK PERINEAL COLD (PAD) ×2 IMPLANT
PACK VAGINAL WOMENS (CUSTOM PROCEDURE TRAY) ×2 IMPLANT
PENCIL BUTTON HOLSTER BLD 10FT (ELECTRODE) ×2 IMPLANT
PUNCH BIOPSY DERMAL 3 (INSTRUMENTS) IMPLANT
PUNCH BIOPSY DERMAL 3MM (INSTRUMENTS)
PUNCH BIOPSY DERMAL 4MM (INSTRUMENTS) IMPLANT
SUT VIC AB 0 SH 27 (SUTURE) ×2 IMPLANT
SUT VIC AB 2-0 CT2 27 (SUTURE) IMPLANT
SUT VIC AB 2-0 SH 27 (SUTURE) ×2
SUT VIC AB 2-0 SH 27XBRD (SUTURE) IMPLANT
SUT VIC AB 3-0 SH 27 (SUTURE) ×2
SUT VIC AB 3-0 SH 27X BRD (SUTURE) ×1 IMPLANT
SUT VIC AB 4-0 PS2 18 (SUTURE) ×2 IMPLANT
SWAB OB GYN 8IN STERILE 2PK (MISCELLANEOUS) IMPLANT
SYR BULB IRRIG 60ML STRL (SYRINGE) ×2 IMPLANT
TOWEL OR 17X26 10 PK STRL BLUE (TOWEL DISPOSABLE) ×2 IMPLANT
WATER STERILE IRR 500ML POUR (IV SOLUTION) IMPLANT

## 2021-06-05 NOTE — Op Note (Signed)
PATIENT: Destiny Villanueva DATE: 06/05/21  Preop Diagnosis: VIN3  Postoperative Diagnosis: same as above  Surgery: Partial simple right posterior vulvectomy  Surgeons:  Valarie Cones, MD Assistant: [name], NP  Estimated blood loss: 25 ml  IVF:  see I&O flowsheet   Urine output: n/a   Complications: None apparent  Pathology: right posterior vulva (6-8 o'clock) with red pin on cork board at 12 o'clock  Operative findings: 1 x 1 cm area of acetowhite at 7 o'clock on posterior right vulva  Procedure: The patient was identified in the preoperative holding area. Informed consent was signed on the chart. Patient was seen history was reviewed and exam was performed.   The patient was then taken to the operating room and placed in the supine position with SCD hose on. General anesthesia was then induced without difficulty. She was then placed in the dorsolithotomy position. The perineum was prepped with Betadine. The vagina was prepped with Betadine. The patient was then draped after the prep was dried.   Timeout was performed the patient, procedure, antibiotic, allergy, and length of procedure. 5% acetic acid solution was applied to the perineum. The vulvar tissues were inspected for areas of acetowhite changes or leukoplakia. The lesion was identified and the marking pen was used to circumscribe the area with appropriate surgical margins. The subcuticular tissues were infiltrated with 1% lidocaine. The 15 blade scalpel was used to make an incision through the skin circumferentially as marked. The skin elipse was grasped and was separated from the underlying deep dermal tissues with the bovie device. After the specimen had been completely resected, it was oriented and marked with a red push pin at 12 o'clock on the cork board. The bovie was used to obtain hemostasis at the surgical bed. The subcutaneous tissues were irrigated and made hemostatic.   The deep dermal layer was  approximated with several 2-0 Vicryl mattress sutures. The more superficial dermal layers were approximated with 3-0 vicryl mattress sutures to bring the skin edges into approximation and off tension. The wound was closed following langher's lines. The cutaneous layer was closed with interrupted 4-0 vicryl stitches and mattress sutures to ensure a tension free and hemostatic closure. The perineum was again irrigated.   All instrument, suture, laparotomy, Ray-Tec, and needle counts were correct x2. The patient tolerated the procedure well and was taken recovery room in stable condition.   Jeral Pinch MD Gynecologic Oncology

## 2021-06-05 NOTE — Anesthesia Procedure Notes (Signed)
Procedure Name: LMA Insertion Date/Time: 06/05/2021 8:29 AM Performed by: Rogers Blocker, CRNA Pre-anesthesia Checklist: Patient identified, Emergency Drugs available, Suction available and Patient being monitored Patient Re-evaluated:Patient Re-evaluated prior to induction Oxygen Delivery Method: Circle System Utilized Preoxygenation: Pre-oxygenation with 100% oxygen Induction Type: IV induction Ventilation: Mask ventilation without difficulty LMA: LMA inserted LMA Size: 4.0 Number of attempts: 1 Placement Confirmation: positive ETCO2 Tube secured with: Tape Dental Injury: Teeth and Oropharynx as per pre-operative assessment

## 2021-06-05 NOTE — Transfer of Care (Signed)
Immediate Anesthesia Transfer of Care Note  Patient: Destiny Villanueva  Procedure(s) Performed: VULVECTOMY PARTIAL (Vulva)  Patient Location: PACU  Anesthesia Type:General  Level of Consciousness: awake, alert , oriented and patient cooperative  Airway & Oxygen Therapy: Patient Spontanous Breathing and Patient connected to face mask oxygen  Post-op Assessment: Report given to RN and Post -op Vital signs reviewed and stable  Post vital signs: Reviewed and stable  Last Vitals:  Vitals Value Taken Time  BP 130/84 06/05/21 0918  Temp    Pulse 116 06/05/21 0921  Resp 12 06/05/21 0921  SpO2 99 % 06/05/21 0921  Vitals shown include unvalidated device data.  Last Pain:  Vitals:   06/05/21 0723  TempSrc: Oral  PainSc: 0-No pain      Patients Stated Pain Goal: 4 (63/81/77 1165)  Complications: No notable events documented.
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
## 2021-06-05 NOTE — Anesthesia Postprocedure Evaluation (Signed)
Anesthesia Post Note  Patient: Destiny Villanueva  Procedure(s) Performed: VULVECTOMY PARTIAL (Vulva)     Patient location during evaluation: Phase II Anesthesia Type: General Level of consciousness: awake and alert, patient cooperative and oriented Pain management: pain level controlled Vital Signs Assessment: post-procedure vital signs reviewed and stable Respiratory status: spontaneous breathing, nonlabored ventilation and respiratory function stable Cardiovascular status: blood pressure returned to baseline and stable Postop Assessment: no apparent nausea or vomiting, adequate PO intake and able to ambulate Anesthetic complications: no   No notable events documented.  Last Vitals:  Vitals:   06/05/21 0945 06/05/21 1015  BP: 110/68 117/72  Pulse: 98 88  Resp: 15 16  Temp:  36.7 C  SpO2: 95% 98%    Last Pain:  Vitals:   06/05/21 1015  TempSrc:   PainSc: 0-No pain                 Regan Mcbryar,E. Sharee Sturdy

## 2021-06-06 ENCOUNTER — Encounter (HOSPITAL_BASED_OUTPATIENT_CLINIC_OR_DEPARTMENT_OTHER): Payer: Self-pay | Admitting: Gynecologic Oncology

## 2021-06-06 ENCOUNTER — Telehealth: Payer: Self-pay

## 2021-06-06 LAB — SURGICAL PATHOLOGY

## 2021-06-06 NOTE — Telephone Encounter (Signed)
Attempted to reach patient to check in with her post-operatively. Unable to reach patient. Left message requesting return call.   

## 2021-06-06 NOTE — Telephone Encounter (Signed)
Spoke with Ms. Marsolek this morning. She states she is eating, drinking and urinating well. She has not had a BM yet but is passing gas. "If I don't have a bowel movement by tonight I will take a stool softener." Encouraged her to drink plenty of water and be up and moving throughout the day. She denies fever or chills.  She rates her pain 3/10. She does not feel like she needs to take any pain medicine as pain is manageable for her. " It really only hurts when I'm sitting down." Advised patient to try not to sit directly on incision and to shift her weight away from the area. Patient verbalized understanding and is going to try a different pillow or blanket to sit on.   Instructed to call office with any fever, chills, purulent drainage, uncontrolled pain or any other questions or concerns. Patient verbalizes understanding.   Pt aware of post op appointments as well as the office number [phone] and after hours number [phone] to call if she has any questions or concerns

## 2021-06-10 ENCOUNTER — Telehealth: Payer: Self-pay | Admitting: *Deleted

## 2021-06-10 NOTE — Telephone Encounter (Signed)
Returned pt's phone call this morning regarding her symptoms of: burning, discharge, and foul odor from her incision site post surgery. Reviewed that some burning is to be expected at incision it and to use peri bottle provided, rinse well and pat dry after using use. Pt stated she is having yellow and dark blood tinged discharge on her incontinence brief. The discharge is not soaking through the brief but it's enough that she has to change it every hour. Pt denies any heavy lifting or exercise since the surgery. States she is eating and drinking well. Stated a pain of 4/10. She is taking Aleve for pain twice a day and using ice. Denies fever. [name], NP made aware. Pt has an appointment with [name], NP tomorrow Tuesday 06/11/21 at 10:15am. Pt instructed to call with any additional needs.

## 2021-06-11 ENCOUNTER — Telehealth: Payer: Self-pay

## 2021-06-11 ENCOUNTER — Encounter: Payer: Self-pay | Admitting: Gynecologic Oncology

## 2021-06-11 ENCOUNTER — Inpatient Hospital Stay: Payer: Medicare Other | Attending: Gynecologic Oncology | Admitting: Gynecologic Oncology

## 2021-06-11 ENCOUNTER — Other Ambulatory Visit: Payer: Self-pay

## 2021-06-11 VITALS — BP 113/65 | HR 95 | Temp 98.4°F | Resp 14 | Ht 65.35 in | Wt 171.8 lb

## 2021-06-11 DIAGNOSIS — Z9079 Acquired absence of other genital organ(s): Secondary | ICD-10-CM

## 2021-06-11 DIAGNOSIS — D071 Carcinoma in situ of vulva: Secondary | ICD-10-CM

## 2021-06-11 DIAGNOSIS — T8131XA Disruption of external operation (surgical) wound, not elsewhere classified, initial encounter: Secondary | ICD-10-CM

## 2021-06-11 MED ORDER — LIDOCAINE 5 % EX OINT: 1.0000 "application " | TOPICAL_OINTMENT | CUTANEOUS | 2 refills | Status: DC | PRN

## 2021-06-11 NOTE — Telephone Encounter (Signed)
Spoke with Destiny Villanueva regarding her lidocaine prescription. [facility] does not have any gels or creams in stock. They do have an ointment that is $27.16. Patient verbalizes understanding and is ok with the ointment. Instructed to call with any needs.

## 2021-06-11 NOTE — Patient Instructions (Signed)
Your incision has opened which happens often. It will heal from the inside out. Plan to do the sitz bath two to three times a day to help cleanse the area and to assist with healing. After this, you can use a hair dryer on the cool setting to dry the area thoroughly. We will also send in lidocaine gel/ointment you can apply to the area to help with discomfort.  Plan to begin spraying the area with the peri bottle right at the same time you start urinating to rinse the urine off quickly to help with burning symptoms.   If you are at home and feel that things are not improving, or develop new symptoms such as increased pain, fever, redness in the area around the incision, please call the office at [phone] to be seen. If you are doing well, plan to follow up as scheduled with Dr. Berline Lopes.

## 2021-06-12 NOTE — Progress Notes (Signed)
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 She was initially scheduled for surgery on 05/08/2021 but this was delayed due to the need for further work-up of leukopenia and thrombocytopenia noted on blood work from April 30, 2021 with her primary care Dr. Woody Seller.  She underwent an abdominal ultrasound on May 07, 2021 at a [facility] facility noting cirrhosis of the liver, no hepatic lesions, splenomegaly, right renal calculus, and no ascites.  She had several occasions of repeat lab work during this time.  There was improvement in her white blood cell count and platelet count so the patient was able to be scheduled for surgery.  On the day of her surgery (06/05/2021), her WBC count was at 3.8, PLT at 95, aPTT at 38, PT 13.7, and INR 1.1. On June 05, 2021, the patient underwent a partial simple right posterior vulvectomy with Dr. Jeral Pinch. 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 Her post-operative course was uneventful until the develop of increasing vulvar pain, drainage starting on Saturday, June 08, 2021.  Interval HPI: She presents to the office today for evaluation of burning, increased discomfort, discharge with a foul odor from her vulvar incision site. She states she had been doing pretty well at home until a change on Saturday.  She noticed increased discomfort around the vulvar incision and significant burning.  She was having a discharge described as yellow and dark blood-tinged.    Overall she has been doing well.  She has been tolerating her diet with no nausea or vomiting.  She denies fever or chills.  She continues to use Aleve twice a day for the pain and uses ice.  She continues to use the peribottle with toileting.  She states her bowels have been working without issue along with her bladder.  No significant bleeding  reported.  Exam: Alert, oriented x 3, in no acute distress. Lungs clear. Heart regular in rate and rhythm. On pelvic examination, the right vulvar incision has opened.  No surrounding erythema appreciated.  There is fibrinous drainage present.  No findings concerning for infection. No lower extremity edema noted.  Assessment/plan: Destiny Villanueva is a 61 year old female status post partial right posterior vulvectomy on June 05, 2021 with Dr. Jeral Pinch.  On office examination today, her vulvar incision has opened with no signs of infection.  Vulvar care reviewed.  The patient was given a sitz bath to perform at least 2-3 times a day.  She has also been prescribed lidocaine ointment to apply to the area for discomfort as needed.  We discussed the use of the peribottle.  Reportable signs and symptoms reviewed and she is advised to call for any questions or concerns.  She is advised to follow-up as planned or sooner if needed.  Our office will contact her later this week to check in to see how she is doing.  All questions answered.

## 2021-06-13 ENCOUNTER — Telehealth: Payer: Self-pay

## 2021-06-13 DIAGNOSIS — I700 Atherosclerosis of aorta: Secondary | ICD-10-CM | POA: Diagnosis not present

## 2021-06-13 DIAGNOSIS — Z299 Encounter for prophylactic measures, unspecified: Secondary | ICD-10-CM | POA: Diagnosis not present

## 2021-06-13 DIAGNOSIS — J069 Acute upper respiratory infection, unspecified: Secondary | ICD-10-CM | POA: Diagnosis not present

## 2021-06-13 DIAGNOSIS — F17210 Nicotine dependence, cigarettes, uncomplicated: Secondary | ICD-10-CM | POA: Diagnosis not present

## 2021-06-13 DIAGNOSIS — J029 Acute pharyngitis, unspecified: Secondary | ICD-10-CM | POA: Diagnosis not present

## 2021-06-13 DIAGNOSIS — K7460 Unspecified cirrhosis of liver: Secondary | ICD-10-CM | POA: Diagnosis not present

## 2021-06-13 NOTE — Telephone Encounter (Signed)
Checking in with Ms. Fontenette after her recent office visit. She reports she is a little bit better, the burning is not as intense as it was. Discharge is about the same as before. She rates her pain as 3-4/10. She has been using the lidocaine ointment, aleve and ice packs for discomfort which has been helping. Patient denies any fever, chills or any news symptoms. [name], NP notified.    Encouraged patient to continue using her peri-bottle, lidocaine ointment, aleve and ice packs and to notify our office of any new or worsening symptoms. Patient verbalized understanding.

## 2021-07-01 ENCOUNTER — Other Ambulatory Visit: Payer: Self-pay

## 2021-07-01 ENCOUNTER — Inpatient Hospital Stay: Payer: Medicare Other | Attending: Gynecologic Oncology | Admitting: Gynecologic Oncology

## 2021-07-01 ENCOUNTER — Encounter: Payer: Self-pay | Admitting: Gynecologic Oncology

## 2021-07-01 VITALS — BP 117/74 | HR 95 | Temp 98.4°F | Resp 14 | Ht 65.35 in | Wt 173.8 lb

## 2021-07-01 DIAGNOSIS — Z7189 Other specified counseling: Secondary | ICD-10-CM

## 2021-07-01 DIAGNOSIS — T8130XA Disruption of wound, unspecified, initial encounter: Secondary | ICD-10-CM

## 2021-07-01 DIAGNOSIS — Z9079 Acquired absence of other genital organ(s): Secondary | ICD-10-CM

## 2021-07-01 DIAGNOSIS — D071 Carcinoma in situ of vulva: Secondary | ICD-10-CM

## 2021-07-01 DIAGNOSIS — T8130XS Disruption of wound, unspecified, sequela: Secondary | ICD-10-CM
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
## 2021-07-01 NOTE — Patient Instructions (Addendum)
It was good to see you today.  You are healing well from surgery.  Remember, no heavy lifting for a couple more weeks. ? ?I will see you back in 6 weeks for 1 more incision check. ? ?Given risk of recurrence of the precancer on your vulva, I recommend continued close follow-up with your OB/GYN.  This should be visits every 6 months for the next 5 years and then yearly visits.  If you were to have new symptoms, see, or feel a new spot, it would be important for you to be seen sooner than that for evaluation. ?

## 2021-07-09 DIAGNOSIS — N390 Urinary tract infection, site not specified: Secondary | ICD-10-CM | POA: Diagnosis not present

## 2021-07-09 DIAGNOSIS — R350 Frequency of micturition: Secondary | ICD-10-CM | POA: Diagnosis not present

## 2021-07-09 DIAGNOSIS — J449 Chronic obstructive pulmonary disease, unspecified: Secondary | ICD-10-CM | POA: Diagnosis not present

## 2021-07-09 DIAGNOSIS — F17210 Nicotine dependence, cigarettes, uncomplicated: Secondary | ICD-10-CM | POA: Diagnosis not present

## 2021-07-09 DIAGNOSIS — Z299 Encounter for prophylactic measures, unspecified: Secondary | ICD-10-CM | POA: Diagnosis not present

## 2021-07-15 ENCOUNTER — Telehealth: Payer: Self-pay | Admitting: *Deleted

## 2021-07-15 DIAGNOSIS — R161 Splenomegaly, not elsewhere classified: Secondary | ICD-10-CM | POA: Diagnosis not present

## 2021-07-15 DIAGNOSIS — K7460 Unspecified cirrhosis of liver: Secondary | ICD-10-CM | POA: Diagnosis not present

## 2021-07-15 DIAGNOSIS — R109 Unspecified abdominal pain: Secondary | ICD-10-CM | POA: Diagnosis not present

## 2021-07-15 DIAGNOSIS — D696 Thrombocytopenia, unspecified: Secondary | ICD-10-CM | POA: Diagnosis not present

## 2021-07-15 NOTE — Telephone Encounter (Signed)
Returned the patient call and  rescheduled her appt from 4/24 to 5/4 ?

## 2021-07-16 LAB — COMPREHENSIVE METABOLIC PANEL
AG Ratio: 2.4 (calc) (ref 1.0–2.5)
ALT: 19 U/L (ref 6–29)
AST: 32 U/L (ref 10–35)
Albumin: 4.5 g/dL (ref 3.6–5.1)
Alkaline phosphatase (APISO): 47 U/L (ref 37–153)
BUN: 9 mg/dL (ref 7–25)
CO2: 29 mmol/L (ref 20–32)
Calcium: 9.6 mg/dL (ref 8.6–10.4)
Chloride: 105 mmol/L (ref 98–110)
Creat: 0.73 mg/dL (ref 0.50–1.05)
Globulin: 1.9 g/dL (calc) (ref 1.9–3.7)
Glucose, Bld: 102 mg/dL — ABNORMAL HIGH (ref 65–99)
Potassium: 4.8 mmol/L (ref 3.5–5.3)
Sodium: 140 mmol/L (ref 135–146)
Total Bilirubin: 0.4 mg/dL (ref 0.2–1.2)
Total Protein: 6.4 g/dL (ref 6.1–8.1)

## 2021-07-16 LAB — IRON,TIBC AND FERRITIN PANEL
%SAT: 13 % (calc) — ABNORMAL LOW (ref 16–45)
Ferritin: 33 ng/mL (ref 16–232)
Iron: 48 ug/dL (ref 45–160)
TIBC: 380 mcg/dL (calc) (ref 250–450)

## 2021-07-16 LAB — AFP TUMOR MARKER: AFP-Tumor Marker: 8.2 ng/mL — ABNORMAL HIGH

## 2021-07-16 LAB — PROTIME-INR
INR: 1
Prothrombin Time: 9.8 s (ref 9.0–11.5)

## 2021-07-16 LAB — HEPATITIS B SURFACE ANTIBODY,QUALITATIVE: Hep B S Ab: NONREACTIVE

## 2021-07-16 LAB — HEPATITIS A ANTIBODY, TOTAL: Hepatitis A AB,Total: NONREACTIVE

## 2021-07-23 ENCOUNTER — Other Ambulatory Visit: Payer: Self-pay

## 2021-07-23 DIAGNOSIS — R772 Abnormality of alphafetoprotein: Secondary | ICD-10-CM

## 2021-07-29 ENCOUNTER — Telehealth: Payer: Self-pay

## 2021-07-29 ENCOUNTER — Ambulatory Visit: Payer: Medicare Other | Admitting: Obstetrics and Gynecology

## 2021-07-29 NOTE — Telephone Encounter (Signed)
FYI: Patient went to [facility] on April 07,2023 and had Hep A and Hep B vaccines. ?

## 2021-08-01 NOTE — Telephone Encounter (Signed)
Noted  

## 2021-08-08 ENCOUNTER — Ambulatory Visit (HOSPITAL_COMMUNITY): Payer: Medicare Other

## 2021-08-08 DIAGNOSIS — N390 Urinary tract infection, site not specified: Secondary | ICD-10-CM | POA: Diagnosis not present

## 2021-08-08 DIAGNOSIS — F17210 Nicotine dependence, cigarettes, uncomplicated: Secondary | ICD-10-CM | POA: Diagnosis not present

## 2021-08-08 DIAGNOSIS — Z299 Encounter for prophylactic measures, unspecified: Secondary | ICD-10-CM | POA: Diagnosis not present

## 2021-08-08 DIAGNOSIS — R350 Frequency of micturition: Secondary | ICD-10-CM | POA: Diagnosis not present

## 2021-08-12 ENCOUNTER — Ambulatory Visit: Payer: Medicare Other | Admitting: Gynecologic Oncology

## 2021-08-14 ENCOUNTER — Ambulatory Visit (HOSPITAL_COMMUNITY)
Admission: RE | Admit: 2021-08-14 | Discharge: 2021-08-14 | Disposition: A | Discharge status: Home / Self Care | Payer: Medicare Other | Source: Ambulatory Visit | Attending: Gastroenterology | Admitting: Gastroenterology

## 2021-08-14 DIAGNOSIS — K760 Fatty (change of) liver, not elsewhere classified: Secondary | ICD-10-CM | POA: Diagnosis not present

## 2021-08-14 DIAGNOSIS — R590 Localized enlarged lymph nodes: Secondary | ICD-10-CM | POA: Diagnosis not present

## 2021-08-14 DIAGNOSIS — R161 Splenomegaly, not elsewhere classified: Secondary | ICD-10-CM | POA: Diagnosis not present

## 2021-08-14 DIAGNOSIS — K7460 Unspecified cirrhosis of liver: Secondary | ICD-10-CM | POA: Diagnosis not present

## 2021-08-14 DIAGNOSIS — R772 Abnormality of alphafetoprotein: Secondary | ICD-10-CM | POA: Diagnosis not present

## 2021-08-14 MED ORDER — GADOBUTROL 1 MMOL/ML IV SOLN
7.5000 mL | Freq: Once | INTRAVENOUS | Status: AC | PRN
  Administered 2021-08-14: 7.5 mL via INTRAVENOUS

## 2021-08-15 ENCOUNTER — Encounter: Payer: Self-pay | Admitting: Gynecologic Oncology

## 2021-08-22 ENCOUNTER — Encounter: Payer: Self-pay | Admitting: Gynecologic Oncology

## 2021-08-22 ENCOUNTER — Other Ambulatory Visit: Payer: Self-pay

## 2021-08-22 ENCOUNTER — Inpatient Hospital Stay: Payer: Medicare Other | Attending: Gynecologic Oncology | Admitting: Gynecologic Oncology

## 2021-08-22 VITALS — BP 103/64 | HR 99 | Temp 99.0°F | Resp 18 | Ht 65.0 in | Wt 169.8 lb

## 2021-08-22 DIAGNOSIS — T8130XA Disruption of wound, unspecified, initial encounter: Secondary | ICD-10-CM

## 2021-08-22 DIAGNOSIS — D071 Carcinoma in situ of vulva: Secondary | ICD-10-CM

## 2021-08-22 DIAGNOSIS — Z9079 Acquired absence of other genital organ(s): Secondary | ICD-10-CM
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
## 2021-08-22 NOTE — Patient Instructions (Signed)
It was good to see you today.  You are healing well from surgery. ? ?I will let your OB/GYN know that I have recommended close follow-up every 6 months for 5 years and then annually.  Please call your OB/GYN if you feel or see any new spots or have new symptoms. ?

## 2021-08-26 ENCOUNTER — Encounter: Payer: Self-pay | Admitting: Internal Medicine

## 2021-09-06 DIAGNOSIS — R350 Frequency of micturition: Secondary | ICD-10-CM | POA: Diagnosis not present

## 2021-09-06 DIAGNOSIS — F17210 Nicotine dependence, cigarettes, uncomplicated: Secondary | ICD-10-CM | POA: Diagnosis not present

## 2021-09-06 DIAGNOSIS — Z299 Encounter for prophylactic measures, unspecified: Secondary | ICD-10-CM | POA: Diagnosis not present

## 2021-09-06 DIAGNOSIS — Z713 Dietary counseling and surveillance: Secondary | ICD-10-CM | POA: Diagnosis not present

## 2021-09-24 ENCOUNTER — Encounter: Payer: Self-pay | Admitting: Obstetrics and Gynecology

## 2021-09-24 ENCOUNTER — Ambulatory Visit: Payer: Medicare Other | Admitting: Obstetrics and Gynecology

## 2021-09-24 VITALS — BP 121/78 | HR 73 | Ht 66.0 in | Wt 173.0 lb

## 2021-09-24 DIAGNOSIS — N3281 Overactive bladder: Secondary | ICD-10-CM

## 2021-09-24 DIAGNOSIS — N393 Stress incontinence (female) (male): Secondary | ICD-10-CM | POA: Diagnosis not present

## 2021-09-24 DIAGNOSIS — R350 Frequency of micturition: Secondary | ICD-10-CM

## 2021-09-24 DIAGNOSIS — R159 Full incontinence of feces: Secondary | ICD-10-CM

## 2021-09-24 LAB — POCT URINALYSIS DIPSTICK
Appearance: NORMAL
Bilirubin, UA: NEGATIVE
Blood, UA: NEGATIVE
Glucose, UA: NEGATIVE
Ketones, UA: NEGATIVE
Leukocytes, UA: NEGATIVE
Nitrite, UA: NEGATIVE
Protein, UA: NEGATIVE
Spec Grav, UA: 1.01 (ref 1.010–1.025)
Urobilinogen, UA: 0.2 E.U./dL
pH, UA: 7 (ref 5.0–8.0)

## 2021-09-24 NOTE — Patient Instructions (Signed)
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
## 2021-09-30 DIAGNOSIS — F17210 Nicotine dependence, cigarettes, uncomplicated: Secondary | ICD-10-CM | POA: Diagnosis not present

## 2021-09-30 DIAGNOSIS — M6289 Other specified disorders of muscle: Secondary | ICD-10-CM | POA: Diagnosis not present

## 2021-09-30 DIAGNOSIS — R350 Frequency of micturition: Secondary | ICD-10-CM | POA: Diagnosis not present

## 2021-09-30 DIAGNOSIS — N390 Urinary tract infection, site not specified: Secondary | ICD-10-CM | POA: Diagnosis not present

## 2021-09-30 DIAGNOSIS — Z299 Encounter for prophylactic measures, unspecified: Secondary | ICD-10-CM | POA: Diagnosis not present

## 2021-10-15 DIAGNOSIS — J449 Chronic obstructive pulmonary disease, unspecified: Secondary | ICD-10-CM | POA: Diagnosis not present

## 2021-10-15 DIAGNOSIS — Z299 Encounter for prophylactic measures, unspecified: Secondary | ICD-10-CM | POA: Diagnosis not present

## 2021-10-15 DIAGNOSIS — N390 Urinary tract infection, site not specified: Secondary | ICD-10-CM | POA: Diagnosis not present

## 2021-10-15 DIAGNOSIS — R350 Frequency of micturition: Secondary | ICD-10-CM | POA: Diagnosis not present

## 2021-10-15 DIAGNOSIS — F17210 Nicotine dependence, cigarettes, uncomplicated: Secondary | ICD-10-CM | POA: Diagnosis not present

## 2021-11-06 ENCOUNTER — Telehealth: Payer: Self-pay | Admitting: Internal Medicine

## 2021-11-06 NOTE — Telephone Encounter (Signed)
Recall for ultrasound 

## 2021-11-06 NOTE — Telephone Encounter (Signed)
Per result note 08/2021 pt not due until October. Please NIC thanks

## 2021-11-08 ENCOUNTER — Ambulatory Visit (INDEPENDENT_AMBULATORY_CARE_PROVIDER_SITE_OTHER): Payer: Medicare Other | Admitting: Obstetrics and Gynecology

## 2021-11-08 ENCOUNTER — Other Ambulatory Visit (HOSPITAL_COMMUNITY)
Admission: RE | Admit: 2021-11-08 | Discharge: 2021-11-08 | Disposition: A | Discharge status: Home / Self Care | Payer: Medicare Other | Attending: Obstetrics and Gynecology | Admitting: Obstetrics and Gynecology

## 2021-11-08 ENCOUNTER — Encounter: Payer: Self-pay | Admitting: Obstetrics and Gynecology

## 2021-11-08 VITALS — BP 118/76 | HR 84

## 2021-11-08 DIAGNOSIS — N3281 Overactive bladder: Secondary | ICD-10-CM

## 2021-11-08 DIAGNOSIS — N393 Stress incontinence (female) (male): Secondary | ICD-10-CM | POA: Diagnosis not present

## 2021-11-08 DIAGNOSIS — R82998 Other abnormal findings in urine: Secondary | ICD-10-CM

## 2021-11-08 DIAGNOSIS — B9620 Unspecified Escherichia coli [E. coli] as the cause of diseases classified elsewhere: Secondary | ICD-10-CM | POA: Diagnosis not present

## 2021-11-08 DIAGNOSIS — N390 Urinary tract infection, site not specified: Secondary | ICD-10-CM

## 2021-11-08 DIAGNOSIS — R350 Frequency of micturition: Secondary | ICD-10-CM

## 2021-11-08 MED ORDER — ESTRADIOL 0.1 MG/GM VA CREA: TOPICAL_CREAM | VAGINAL | 11 refills | Status: DC

## 2021-11-08 MED ORDER — NITROFURANTOIN MONOHYD MACRO 100 MG PO CAPS: 100.0000 mg | ORAL_CAPSULE | Freq: Every day | ORAL | 5 refills | Status: DC

## 2021-11-08 NOTE — Patient Instructions (Addendum)
URODYNAMICS (UDS) TEST INFORMATION  IMPORTANT: Please try to arrive with a comfortably full bladder!    What is UDS? Urodynamics is a bladder test used to evaluate how your bladder and urethra (tube you urinate out of) work to help find out the cause of your bladder symptoms and evaluate your bladder function in order to make the best treatment plan for you.   What to expect? A nurse will perform the test and will be with you during the entire exam. First we will have to empty your bladder on a special toilet.  After you have emptied your bladder, very small catheters (plastic tubing) will be placed into your bladder and into your vagina (or rectum). These special small catheters measure pressure to help measure your bladder function.  Your bladder will be gently filled with water and you will be asked to cough and strain at several different points during the test.   You will then be asked to empty your bladder in the special toilet with the catheters in place. Most patients can urinate (pee) easily with the catheters in place since the catheters are so small. In total this procedure lasts about 45 minutes to 1 hour.  After your test is completed, you will return (or possibly be seen the same day) to review the results, talk about treatment options and make a plan moving forward.   Start vaginal estrogen therapy nightly for two weeks then 2 times weekly at night for treatment of vaginal atrophy (dryness of the vaginal tissues).  Please let us know if the prescription is too expensive and we can look for alternative options.

## 2021-11-08 NOTE — Progress Notes (Unsigned)
[facility] Return Visit  SUBJECTIVE  History of Present Illness: Destiny Villanueva is a 61 y.o. female seen in follow-up for OAB, SUI, bowel leakage . Plan at last visit was physical therapy.   PT has improved her symptoms but she still having large amounts of leakage. Comes out of nowhere. Also having leakage with coughing. Has not had bowel leakage. She was started on Myrbertiq '25mg'$  about 4 weeks ago by her PCP. No longer has the sudden urge to go.   She finished antibiotics for a UTI a little over a week ago. Urine culture was sent and she reports that it is positive. Since October, has had several urinary tract infections.   Past Medical History: Patient  has a past medical history of Anxiety, Arthritis, CAD (coronary artery disease), Cirrhosis ([place]), Colon polyps (2012), Depression, DJD (degenerative joint disease), cervical, Emphysema/COPD ([place]), Fatty liver, GERD (gastroesophageal reflux disease), History of kidney stones, PONV (postoperative nausea and vomiting), Seasonal allergies, Sleep apnea, Spinal stenosis, Spleen enlarged, Thrombocytopenia ([place]) (10/12/2015), and Vulvar lesion.   Past Surgical History: She  has a past surgical history that includes Vaginal hysterectomy (1991); Foot surgery (Left, 2001); Leg Surgery (Left, 2009); Colonoscopy w/ polypectomy (09/2011); Vulvar lesion removal; Esophagogastroduodenoscopy (03/2013); Colonoscopy with propofol (N/A, 09/06/2015); Esophagogastroduodenoscopy (egd) with propofol (N/A, 09/06/2015); Laparoscopic cholecystectomy (2012); Colonoscopy with propofol (N/A, 08/15/2019); Esophagogastroduodenoscopy (egd) with propofol (N/A, 08/15/2019); maloney dilation (N/A, 08/15/2019); polypectomy (08/15/2019); Simple vulvectomy; and Vulvectomy partial (N/A, 06/05/2021).   Medications: She has a current medication list which includes the following prescription(s): albuterol, calcium carbonate, dialyvite vitamin d 5000, citalopram, [START ON  11/11/2021] estradiol, famotidine, fluticasone, lidocaine, mirabegron er, naproxen sodium, nitrofurantoin (macrocrystal-monohydrate), and rosuvastatin.   Allergies: Patient is allergic to aspirin, ciprofloxacin, hydrocodone, penicillins, alendronate, fluocinolone, and sulfa antibiotics.   Social History: Patient  reports that she has been smoking cigarettes. She has a 39.00 pack-year smoking history. She has never used smokeless tobacco. She reports that she does not drink alcohol and does not use drugs.      OBJECTIVE     Physical Exam: Vitals:   11/08/21 1521  BP: 118/76  Pulse: 84   Gen: No apparent distress, A&O x 3.  Detailed Urogynecologic Evaluation:  Deferred. Prior exam showed:  POP-Q:    POP-Q   -2                                            Aa   -2                                           Ba   -7.5                                              C    4                                            Gh   4  Pb   7.5                                            tvl    -2                                            Ap   -2                                            Bp                                                  D     POC urine: Large leukocytes, negative nitrites ASSESSMENT AND PLAN    Ms. Fromer is a 61 y.o. with:  1. Recurrent UTI   2. Urinary frequency   3. Leukocytes in urine   4. SUI (stress urinary incontinence, female)   5. Overactive bladder    - Possible UTI based on POC urine today. Will send for urine culture since she recently finished antibiotics. Urine culture records requested from her PCP office.  -For treatment of recurrent urinary tract infections, we discussed management of recurrent UTIs including prophylaxis with a daily low dose antibiotic, transvaginal estrogen therapy. Rx for estrace provided- use 0.5g nightly for two weeks then twice a week after. Also prescribed macrobid '100mg'$  daily.  -  Continue Myrbetriq '25mg'$  (prescribed by PCP) - For SUI reviewed non-surgical options of  physical therapy, as well as a pessary.  Surgical options include a midurethral sling, Burch urethropexy, and transurethral injection of a bulking agent. She is interested in urethral bulking. Will have her undergo urodynamic testing to demonstrate SUI  Return for urodynamics  Jaquita Folds, MD

## 2021-11-11 LAB — POCT URINALYSIS DIPSTICK
Bilirubin, UA: NEGATIVE
Glucose, UA: NEGATIVE
Ketones, UA: NEGATIVE
Nitrite, UA: NEGATIVE
Protein, UA: NEGATIVE
Spec Grav, UA: 1.02 (ref 1.010–1.025)
Urobilinogen, UA: 0.2 E.U./dL
pH, UA: 6.5 (ref 5.0–8.0)

## 2021-11-13 LAB — URINE CULTURE: Culture: >=100000 — AB

## 2021-11-21 ENCOUNTER — Encounter: Payer: Medicare Other | Admitting: Obstetrics and Gynecology

## 2021-11-21 NOTE — Progress Notes (Deleted)
[facility] Urodynamics Procedure  Referring Physician: Glenda Chroman, MD Date of Procedure: 11/21/2021  Destiny Villanueva is a 61 y.o. female who presents for urodynamic evaluation. Indication(s) for study: mixed incontinence  Patient currently takes Myrbetriq '25mg'$  daily.   Vital Signs: There were no vitals taken for this visit.  Laboratory Results: A {clean catch/ WFUX:32355} urine specimen revealed:  '@ENCLABS'$ @ '@LABRESULTS'$ @   Voiding Diary: The patient voided {NUMBERS; 1-20:18551} times per day. Voided volumes ranged from *** mL to *** mL, with an average of *** mL.  Intervals between voids ranged from *** hr to ***hr with an average interval between voids of *** hr.  Intake per day ranged from ***mL to ***mL.  Output ranged from ***mL to ***mL.  She had *** incontinence episodes per day (*** UUI, *** SUI) and *** episodes of nocturia.  She drinks: *** oz of caffeinated beverages, *** oz of juice, and *** oz of alcohol per day.  Procedure Timeout:  The correct patient was verified and the correct procedure was verified. The patient was in the correct position and safety precautions were reviewed based on at the patient's history.  Urodynamic Procedure A 58F dual lumen urodynamics catheter was placed under sterile conditions into the patient's bladder. A 58F catheter was placed into the {vagina/ rectum:24786} in order to measure abdominal pressure. EMG patches were placed in the appropriate position.  All connections were confirmed and calibrations/adjusted made. Saline was instilled into the bladder through the dual lumen catheters.  Cough/valsalva pressures were measured periodically during filling.  Patient was allowed to void.  The bladder was then emptied of its residual.  UROFLOW: Revealed a Qmax of *** mL/sec.  She voided *** mL and had a residual of *** mL.  It was a {uroflow:24789} pattern and represented normal habits ***though interpretation limited due to low  voided volume.***  CMG: This was performed with sterile water in the sitting position at a fill rate of 30*** mL/min.    First sensation of fullness was *** mLs,  First urge was *** mLs,  Strong urge was *** mLs and  Capacity was *** mLs  Stress incontinence {WAS/WAS NOT:[phone]::"was not"} demonstrated {highest/ lowest:24787} {Desc; negative/positive:13464} CLPP was *** cmH20 at *** ml. {highest/ lowest:24787} {Desc; negative/positive:13464} VLPP was *** cmH20at *** ml. {highest/ lowest:24787} {Desc; negative/positive:13464} Barrier CLPP was *** cmH20 at *** ml. {highest/ lowest:24787} {Desc; negative/positive:13464} Barrier VLPP was *** cmH20 at *** ml.  Detrusor function was {Desc; normal/underactive/overactive:13463}, {With/no:32556} phasic contractions seen.  The first occurred at *** mL to *** cm of water and {WAS/WAS NOT:[phone]::"was not"} associated with {urge/ DDUK:02542}.  Compliance:  ***. End fill detrusor pressure was ***cmH20.  Calculated compliance was ***HC/WCB76  UPP: MUCP with/ without barrier reduction was *** cm of water.    MICTURITION STUDY: Voiding was performed {reduction:24791} in the sitting position.  Pdet at Qmax was *** cm of water.  Qmax was *** mL/sec.  It was a {uroflow:24789} pattern.  She voided *** mL and had a residual of *** mL.  It was a volitional void, sustained detrusor contraction was {DESC; PRESENT/NOT PRESENT:21021351} and abdominal straining was {DESC; PRESENT/NOT PRESENT:21021351}  EMG: This was performed with patches.  She had voluntary contractions, recruitment with fill was {DESC; PRESENT/NOT PRESENT:21021351} and urethral sphincter was {relaxed:24792} with void.  The details of the procedure with the study tracings have been scanned into EPIC.   Urodynamic Impression:  1. Sensation was {DESC; NORMAL/REDUCED/ABSENT/INCREASED:23133}; capacity was {DESC; NORMAL/REDUCED/ABSENT/INCREASED:23133} 2. Stress Incontinence {WAS/WAS  HRC:[id number]::"OEH not"} demonstrated at {ISD:24793} pressures; 3. Detrusor Overactivity {WAS/WAS [phone]::"was not"} demonstrated {With-without:32421} leakage. 4. Emptying was {dysfunctional:24794} with a {elevated:24795} PVR, a sustained detrusor contraction {DESC; PRESENT/NOT PRESENT:21021351},  abdominal straining {DESC; PRESENT/NOT PRESENT:21021351}, {dyssynergic:24796} urethral sphincter activity on EMG.  Plan: - The patient will follow up  to discuss the findings and treatment options.

## 2021-11-26 DIAGNOSIS — R52 Pain, unspecified: Secondary | ICD-10-CM | POA: Diagnosis not present

## 2021-11-26 DIAGNOSIS — R079 Chest pain, unspecified: Secondary | ICD-10-CM | POA: Diagnosis not present

## 2021-11-26 DIAGNOSIS — R5383 Other fatigue: Secondary | ICD-10-CM | POA: Diagnosis not present

## 2021-11-26 DIAGNOSIS — I700 Atherosclerosis of aorta: Secondary | ICD-10-CM | POA: Diagnosis not present

## 2021-11-26 DIAGNOSIS — Z299 Encounter for prophylactic measures, unspecified: Secondary | ICD-10-CM | POA: Diagnosis not present

## 2021-11-26 DIAGNOSIS — Z789 Other specified health status: Secondary | ICD-10-CM | POA: Diagnosis not present

## 2021-12-02 DIAGNOSIS — R079 Chest pain, unspecified: Secondary | ICD-10-CM | POA: Diagnosis not present

## 2021-12-02 DIAGNOSIS — I6523 Occlusion and stenosis of bilateral carotid arteries: Secondary | ICD-10-CM | POA: Diagnosis not present

## 2021-12-02 DIAGNOSIS — R42 Dizziness and giddiness: Secondary | ICD-10-CM | POA: Diagnosis not present

## 2021-12-12 ENCOUNTER — Encounter: Payer: Self-pay | Admitting: Obstetrics and Gynecology

## 2021-12-12 ENCOUNTER — Ambulatory Visit (INDEPENDENT_AMBULATORY_CARE_PROVIDER_SITE_OTHER): Payer: Medicare Other | Admitting: Obstetrics and Gynecology

## 2021-12-12 VITALS — BP 119/78 | HR 80

## 2021-12-12 DIAGNOSIS — N393 Stress incontinence (female) (male): Secondary | ICD-10-CM

## 2021-12-12 NOTE — Progress Notes (Unsigned)
[facility] Urodynamics Procedure  Referring Physician: Glenda Chroman, MD Date of Procedure: 12/12/2021  Destiny Villanueva is a 61 y.o. female who presents for urodynamic evaluation. Indication(s) for study: mixed incontinence. She is currently on Myrbetriq for overactive bladder.   Vital Signs: BP 119/78   Pulse 80   Laboratory Results: A catheterized urine specimen revealed:  POC urine: negative   Voiding Diary: Not performed  Procedure Timeout:  The correct patient was verified and the correct procedure was verified. The patient was in the correct position and safety precautions were reviewed based on at the patient's history.  Urodynamic Procedure A 52F dual lumen urodynamics catheter was placed under sterile conditions into the patient's bladder. A 52F catheter was placed into the vagina in order to measure abdominal pressure. EMG patches were placed in the appropriate position.  All connections were confirmed and calibrations/adjusted made. Saline was instilled into the bladder through the dual lumen catheters.  Cough/valsalva pressures were measured periodically during filling.  Patient was allowed to void.  The bladder was then emptied of its residual.  UROFLOW: Revealed a Qmax of 16 mL/sec.  She voided 145 mL and had a residual of 45 mL.  It was a intermittent pattern and represented normal habits.  CMG: This was performed with sterile water in the sitting position at a fill rate of 10- 30 mL/min.    First sensation of fullness was 71 mLs,  First urge was 108 mLs,  Strong urge was 112 mLs and  Capacity was 262 mLs  Stress incontinence was demonstrated Lowest positive CLPP was 224 cmH20 at 186 ml in the standing position. Highest negative VLPP was 217 cmH20at 186 ml.  Detrusor function was normal, with no phasic contractions seen.    Compliance:  normal. End fill detrusor pressure was -1.7cmH20.    UPP: MUCP without barrier reduction was 106 cm of  water.    MICTURITION STUDY: Voiding was performed without reduction in the sitting position.  Pdet at Qmax was 16 ml/s.  Qmax was 5 mL/sec.  It was a interrupted pattern.  She voided 5 mL and had a residual of 110 mL.  It was a volitional void, sustained detrusor contraction was present and abdominal straining was not present  EMG: This was performed with patches.  She had voluntary contractions, urethral sphincter was relaxed with void.  The details of the procedure with the study tracings have been scanned into EPIC.   Urodynamic Impression:  1. Sensation was increased; capacity was reduced 2. Stress Incontinence was demonstrated at normal pressures; 3. Detrusor Overactivity was not demonstrated. 4. Emptying was normal with a normal PVR (143m), a sustained detrusor contraction present,  abdominal straining present, normal urethral sphincter activity on EMG.  Plan: - The patient will follow up  to discuss the findings and treatment options.

## 2021-12-12 NOTE — Patient Instructions (Signed)
Taking Care of Yourself after Urodynamics, Cystoscopy, Coaptite Injection, or Botox Injection   Drink plenty of water for a day or two following your procedure. Try to have about 8 ounces (one cup) at a time, and do this 6 times or more per day unless you have fluid restrictitons AVOID irritative beverages such as coffee, tea, soda, alcoholic or citrus drinks for a day or two, as this may cause burning with urination.  For the first 1-2 days after the procedure, your urine may be pink or red in color. You may have some blood in your urine as a normal side effect of the procedure. Large amounts of bleeding or difficulty urinating are NOT normal. Call the nurse line if this happens or go to the nearest Emergency Room if the bleeding is heavy or you cannot urinate at all and it is after hours. You may experience some discomfort or a burning sensation with urination after having this procedure. You can use over the counter Azo or pyridium to help with burning and follow the instructions on the packaging. If it does not improve within 1-2 days, or other symptoms appear (fever, chills, or difficulty urinating) call the office to speak to a nurse.  You may return to normal daily activities such as work, school, driving, exercising and housework on the day of the procedure.  

## 2021-12-19 DIAGNOSIS — I25118 Atherosclerotic heart disease of native coronary artery with other forms of angina pectoris: Secondary | ICD-10-CM | POA: Diagnosis not present

## 2021-12-19 DIAGNOSIS — E782 Mixed hyperlipidemia: Secondary | ICD-10-CM | POA: Diagnosis not present

## 2021-12-19 DIAGNOSIS — I700 Atherosclerosis of aorta: Secondary | ICD-10-CM | POA: Diagnosis not present

## 2021-12-20 ENCOUNTER — Encounter: Payer: Self-pay | Admitting: Obstetrics and Gynecology

## 2021-12-20 ENCOUNTER — Ambulatory Visit (INDEPENDENT_AMBULATORY_CARE_PROVIDER_SITE_OTHER): Payer: Medicare Other | Admitting: Obstetrics and Gynecology

## 2021-12-20 VITALS — BP 123/81 | HR 97

## 2021-12-20 DIAGNOSIS — N3281 Overactive bladder: Secondary | ICD-10-CM

## 2021-12-20 DIAGNOSIS — N393 Stress incontinence (female) (male): Secondary | ICD-10-CM | POA: Diagnosis not present

## 2021-12-20 MED ORDER — DIAZEPAM 5 MG PO TABS: ORAL_TABLET | ORAL | 0 refills | Status: DC

## 2021-12-20 MED ORDER — NITROFURANTOIN MONOHYD MACRO 100 MG PO CAPS: 100.0000 mg | ORAL_CAPSULE | Freq: Two times a day (BID) | ORAL | 0 refills | Status: AC

## 2021-12-20 NOTE — Progress Notes (Signed)
[facility] Return Visit  SUBJECTIVE  History of Present Illness: Destiny Villanueva is a 61 y.o. female seen in follow-up for mixed incontinence and recurrent UTI.  She underwent urodynamic testing. She is interested in treatment for her SUI. Has had leakage throughout the day.   Has been on myrbetriq for her OAB and is taking nitrofurantoin prophylaxis along with vaginal estrogen for UTI.    Urodynamic Impression:  1. Sensation was increased; capacity was reduced 2. Stress Incontinence was demonstrated at normal pressures; 3. Detrusor Overactivity was not demonstrated. 4. Emptying was normal with a normal PVR (139m), a sustained detrusor contraction present,  abdominal straining present, normal urethral sphincter activity on EMG  Past Medical History: Patient  has a past medical history of Anxiety, Arthritis, CAD (coronary artery disease), [place], Colon polyps (2012), Depression, DJD (degenerative joint disease), cervical, [place]D [place], Fatty liver, GERD (gastroesophageal reflux disease), History of kidney stones, PONV (postoperative nausea and vomiting), Seasonal allergies, Sleep apnea, Spinal stenosis, Spleen enlarged, [place]), and Vulvar lesion.   Past Surgical History: She  has a past surgical history that includes Vaginal hysterectomy (1991); Foot surgery (Left, 2001); Leg Surgery (Left, 2009); Colonoscopy w/ polypectomy (09/2011); Vulvar lesion removal; Esophagogastroduodenoscopy (03/2013); Colonoscopy with propofol (N/A, 09/06/2015); Esophagogastroduodenoscopy (egd) with propofol (N/A, 09/06/2015); Laparoscopic cholecystectomy (2012); Colonoscopy with propofol (N/A, 08/15/2019); Esophagogastroduodenoscopy (egd) with propofol (N/A, 08/15/2019); maloney dilation (N/A, 08/15/2019); polypectomy (08/15/2019); Simple vulvectomy; and Vulvectomy partial (N/A, 06/05/2021).   Medications: She has a current medication list which includes  the following prescription(s): diazepam, nitrofurantoin (macrocrystal-monohydrate), albuterol, calcium carbonate, dialyvite vitamin d 5000, citalopram, estradiol, famotidine, fluticasone, lidocaine, mirabegron er, naproxen sodium, nitrofurantoin (macrocrystal-monohydrate), and rosuvastatin.   Allergies: Patient is allergic to aspirin, ciprofloxacin, hydrocodone, penicillins, alendronate, fluocinolone, and sulfa antibiotics.   Social History: Patient  reports that she has been smoking cigarettes. She has a 39.00 pack-year smoking history. She has never used smokeless tobacco. She reports that she does not drink alcohol and does not use drugs.      OBJECTIVE     Physical Exam: Vitals:   12/20/21 1014  BP: 123/81  Pulse: 97   Gen: No apparent distress, A&O x 3.  Detailed Urogynecologic Evaluation:  Deferred. Prior exam showed:  POP-Q:    POP-Q   -2                                            Aa   -2                                           Ba   -7.5                                              C    4                                            Gh   4  Pb   7.5                                            tvl    -2                                            Ap   -2                                            Bp                                                  D       ASSESSMENT AND PLAN    Ms. Lampert is a 61 y.o. with:  1. SUI (stress urinary incontinence, female)   2. Overactive bladder    SUI - Reviewed options of urethral bulking vs sling. She wants to proceed with urethral bulking. Discussed risks and benefits and possible need for repeat injection.  - She wants to have the procedure in the office. Will prescribe '5mg'$  valium to take 30 min prior to procedure. She will need someone to drive her to the appointment.  - Also prescribed nitrofurantoin to take the day of the procedure.   2. OAB - Continue with  myrbetriq  Jaquita Folds, MD

## 2021-12-24 NOTE — Progress Notes (Signed)
PA check for Destiny Villanueva is a 61 y.o. female for Urethral Bulking [id number]) on [email or web address] NO PA REQUIRED for Urethral Bulking. Notification scanned into the chart.

## 2021-12-31 DIAGNOSIS — I2510 Atherosclerotic heart disease of native coronary artery without angina pectoris: Secondary | ICD-10-CM | POA: Diagnosis not present

## 2021-12-31 DIAGNOSIS — I209 Angina pectoris, unspecified: Secondary | ICD-10-CM | POA: Diagnosis not present

## 2021-12-31 DIAGNOSIS — I700 Atherosclerosis of aorta: Secondary | ICD-10-CM | POA: Diagnosis not present

## 2022-01-06 ENCOUNTER — Telehealth: Payer: Self-pay | Admitting: *Deleted

## 2022-01-06 NOTE — Telephone Encounter (Signed)
Recall letter mailed 

## 2022-01-06 NOTE — Telephone Encounter (Signed)
Patient on recall for repeat US 

## 2022-01-13 ENCOUNTER — Other Ambulatory Visit: Payer: Self-pay | Admitting: *Deleted

## 2022-01-13 ENCOUNTER — Telehealth: Payer: Self-pay | Admitting: *Deleted

## 2022-01-13 DIAGNOSIS — R772 Abnormality of alphafetoprotein: Secondary | ICD-10-CM

## 2022-01-13 NOTE — Telephone Encounter (Signed)
Pt called and left vm stating got letter stating need to call and schedule [place].  Advised pt [place] is scheduled for 01/31/22 at 9:30 am. Pt informed to arrive at 9:00 am, nothing to eat or drink after midnight.  Pt verbalized understanding.

## 2022-01-15 DIAGNOSIS — Z789 Other specified health status: Secondary | ICD-10-CM | POA: Diagnosis not present

## 2022-01-15 DIAGNOSIS — N390 Urinary tract infection, site not specified: Secondary | ICD-10-CM | POA: Diagnosis not present

## 2022-01-15 DIAGNOSIS — Z299 Encounter for prophylactic measures, unspecified: Secondary | ICD-10-CM | POA: Diagnosis not present

## 2022-01-15 DIAGNOSIS — R350 Frequency of micturition: Secondary | ICD-10-CM | POA: Diagnosis not present

## 2022-01-15 DIAGNOSIS — Z23 Encounter for immunization: Secondary | ICD-10-CM | POA: Diagnosis not present

## 2022-01-29 DIAGNOSIS — Z299 Encounter for prophylactic measures, unspecified: Secondary | ICD-10-CM | POA: Diagnosis not present

## 2022-01-29 DIAGNOSIS — Z0000 Encounter for general adult medical examination without abnormal findings: Secondary | ICD-10-CM | POA: Diagnosis not present

## 2022-01-29 DIAGNOSIS — Z7189 Other specified counseling: Secondary | ICD-10-CM | POA: Diagnosis not present

## 2022-01-29 DIAGNOSIS — R5383 Other fatigue: Secondary | ICD-10-CM | POA: Diagnosis not present

## 2022-01-29 DIAGNOSIS — Z136 Encounter for screening for cardiovascular disorders: Secondary | ICD-10-CM | POA: Diagnosis not present

## 2022-01-29 DIAGNOSIS — Z1389 Encounter for screening for other disorder: Secondary | ICD-10-CM | POA: Diagnosis not present

## 2022-01-29 DIAGNOSIS — Z79899 Other long term (current) drug therapy: Secondary | ICD-10-CM | POA: Diagnosis not present

## 2022-01-29 DIAGNOSIS — F17210 Nicotine dependence, cigarettes, uncomplicated: Secondary | ICD-10-CM | POA: Diagnosis not present

## 2022-01-29 DIAGNOSIS — E7800 Pure hypercholesterolemia, unspecified: Secondary | ICD-10-CM | POA: Diagnosis not present

## 2022-01-30 DIAGNOSIS — M810 Age-related osteoporosis without current pathological fracture: Secondary | ICD-10-CM | POA: Diagnosis not present

## 2022-01-30 DIAGNOSIS — R739 Hyperglycemia, unspecified: Secondary | ICD-10-CM | POA: Diagnosis not present

## 2022-01-30 DIAGNOSIS — E7800 Pure hypercholesterolemia, unspecified: Secondary | ICD-10-CM | POA: Diagnosis not present

## 2022-01-30 DIAGNOSIS — R5383 Other fatigue: Secondary | ICD-10-CM | POA: Diagnosis not present

## 2022-01-30 DIAGNOSIS — Z79899 Other long term (current) drug therapy: Secondary | ICD-10-CM | POA: Diagnosis not present

## 2022-01-31 ENCOUNTER — Ambulatory Visit (HOSPITAL_COMMUNITY)
Admission: RE | Admit: 2022-01-31 | Discharge: 2022-01-31 | Disposition: A | Discharge status: Home / Self Care | Payer: Medicare Other | Source: Ambulatory Visit | Attending: Internal Medicine | Admitting: Internal Medicine

## 2022-01-31 DIAGNOSIS — R772 Abnormality of alphafetoprotein: Secondary | ICD-10-CM | POA: Insufficient documentation

## 2022-01-31 DIAGNOSIS — K760 Fatty (change of) liver, not elsewhere classified: Secondary | ICD-10-CM | POA: Diagnosis not present

## 2022-01-31 DIAGNOSIS — K7460 Unspecified cirrhosis of liver: Secondary | ICD-10-CM | POA: Diagnosis not present

## 2022-02-04 DIAGNOSIS — M818 Other osteoporosis without current pathological fracture: Secondary | ICD-10-CM | POA: Diagnosis not present

## 2022-02-04 DIAGNOSIS — Z79899 Other long term (current) drug therapy: Secondary | ICD-10-CM | POA: Diagnosis not present

## 2022-02-04 DIAGNOSIS — M859 Disorder of bone density and structure, unspecified: Secondary | ICD-10-CM | POA: Diagnosis not present

## 2022-02-04 DIAGNOSIS — Z7952 Long term (current) use of systemic steroids: Secondary | ICD-10-CM | POA: Diagnosis not present

## 2022-02-05 ENCOUNTER — Other Ambulatory Visit: Payer: Self-pay

## 2022-02-05 DIAGNOSIS — R161 Splenomegaly, not elsewhere classified: Secondary | ICD-10-CM

## 2022-02-05 DIAGNOSIS — R772 Abnormality of alphafetoprotein: Secondary | ICD-10-CM

## 2022-02-06 ENCOUNTER — Ambulatory Visit: Payer: Medicare Other | Admitting: Obstetrics and Gynecology

## 2022-02-10 DIAGNOSIS — R772 Abnormality of alphafetoprotein: Secondary | ICD-10-CM | POA: Diagnosis not present

## 2022-02-10 DIAGNOSIS — R161 Splenomegaly, not elsewhere classified: Secondary | ICD-10-CM | POA: Diagnosis not present

## 2022-02-10 DIAGNOSIS — K7460 Unspecified cirrhosis of liver: Secondary | ICD-10-CM | POA: Diagnosis not present

## 2022-02-11 LAB — AFP TUMOR MARKER: AFP-Tumor Marker: 6.7 ng/mL — ABNORMAL HIGH

## 2022-02-18 ENCOUNTER — Telehealth: Payer: Self-pay | Admitting: *Deleted

## 2022-02-18 NOTE — Telephone Encounter (Signed)
Had done 01/2022 and repeat in 6 months

## 2022-02-18 NOTE — Telephone Encounter (Signed)
Patient on recall for repeat US 

## 2022-02-24 DIAGNOSIS — F17210 Nicotine dependence, cigarettes, uncomplicated: Secondary | ICD-10-CM | POA: Diagnosis not present

## 2022-02-24 DIAGNOSIS — R059 Cough, unspecified: Secondary | ICD-10-CM | POA: Diagnosis not present

## 2022-02-24 DIAGNOSIS — J069 Acute upper respiratory infection, unspecified: Secondary | ICD-10-CM | POA: Diagnosis not present
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
## 2022-02-26 ENCOUNTER — Ambulatory Visit (HOSPITAL_COMMUNITY): Payer: Medicare Other

## 2022-02-26 ENCOUNTER — Other Ambulatory Visit: Payer: Self-pay

## 2022-02-26 ENCOUNTER — Encounter: Payer: Self-pay | Admitting: Gastroenterology

## 2022-02-26 ENCOUNTER — Ambulatory Visit: Payer: Medicare Other | Admitting: Gastroenterology

## 2022-02-26 VITALS — BP 120/71 | HR 84 | Temp 98.1°F | Ht 66.0 in | Wt 172.0 lb

## 2022-02-26 DIAGNOSIS — K219 Gastro-esophageal reflux disease without esophagitis: Secondary | ICD-10-CM

## 2022-02-26 DIAGNOSIS — Z86010 Personal history of colonic polyps: Secondary | ICD-10-CM

## 2022-02-26 DIAGNOSIS — R772 Abnormality of alphafetoprotein: Secondary | ICD-10-CM

## 2022-02-26 DIAGNOSIS — K760 Fatty (change of) liver, not elsewhere classified: Secondary | ICD-10-CM | POA: Insufficient documentation

## 2022-02-26 DIAGNOSIS — R161 Splenomegaly, not elsewhere classified: Secondary | ICD-10-CM

## 2022-02-26 NOTE — Patient Instructions (Signed)
We will continue to monitor your liver closely with labs and ultrasound every six months.  When you return in 07/2022, we will update labs, and schedule your colonoscopy. Call if you have any questions or concerns in the interim.

## 2022-02-28 ENCOUNTER — Ambulatory Visit (HOSPITAL_COMMUNITY)
Admission: RE | Admit: 2022-02-28 | Discharge: 2022-02-28 | Disposition: A | Discharge status: Home / Self Care | Payer: Medicare Other | Source: Ambulatory Visit | Attending: Acute Care | Admitting: Acute Care

## 2022-02-28 DIAGNOSIS — F17210 Nicotine dependence, cigarettes, uncomplicated: Secondary | ICD-10-CM | POA: Diagnosis not present

## 2022-02-28 DIAGNOSIS — Z87891 Personal history of nicotine dependence: Secondary | ICD-10-CM | POA: Insufficient documentation

## 2022-03-06 DIAGNOSIS — I700 Atherosclerosis of aorta: Secondary | ICD-10-CM | POA: Diagnosis not present

## 2022-03-06 DIAGNOSIS — Z299 Encounter for prophylactic measures, unspecified: Secondary | ICD-10-CM | POA: Diagnosis not present

## 2022-03-06 DIAGNOSIS — Z0000 Encounter for general adult medical examination without abnormal findings: Secondary | ICD-10-CM | POA: Diagnosis not present

## 2022-03-06 DIAGNOSIS — J069 Acute upper respiratory infection, unspecified: Secondary | ICD-10-CM | POA: Diagnosis not present

## 2022-03-06 DIAGNOSIS — J449 Chronic obstructive pulmonary disease, unspecified: Secondary | ICD-10-CM | POA: Diagnosis not present

## 2022-03-06 DIAGNOSIS — F17210 Nicotine dependence, cigarettes, uncomplicated: Secondary | ICD-10-CM | POA: Diagnosis not present

## 2022-03-11 ENCOUNTER — Telehealth: Payer: Self-pay | Admitting: *Deleted

## 2022-03-11 NOTE — Telephone Encounter (Signed)
Left message for pt to call back to discuss lung screening CT results.  

## 2022-03-17 ENCOUNTER — Other Ambulatory Visit: Payer: Self-pay | Admitting: Acute Care

## 2022-03-17 DIAGNOSIS — Z122 Encounter for screening for malignant neoplasm of respiratory organs: Secondary | ICD-10-CM

## 2022-03-17 DIAGNOSIS — Z87891 Personal history of nicotine dependence: Secondary | ICD-10-CM

## 2022-03-17 DIAGNOSIS — F17210 Nicotine dependence, cigarettes, uncomplicated: Secondary | ICD-10-CM

## 2022-03-17 NOTE — Telephone Encounter (Signed)
Result letter sent to pt via Mychart. CT results faxed to PCP. Order placed for 12 mth follow up lung screening CT.

## 2022-03-27 DIAGNOSIS — H524 Presbyopia: Secondary | ICD-10-CM | POA: Diagnosis not present

## 2022-04-28 DIAGNOSIS — I25118 Atherosclerotic heart disease of native coronary artery with other forms of angina pectoris: Secondary | ICD-10-CM | POA: Diagnosis not present

## 2022-05-02 DIAGNOSIS — Z1231 Encounter for screening mammogram for malignant neoplasm of breast: Secondary | ICD-10-CM | POA: Diagnosis not present

## 2022-05-20 ENCOUNTER — Encounter: Payer: Self-pay | Admitting: Gynecologic Oncology

## 2022-06-03 ENCOUNTER — Telehealth: Payer: Self-pay

## 2022-06-03 NOTE — Telephone Encounter (Signed)
Pt called office stating she has had a "cyst" come up on the inside of her vagina. It has been there x 3 weeks. She can not see anything but feels it. No pain/no bleeding. She went to her OB/GYN today at [facility] and they told her they do not want to do anything to it that she needs to come here and follow up with Dr.Tucker.  Waiting on records from her GYN.

## 2022-06-06 NOTE — Telephone Encounter (Signed)
Told Ms Laton that her records from [facility] were received yesterday.  The scheduler will call her once an appointment date and time is determined. Pt appreciated the follow up.

## 2022-06-10 DIAGNOSIS — J449 Chronic obstructive pulmonary disease, unspecified: Secondary | ICD-10-CM | POA: Diagnosis not present

## 2022-06-10 DIAGNOSIS — F17210 Nicotine dependence, cigarettes, uncomplicated: Secondary | ICD-10-CM | POA: Diagnosis not present

## 2022-06-10 DIAGNOSIS — Z299 Encounter for prophylactic measures, unspecified: Secondary | ICD-10-CM | POA: Diagnosis not present

## 2022-06-10 DIAGNOSIS — I700 Atherosclerosis of aorta: Secondary | ICD-10-CM | POA: Diagnosis not present

## 2022-06-10 DIAGNOSIS — J309 Allergic rhinitis, unspecified: Secondary | ICD-10-CM | POA: Diagnosis not present

## 2022-06-19 ENCOUNTER — Encounter: Payer: Self-pay | Admitting: Gynecologic Oncology

## 2022-06-20 ENCOUNTER — Telehealth: Payer: Self-pay | Admitting: *Deleted

## 2022-06-20 NOTE — Telephone Encounter (Signed)
Spoke with the patient and scheduled a follow up appt with Dr Delsa Sale on 3/6

## 2022-06-23 ENCOUNTER — Encounter: Payer: Self-pay | Admitting: Internal Medicine

## 2022-06-25 ENCOUNTER — Encounter: Payer: Self-pay | Admitting: Obstetrics & Gynecology

## 2022-06-25 ENCOUNTER — Other Ambulatory Visit: Payer: Self-pay

## 2022-06-25 ENCOUNTER — Inpatient Hospital Stay: Payer: Medicare Other | Attending: Obstetrics & Gynecology | Admitting: Obstetrics & Gynecology

## 2022-06-25 VITALS — BP 113/61 | HR 82 | Temp 98.4°F | Resp 16 | Ht 66.0 in | Wt 173.0 lb

## 2022-06-25 DIAGNOSIS — D071 Carcinoma in situ of vulva: Secondary | ICD-10-CM

## 2022-06-25 DIAGNOSIS — Z86002 Personal history of in-situ neoplasm of other and unspecified genital organs: Secondary | ICD-10-CM | POA: Insufficient documentation

## 2022-06-25 NOTE — Assessment & Plan Note (Signed)
H/O VIN III No palpable abnormality or obvious new lesions on today's exam  >short interval follow-up in 3 mos or prn

## 2022-06-25 NOTE — Patient Instructions (Signed)
Return in 3 mos or prn

## 2022-06-25 NOTE — Progress Notes (Signed)
Follow Up Note: Gyn-Onc  Destiny Villanueva 62 y.o. female  CC: New lesions   HPI: History of VIN III 06/05/2021: Partial simple right posterior vulvectomy for high-grade vulvar dysplasia.  Pathology reveals VIN 3, margins negative for dysplasia.  No invasive carcinoma.   Interval History: She has palpated new lesions x 2, 4 weeks ago.  No associated itching.  She presented to her local Gynecologist and biopsies were recommended.  In light of her history it was recommend that she follow-up in this office for further evaluation.      Review of Systems  Review of Systems  Constitutional:  Negative for malaise/fatigue and weight loss.  Respiratory:  Negative for shortness of breath and wheezing.   Cardiovascular:  Negative for chest pain and leg swelling.  Gastrointestinal:  Negative for abdominal pain, blood in stool, constipation, nausea and vomiting.  Genitourinary:  See above  Musculoskeletal:  Negative for joint pain and myalgias.  Neurological:  Negative for weakness.  Psychiatric/Behavioral:  Negative for depression. The patient does not have insomnia.    Current medications, allergy, social history, past surgical history, past medical history, family history were all reviewed.    Vitals:  BP 113/61 (BP Location: Left Arm, Patient Position: Sitting)   Pulse 82   Temp 98.4 F (36.9 C) (Oral)   Resp 16   Ht '5\' 6"'$  (1.676 m)   Wt 173 lb (78.5 kg)   SpO2 98%   BMI 27.92 kg/m    Physical Exam:  Physical Exam Exam conducted with a chaperone present.  Constitutional:      General: She is not in acute distress. Cardiovascular:     Rate and Rhythm: Normal rate and regular rhythm.  Pulmonary:     Effort: Pulmonary effort is normal.     Breath sounds: Normal breath sounds. No wheezing or rhonchi.  Abdominal:     Palpations: Abdomen is soft.     Tenderness: There is no abdominal tenderness. There is no right CVA tenderness or left CVA tenderness.     Hernia: No hernia is  present.  Genitourinary:    General: Right-sided white patch/scar; acetic acid applied without any aceto-white areas    Urethra: No urethral lesion.     Vagina: No lesions. No bleeding Musculoskeletal:     Cervical back: Neck supple.     Right lower leg: No edema.     Left lower leg: No edema.  Lymphadenopathy:     Upper Body:     Right upper body: No supraclavicular adenopathy.     Left upper body: No supraclavicular adenopathy.     Lower Body: No right inguinal adenopathy. No left inguinal adenopathy.  Skin:    Findings: No rash.  Neurological:     Mental Status: She is oriented to person, place, and time.     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    Lahoma Crocker, MD

## 2022-06-30 ENCOUNTER — Encounter: Payer: Self-pay | Admitting: *Deleted

## 2022-07-14 ENCOUNTER — Other Ambulatory Visit: Payer: Self-pay

## 2022-07-14 DIAGNOSIS — K760 Fatty (change of) liver, not elsewhere classified: Secondary | ICD-10-CM

## 2022-07-14 DIAGNOSIS — R772 Abnormality of alphafetoprotein: Secondary | ICD-10-CM

## 2022-07-14 DIAGNOSIS — R161 Splenomegaly, not elsewhere classified: Secondary | ICD-10-CM

## 2022-07-21 ENCOUNTER — Other Ambulatory Visit (HOSPITAL_COMMUNITY)
Admission: RE | Admit: 2022-07-21 | Discharge: 2022-07-21 | Disposition: A | Discharge status: Home / Self Care | Payer: Medicare Other | Source: Ambulatory Visit | Attending: Gastroenterology | Admitting: Gastroenterology

## 2022-07-21 DIAGNOSIS — R161 Splenomegaly, not elsewhere classified: Secondary | ICD-10-CM | POA: Insufficient documentation

## 2022-07-21 DIAGNOSIS — R772 Abnormality of alphafetoprotein: Secondary | ICD-10-CM | POA: Diagnosis not present

## 2022-07-21 DIAGNOSIS — K760 Fatty (change of) liver, not elsewhere classified: Secondary | ICD-10-CM | POA: Insufficient documentation

## 2022-07-21 DIAGNOSIS — K7460 Unspecified cirrhosis of liver: Secondary | ICD-10-CM | POA: Insufficient documentation

## 2022-07-21 LAB — CBC WITH DIFFERENTIAL/PLATELET
Abs Immature Granulocytes: 0.05 10*3/uL (ref 0.00–0.07)
Basophils Absolute: 0.1 10*3/uL (ref 0.0–0.1)
Basophils Relative: 1 %
Eosinophils Absolute: 0.1 10*3/uL (ref 0.0–0.5)
Eosinophils Relative: 2 %
HCT: 41.6 % (ref 36.0–46.0)
Hemoglobin: 13.7 g/dL (ref 12.0–15.0)
Immature Granulocytes: 1 %
Lymphocytes Relative: 23 %
Lymphs Abs: 1.1 10*3/uL (ref 0.7–4.0)
MCH: 28.2 pg (ref 26.0–34.0)
MCHC: 32.9 g/dL (ref 30.0–36.0)
MCV: 85.6 fL (ref 80.0–100.0)
Monocytes Absolute: 0.3 10*3/uL (ref 0.1–1.0)
Monocytes Relative: 7 %
Neutro Abs: 3.1 10*3/uL (ref 1.7–7.7)
Neutrophils Relative %: 66 %
Platelets: ADEQUATE 10*3/uL (ref 150–400)
RBC: 4.86 MIL/uL (ref 3.87–5.11)
RDW: 13.5 % (ref 11.5–15.5)
WBC: 4.7 10*3/uL (ref 4.0–10.5)
nRBC: 0 % (ref 0.0–0.2)

## 2022-07-21 LAB — COMPREHENSIVE METABOLIC PANEL
ALT: 19 U/L (ref 0–44)
AST: 32 U/L (ref 15–41)
Albumin: 4.1 g/dL (ref 3.5–5.0)
Alkaline Phosphatase: 41 U/L (ref 38–126)
Anion gap: 9 (ref 5–15)
BUN: 11 mg/dL (ref 8–23)
CO2: 26 mmol/L (ref 22–32)
Calcium: 8.9 mg/dL (ref 8.9–10.3)
Chloride: 102 mmol/L (ref 98–111)
Creatinine, Ser: 0.85 mg/dL (ref 0.44–1.00)
GFR, Estimated: >60 mL/min (ref >60)
Glucose, Bld: 101 mg/dL — ABNORMAL HIGH (ref 70–99)
Potassium: 4.7 mmol/L (ref 3.5–5.1)
Sodium: 137 mmol/L (ref 135–145)
Total Bilirubin: 0.6 mg/dL (ref 0.3–1.2)
Total Protein: 6.8 g/dL (ref 6.5–8.1)

## 2022-07-21 LAB — PROTIME-INR
INR: 1 (ref 0.8–1.2)
Prothrombin Time: 13.4 seconds (ref 11.4–15.2)

## 2022-07-21 LAB — HEPATITIS A ANTIBODY, TOTAL: hep A Total Ab: REACTIVE — AB

## 2022-07-21 LAB — HEPATITIS B SURFACE ANTIBODY,QUALITATIVE: Hep B S Ab: NONREACTIVE

## 2022-07-22 LAB — AFP TUMOR MARKER: AFP, Serum, Tumor Marker: 7.4 ng/mL (ref 0.0–9.2)

## 2022-07-25 ENCOUNTER — Other Ambulatory Visit: Payer: Self-pay | Admitting: *Deleted

## 2022-07-25 DIAGNOSIS — K760 Fatty (change of) liver, not elsewhere classified: Secondary | ICD-10-CM

## 2022-07-25 DIAGNOSIS — R772 Abnormality of alphafetoprotein: Secondary | ICD-10-CM

## 2022-07-25 DIAGNOSIS — R161 Splenomegaly, not elsewhere classified: Secondary | ICD-10-CM

## 2022-07-30 ENCOUNTER — Ambulatory Visit: Payer: Medicare Other | Admitting: Gastroenterology

## 2022-07-30 ENCOUNTER — Encounter: Payer: Self-pay | Admitting: Gastroenterology

## 2022-07-30 VITALS — BP 103/67 | HR 80 | Temp 98.2°F | Ht 66.0 in | Wt 175.2 lb

## 2022-07-30 DIAGNOSIS — K219 Gastro-esophageal reflux disease without esophagitis: Secondary | ICD-10-CM | POA: Diagnosis not present

## 2022-07-30 DIAGNOSIS — Z86010 Personal history of colonic polyps: Secondary | ICD-10-CM

## 2022-07-30 DIAGNOSIS — K760 Fatty (change of) liver, not elsewhere classified: Secondary | ICD-10-CM | POA: Diagnosis not present
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
## 2022-07-30 NOTE — Patient Instructions (Addendum)
Colonoscopy/upper endoscopy to be scheduled. See separate instructions.  Complete ultrasound as scheduled. We will be in touch with results as available. Continue famotidine 20mg  daily.

## 2022-07-31 ENCOUNTER — Telehealth: Payer: Self-pay | Admitting: *Deleted

## 2022-07-31 NOTE — Telephone Encounter (Signed)
LMOVM to call back to schedule TCS/egd W/ PROPOFOL ASA 3, Dr. Jena Gauss, bisacodyl 15mg  3 days before prep

## 2022-08-04 ENCOUNTER — Encounter: Payer: Self-pay | Admitting: *Deleted

## 2022-08-04 ENCOUNTER — Other Ambulatory Visit: Payer: Self-pay | Admitting: *Deleted

## 2022-08-04 MED ORDER — NA SULFATE-K SULFATE-MG SULF 17.5-3.13-1.6 GM/177ML PO SOLN: ORAL | 0 refills | Status: DC

## 2022-08-04 NOTE — Telephone Encounter (Signed)
Pt has been scheduled for 09/03/22. Instructions mailed and prep sent to the pharmacy.   [employer] PA:  Notification or Prior Authorization is not required for the requested services You are not required to submit a notification/prior authorization based on the information provided. If you have general questions about the prior authorization requirements, visit [email or web address] > Clinician Resources > Advance and Admission Notification Requirements. The number above acknowledges your notification. Please write this reference number down for future reference. If you would like to request an organization determination, please call us at [phone]. Decision ID #: [id number]

## 2022-08-05 ENCOUNTER — Encounter: Payer: Self-pay | Admitting: *Deleted

## 2022-08-07 ENCOUNTER — Ambulatory Visit (HOSPITAL_COMMUNITY)
Admission: RE | Admit: 2022-08-07 | Discharge: 2022-08-07 | Disposition: A | Discharge status: Home / Self Care | Payer: Medicare Other | Source: Ambulatory Visit | Attending: Gastroenterology | Admitting: Gastroenterology

## 2022-08-07 DIAGNOSIS — R161 Splenomegaly, not elsewhere classified: Secondary | ICD-10-CM | POA: Insufficient documentation

## 2022-08-07 DIAGNOSIS — R772 Abnormality of alphafetoprotein: Secondary | ICD-10-CM | POA: Diagnosis not present

## 2022-08-07 DIAGNOSIS — Z139 Encounter for screening, unspecified: Secondary | ICD-10-CM | POA: Diagnosis not present

## 2022-08-07 DIAGNOSIS — K760 Fatty (change of) liver, not elsewhere classified: Secondary | ICD-10-CM | POA: Diagnosis not present

## 2022-08-19 ENCOUNTER — Encounter: Payer: Self-pay | Admitting: *Deleted

## 2022-08-19 NOTE — Telephone Encounter (Signed)
Pt had to reschedule her procedure on 09/03/22 until 10/01/22 at 8:30 am with Dr.Rourk, She said she was having some issues with family member and having to take them to appointments. New instructions mailed to pt.

## 2022-09-01 ENCOUNTER — Other Ambulatory Visit (HOSPITAL_COMMUNITY): Payer: Medicare Other

## 2022-09-05 ENCOUNTER — Telehealth: Payer: Self-pay | Admitting: Obstetrics & Gynecology

## 2022-09-05 NOTE — Telephone Encounter (Signed)
Ms. Folland called the office to cancel/reschedule her appt. In June with Dr. Tamela Oddi. Pt advised to call the office back in early June to reschedule an appt. For July.

## 2022-09-24 ENCOUNTER — Ambulatory Visit: Payer: Medicare Other | Admitting: Obstetrics & Gynecology

## 2022-09-26 NOTE — Patient Instructions (Addendum)
Destiny Villanueva  09/26/2022     @PREFPERIOPPHARMACY @   Your procedure is scheduled on  10/01/2022.   Report to [facility] at  0630 A.M.   Call this number if you have problems the morning of surgery:  [phone]  If you experience any cold or flu symptoms such as cough, fever, chills, shortness of breath, etc. between now and your scheduled surgery, please notify us at the above number.   Remember:  Follow the diet and prep instructions given to you by the office.     Use your inhaler before you come and bring your rescue inhaler with you.     Take these medicines the morning of surgery with A SIP OF WATER                                   pepcid, metoprolol.     Do not wear jewelry, make-up or nail polish, including gel polish,  artificial nails, or any other type of covering on natural nails (fingers and  toes).  Do not wear lotions, powders, or perfumes, or deodorant.  Do not shave 48 hours prior to surgery.  Men may shave face and neck.  Do not bring valuables to the hospital.  [facility] is not responsible for any belongings or valuables.  Contacts, dentures or bridgework may not be worn into surgery.  Leave your suitcase in the car.  After surgery it may be brought to your room.  For patients admitted to the hospital, discharge time will be determined by your treatment team.  Patients discharged the day of surgery will not be allowed to drive home and must have someone with them for 24 hours.    Special instructions:   DO NOT smoke tobacco or vape for 24 hours before your procedure.  Please read over the following fact sheets that you were given. Anesthesia Post-op Instructions and Care and Recovery After Surgery       Upper Endoscopy, Adult, Care After After the procedure, it is common to have a sore throat. It is also common to have: Mild stomach pain or discomfort. Bloating. Nausea. Follow these instructions at home: The instructions below  may help you care for yourself at home. Your health care provider may give you more instructions. If you have questions, ask your health care provider. If you were given a sedative during the procedure, it can affect you for several hours. Do not drive or operate machinery until your health care provider says that it is safe. If you will be going home right after the procedure, plan to have a responsible adult: Take you home from the hospital or clinic. You will not be allowed to drive. Care for you for the time you are told. Follow instructions from your health care provider about what you may eat and drink. Return to your normal activities as told by your health care provider. Ask your health care provider what activities are safe for you. Take over-the-counter and prescription medicines only as told by your health care provider. Contact a health care provider if you: Have a sore throat that lasts longer than one day. Have trouble swallowing. Have a fever. Get help right away if you: Vomit blood or your vomit looks like coffee grounds. Have bloody, black, or tarry stools. Have a very bad sore throat or you cannot swallow. Have difficulty breathing or  very bad pain in your chest or abdomen. These symptoms may be an emergency. Get help right away. Call 911. Do not wait to see if the symptoms will go away. Do not drive yourself to the hospital. Summary After the procedure, it is common to have a sore throat, mild stomach discomfort, bloating, and nausea. If you were given a sedative during the procedure, it can affect you for several hours. Do not drive until your health care provider says that it is safe. Follow instructions from your health care provider about what you may eat and drink. Return to your normal activities as told by your health care provider. This information is not intended to replace advice given to you by your health care provider. Make sure you discuss any questions you  have with your health care provider. Document Revised: 07/17/2021 Document Reviewed: 07/17/2021 Elsevier Patient Education  2024 [employer]. Colonoscopy, Adult, Care After The following information offers guidance on how to care for yourself after your procedure. Your health care provider may also give you more specific instructions. If you have problems or questions, contact your health care provider. What can I expect after the procedure? After the procedure, it is common to have: A small amount of blood in your stool for 24 hours after the procedure. Some gas. Mild cramping or bloating of your abdomen. Follow these instructions at home: Eating and drinking  Drink enough fluid to keep your urine pale yellow. Follow instructions from your health care provider about eating or drinking restrictions. Resume your normal diet as told by your health care provider. Avoid heavy or fried foods that are hard to digest. Activity Rest as told by your health care provider. Avoid sitting for a long time without moving. Get up to take short walks every 1-2 hours. This is important to improve blood flow and breathing. Ask for help if you feel weak or unsteady. Return to your normal activities as told by your health care provider. Ask your health care provider what activities are safe for you. Managing cramping and bloating  Try walking around when you have cramps or feel bloated. If directed, apply heat to your abdomen as told by your health care provider. Use the heat source that your health care provider recommends, such as a moist heat pack or a heating pad. Place a towel between your skin and the heat source. Leave the heat on for 20-30 minutes. Remove the heat if your skin turns bright red. This is especially important if you are unable to feel pain, heat, or cold. You have a greater risk of getting burned. General instructions If you were given a sedative during the procedure, it can affect you  for several hours. Do not drive or operate machinery until your health care provider says that it is safe. For the first 24 hours after the procedure: Do not sign important documents. Do not drink alcohol. Do your regular daily activities at a slower pace than normal. Eat soft foods that are easy to digest. Take over-the-counter and prescription medicines only as told by your health care provider. Keep all follow-up visits. This is important. Contact a health care provider if: You have blood in your stool 2-3 days after the procedure. Get help right away if: You have more than a small spotting of blood in your stool. You have large blood clots in your stool. You have swelling of your abdomen. You have nausea or vomiting. You have a fever. You have increasing pain in  your abdomen that is not relieved with medicine. These symptoms may be an emergency. Get help right away. Call 911. Do not wait to see if the symptoms will go away. Do not drive yourself to the hospital. Summary After the procedure, it is common to have a small amount of blood in your stool. You may also have mild cramping and bloating of your abdomen. If you were given a sedative during the procedure, it can affect you for several hours. Do not drive or operate machinery until your health care provider says that it is safe. Get help right away if you have a lot of blood in your stool, nausea or vomiting, a fever, or increased pain in your abdomen. This information is not intended to replace advice given to you by your health care provider. Make sure you discuss any questions you have with your health care provider. Document Revised: 05/20/2022 Document Reviewed: 11/28/2020 Elsevier Patient Education  2024 Elsevier Inc. Monitored Anesthesia Care, Care After The following information offers guidance on how to care for yourself after your procedure. Your health care provider may also give you more specific instructions. If you  have problems or questions, contact your health care provider. What can I expect after the procedure? After the procedure, it is common to have: Tiredness. Little or no memory about what happened during or after the procedure. Impaired judgment when it comes to making decisions. Nausea or vomiting. Some trouble with balance. Follow these instructions at home: For the time period you were told by your health care provider:  Rest. Do not participate in activities where you could fall or become injured. Do not drive or use machinery. Do not drink alcohol. Do not take sleeping pills or medicines that cause drowsiness. Do not make important decisions or sign legal documents. Do not take care of children on your own. Medicines Take over-the-counter and prescription medicines only as told by your health care provider. If you were prescribed antibiotics, take them as told by your health care provider. Do not stop using the antibiotic even if you start to feel better. Eating and drinking Follow instructions from your health care provider about what you may eat and drink. Drink enough fluid to keep your urine pale yellow. If you vomit: Drink clear fluids slowly and in small amounts as you are able. Clear fluids include water, ice chips, low-calorie sports drinks, and fruit juice that has water added to it (diluted fruit juice). Eat light and bland foods in small amounts as you are able. These foods include bananas, applesauce, rice, lean meats, toast, and crackers. General instructions  Have a responsible adult stay with you for the time you are told. It is important to have someone help care for you until you are awake and alert. If you have sleep apnea, surgery and some medicines can increase your risk for breathing problems. Follow instructions from your health care provider about wearing your sleep device: When you are sleeping. This includes during daytime naps. While taking prescription  pain medicines, sleeping medicines, or medicines that make you drowsy. Do not use any products that contain nicotine or tobacco. These products include cigarettes, chewing tobacco, and vaping devices, such as e-cigarettes. If you need help quitting, ask your health care provider. Contact a health care provider if: You feel nauseous or vomit every time you eat or drink. You feel light-headed. You are still sleepy or having trouble with balance after 24 hours. You get a rash. You have a fever.  You have redness or swelling around the IV site. Get help right away if: You have trouble breathing. You have new confusion after you get home. These symptoms may be an emergency. Get help right away. Call 911. Do not wait to see if the symptoms will go away. Do not drive yourself to the hospital. This information is not intended to replace advice given to you by your health care provider. Make sure you discuss any questions you have with your health care provider. Document Revised: 09/02/2021 Document Reviewed: 09/02/2021 Elsevier Patient Education  2024 [employer].

## 2022-09-29 ENCOUNTER — Encounter (HOSPITAL_COMMUNITY): Payer: Self-pay

## 2022-09-29 ENCOUNTER — Encounter (HOSPITAL_COMMUNITY)
Admission: RE | Admit: 2022-09-29 | Discharge: 2022-09-29 | Disposition: A | Discharge status: Home / Self Care | Payer: Medicare Other | Source: Ambulatory Visit | Attending: Internal Medicine | Admitting: Internal Medicine

## 2022-09-29 VITALS — BP 115/65 | HR 105 | Temp 97.8°F | Resp 18 | Ht 66.0 in | Wt 175.3 lb

## 2022-09-29 DIAGNOSIS — F17219 Nicotine dependence, cigarettes, with unspecified nicotine-induced disorders: Secondary | ICD-10-CM | POA: Insufficient documentation

## 2022-09-29 DIAGNOSIS — K760 Fatty (change of) liver, not elsewhere classified: Secondary | ICD-10-CM | POA: Insufficient documentation

## 2022-09-29 DIAGNOSIS — Z01818 Encounter for other preprocedural examination: Secondary | ICD-10-CM | POA: Diagnosis not present

## 2022-09-29 DIAGNOSIS — D696 Thrombocytopenia, unspecified: Secondary | ICD-10-CM | POA: Diagnosis not present

## 2022-09-29 LAB — COMPREHENSIVE METABOLIC PANEL
ALT: 20 U/L (ref 0–44)
AST: 34 U/L (ref 15–41)
Albumin: 4 g/dL (ref 3.5–5.0)
Alkaline Phosphatase: 42 U/L (ref 38–126)
Anion gap: 9 (ref 5–15)
BUN: 8 mg/dL (ref 8–23)
CO2: 24 mmol/L (ref 22–32)
Calcium: 8.8 mg/dL — ABNORMAL LOW (ref 8.9–10.3)
Chloride: 101 mmol/L (ref 98–111)
Creatinine, Ser: 0.78 mg/dL (ref 0.44–1.00)
GFR, Estimated: >60 mL/min (ref >60)
Glucose, Bld: 149 mg/dL — ABNORMAL HIGH (ref 70–99)
Potassium: 3.6 mmol/L (ref 3.5–5.1)
Sodium: 134 mmol/L — ABNORMAL LOW (ref 135–145)
Total Bilirubin: 0.6 mg/dL (ref 0.3–1.2)
Total Protein: 6.3 g/dL — ABNORMAL LOW (ref 6.5–8.1)

## 2022-09-29 LAB — CBC WITH DIFFERENTIAL/PLATELET
Abs Immature Granulocytes: 0.01 10*3/uL (ref 0.00–0.07)
Basophils Absolute: 0 10*3/uL (ref 0.0–0.1)
Basophils Relative: 1 %
Eosinophils Absolute: 0.1 10*3/uL (ref 0.0–0.5)
Eosinophils Relative: 2 %
HCT: 38.4 % (ref 36.0–46.0)
Hemoglobin: 12.5 g/dL (ref 12.0–15.0)
Immature Granulocytes: 0 %
Lymphocytes Relative: 25 %
Lymphs Abs: 0.9 10*3/uL (ref 0.7–4.0)
MCH: 27.8 pg (ref 26.0–34.0)
MCHC: 32.6 g/dL (ref 30.0–36.0)
MCV: 85.5 fL (ref 80.0–100.0)
Monocytes Absolute: 0.2 10*3/uL (ref 0.1–1.0)
Monocytes Relative: 7 %
Neutro Abs: 2.3 10*3/uL (ref 1.7–7.7)
Neutrophils Relative %: 65 %
Platelets: 88 10*3/uL — ABNORMAL LOW (ref 150–400)
RBC: 4.49 MIL/uL (ref 3.87–5.11)
RDW: 13.8 % (ref 11.5–15.5)
WBC: 3.5 10*3/uL — ABNORMAL LOW (ref 4.0–10.5)
nRBC: 0 % (ref 0.0–0.2)

## 2022-09-29 LAB — PROTIME-INR
INR: 1 (ref 0.8–1.2)
Prothrombin Time: 13.4 seconds (ref 11.4–15.2)

## 2022-10-01 ENCOUNTER — Ambulatory Visit (HOSPITAL_COMMUNITY)
Admission: RE | Admit: 2022-10-01 | Discharge: 2022-10-01 | Disposition: A | Discharge status: Home / Self Care | Payer: Medicare Other | Attending: Internal Medicine | Admitting: Internal Medicine

## 2022-10-01 ENCOUNTER — Ambulatory Visit (HOSPITAL_BASED_OUTPATIENT_CLINIC_OR_DEPARTMENT_OTHER): Payer: Medicare Other | Admitting: Anesthesiology

## 2022-10-01 ENCOUNTER — Ambulatory Visit (HOSPITAL_COMMUNITY): Payer: Medicare Other | Admitting: Anesthesiology

## 2022-10-01 ENCOUNTER — Encounter (HOSPITAL_COMMUNITY)
Admission: RE | Disposition: A | Discharge status: Home / Self Care | Payer: Self-pay | Source: Home / Self Care | Attending: Internal Medicine

## 2022-10-01 ENCOUNTER — Encounter (HOSPITAL_COMMUNITY): Payer: Self-pay | Admitting: Internal Medicine

## 2022-10-01 DIAGNOSIS — K222 Esophageal obstruction: Secondary | ICD-10-CM

## 2022-10-01 DIAGNOSIS — F419 Anxiety disorder, unspecified: Secondary | ICD-10-CM | POA: Insufficient documentation

## 2022-10-01 DIAGNOSIS — J439 Emphysema, unspecified: Secondary | ICD-10-CM | POA: Insufficient documentation

## 2022-10-01 DIAGNOSIS — F17210 Nicotine dependence, cigarettes, uncomplicated: Secondary | ICD-10-CM | POA: Insufficient documentation

## 2022-10-01 DIAGNOSIS — G4730 Sleep apnea, unspecified: Secondary | ICD-10-CM | POA: Diagnosis not present

## 2022-10-01 DIAGNOSIS — F17200 Nicotine dependence, unspecified, uncomplicated: Secondary | ICD-10-CM | POA: Diagnosis not present

## 2022-10-01 DIAGNOSIS — Z87442 Personal history of urinary calculi: Secondary | ICD-10-CM | POA: Diagnosis not present

## 2022-10-01 DIAGNOSIS — F32A Depression, unspecified: Secondary | ICD-10-CM | POA: Diagnosis not present

## 2022-10-01 DIAGNOSIS — I2510 Atherosclerotic heart disease of native coronary artery without angina pectoris: Secondary | ICD-10-CM | POA: Diagnosis not present

## 2022-10-01 DIAGNOSIS — R12 Heartburn: Secondary | ICD-10-CM | POA: Diagnosis not present

## 2022-10-01 DIAGNOSIS — Z86010 Personal history of colonic polyps: Secondary | ICD-10-CM

## 2022-10-01 DIAGNOSIS — K7460 Unspecified cirrhosis of liver: Secondary | ICD-10-CM | POA: Insufficient documentation

## 2022-10-01 DIAGNOSIS — M1990 Unspecified osteoarthritis, unspecified site: Secondary | ICD-10-CM | POA: Diagnosis not present

## 2022-10-01 DIAGNOSIS — J449 Chronic obstructive pulmonary disease, unspecified: Secondary | ICD-10-CM

## 2022-10-01 DIAGNOSIS — Z79899 Other long term (current) drug therapy: Secondary | ICD-10-CM | POA: Diagnosis not present

## 2022-10-01 DIAGNOSIS — Z09 Encounter for follow-up examination after completed treatment for conditions other than malignant neoplasm: Secondary | ICD-10-CM

## 2022-10-01 DIAGNOSIS — Z1211 Encounter for screening for malignant neoplasm of colon: Secondary | ICD-10-CM | POA: Insufficient documentation

## 2022-10-01 DIAGNOSIS — K219 Gastro-esophageal reflux disease without esophagitis: Secondary | ICD-10-CM | POA: Diagnosis not present

## 2022-10-01 DIAGNOSIS — K449 Diaphragmatic hernia without obstruction or gangrene: Secondary | ICD-10-CM | POA: Insufficient documentation

## 2022-10-01 DIAGNOSIS — K760 Fatty (change of) liver, not elsewhere classified: Secondary | ICD-10-CM

## 2022-10-01 HISTORY — PX: ESOPHAGOGASTRODUODENOSCOPY (EGD) WITH PROPOFOL: SHX5813

## 2022-10-01 HISTORY — PX: COLONOSCOPY WITH PROPOFOL: SHX5780

## 2022-10-01 SURGERY — COLONOSCOPY WITH PROPOFOL
Anesthesia: General

## 2022-10-01 MED ORDER — ONDANSETRON HCL 4 MG/5ML PO SOLN
4.0000 mg | Freq: Once | ORAL | Status: DC
  Filled 2022-10-01: qty 5

## 2022-10-01 MED ORDER — ONDANSETRON HCL 4 MG/2ML IJ SOLN
INTRAMUSCULAR | Status: AC
  Administered 2022-10-01: 4 mg via INTRAVENOUS
  Filled 2022-10-01: qty 2

## 2022-10-01 MED ORDER — LACTATED RINGERS IV SOLN: INTRAVENOUS | Status: DC

## 2022-10-01 MED ORDER — ONDANSETRON HCL 4 MG/2ML IJ SOLN: 4.0000 mg | Freq: Once | INTRAMUSCULAR | Status: AC

## 2022-10-01 MED ORDER — LIDOCAINE HCL 1 % IJ SOLN
INTRAMUSCULAR | Status: DC | PRN
  Administered 2022-10-01: 50 mg via INTRADERMAL

## 2022-10-01 MED ORDER — STERILE WATER FOR IRRIGATION IR SOLN
Status: DC | PRN
  Administered 2022-10-01: .6 mL

## 2022-10-01 MED ORDER — PROPOFOL 10 MG/ML IV BOLUS
INTRAVENOUS | Status: DC | PRN
  Administered 2022-10-01 (×2): 20 mg via INTRAVENOUS
  Administered 2022-10-01: 100 mg via INTRAVENOUS
  Administered 2022-10-01: 50 mg via INTRAVENOUS

## 2022-10-01 MED ORDER — PROPOFOL 500 MG/50ML IV EMUL
INTRAVENOUS | Status: DC | PRN
  Administered 2022-10-01: 150 ug/kg/min via INTRAVENOUS

## 2022-10-01 MED ORDER — IPRATROPIUM-ALBUTEROL 0.5-2.5 (3) MG/3ML IN SOLN
RESPIRATORY_TRACT | Status: AC
  Administered 2022-10-01: 3 mL
  Filled 2022-10-01: qty 3

## 2022-10-01 MED ORDER — IPRATROPIUM-ALBUTEROL 0.5-2.5 (3) MG/3ML IN SOLN: 3.0000 mL | Freq: Once | RESPIRATORY_TRACT | Status: DC

## 2022-10-01 NOTE — Transfer of Care (Signed)
Immediate Anesthesia Transfer of Care Note  Patient: Destiny Villanueva  Procedure(s) Performed: COLONOSCOPY WITH PROPOFOL ESOPHAGOGASTRODUODENOSCOPY (EGD) WITH PROPOFOL  Patient Location: Short Stay  Anesthesia Type:General  Level of Consciousness: awake  Airway & Oxygen Therapy: Patient Spontanous Breathing  Post-op Assessment: Report given to RN  Post vital signs: stable  Last Vitals:  Vitals Value Taken Time  BP 99/60 10/01/22 0921  Temp    Pulse    Resp    SpO2 84 % 10/01/22 0921    Last Pain:  Vitals:   10/01/22 0921  TempSrc:   PainSc: 0-No pain         Complications: Will monitor respiratory status to assure sats improved before discharge.No notable events documented.

## 2022-10-01 NOTE — Discharge Instructions (Signed)
EGD Discharge instructions Please read the instructions outlined below and refer to this sheet in the next few weeks. These discharge instructions provide you with general information on caring for yourself after you leave the hospital. Your doctor may also give you specific instructions. While your treatment has been planned according to the most current medical practices available, unavoidable complications occasionally occur. If you have any problems or questions after discharge, please call your doctor. ACTIVITY You may resume your regular activity but move at a slower pace for the next 24 hours.  Take frequent rest periods for the next 24 hours.  Walking will help expel (get rid of) the air and reduce the bloated feeling in your abdomen.  No driving for 24 hours (because of the anesthesia (medicine) used during the test).  You may shower.  Do not sign any important legal documents or operate any machinery for 24 hours (because of the anesthesia used during the test).  NUTRITION Drink plenty of fluids.  You may resume your normal diet.  Begin with a light meal and progress to your normal diet.  Avoid alcoholic beverages for 24 hours or as instructed by your caregiver.  MEDICATIONS You may resume your normal medications unless your caregiver tells you otherwise.  WHAT YOU CAN EXPECT TODAY You may experience abdominal discomfort such as a feeling of fullness or "gas" pains.  FOLLOW-UP Your doctor will discuss the results of your test with you.  SEEK IMMEDIATE MEDICAL ATTENTION IF ANY OF THE FOLLOWING OCCUR: Excessive nausea (feeling sick to your stomach) and/or vomiting.  Severe abdominal pain and distention (swelling).  Trouble swallowing.  Temperature over 101 F (37.8 C).  Rectal bleeding or vomiting of blood.    Colonoscopy Discharge Instructions  Read the instructions outlined below and refer to this sheet in the next few weeks. These discharge instructions provide you with  general information on caring for yourself after you leave the hospital. Your doctor may also give you specific instructions. While your treatment has been planned according to the most current medical practices available, unavoidable complications occasionally occur. If you have any problems or questions after discharge, call Dr. Gala Romney at [phone]. ACTIVITY You may resume your regular activity, but move at a slower pace for the next 24 hours.  Take frequent rest periods for the next 24 hours.  Walking will help get rid of the air and reduce the bloated feeling in your belly (abdomen).  No driving for 24 hours (because of the medicine (anesthesia) used during the test).   Do not sign any important legal documents or operate any machinery for 24 hours (because of the anesthesia used during the test).  NUTRITION Drink plenty of fluids.  You may resume your normal diet as instructed by your doctor.  Begin with a light meal and progress to your normal diet. Heavy or fried foods are harder to digest and may make you feel sick to your stomach (nauseated).  Avoid alcoholic beverages for 24 hours or as instructed.  MEDICATIONS You may resume your normal medications unless your doctor tells you otherwise.  WHAT YOU CAN EXPECT TODAY Some feelings of bloating in the abdomen.  Passage of more gas than usual.  Spotting of blood in your stool or on the toilet paper.  IF YOU HAD POLYPS REMOVED DURING THE COLONOSCOPY: No aspirin products for 7 days or as instructed.  No alcohol for 7 days or as instructed.  Eat a soft diet for the next 24 hours.  FINDING  OUT THE RESULTS OF YOUR TEST Not all test results are available during your visit. If your test results are not back during the visit, make an appointment with your caregiver to find out the results. Do not assume everything is normal if you have not heard from your caregiver or the medical facility. It is important for you to follow up on all of your test  results.  SEEK IMMEDIATE MEDICAL ATTENTION IF: You have more than a spotting of blood in your stool.  Your belly is swollen (abdominal distention).  You are nauseated or vomiting.  You have a temperature over 101.  You have abdominal pain or discomfort that is severe or gets worse throughout the day.     No esophageal varices.    No polyps found today!    Stop famotidine; begin Protonix or pantoprazole 40 mg once daily 30 minutes before breakfast.  New prescription called to the pharmacy from my office.  Office visit with [place] in 3 months [place].    At patient request, I called Quadasia Newsham [phone] reviewed findings and recommendations
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
## 2022-10-01 NOTE — Anesthesia Preprocedure Evaluation (Addendum)
Anesthesia Evaluation  Patient identified by MRN, date of birth, ID band Patient awake    Reviewed: Allergy & Precautions, H&P , NPO status , Patient's Chart, lab work & pertinent test results, reviewed documented beta blocker date and time   History of Anesthesia Complications (+) PONV and history of anesthetic complications  Airway Mallampati: II  TM Distance: >3 FB Neck ROM: Full    Dental  (+) Edentulous Upper, Edentulous Lower   Pulmonary sleep apnea , COPD,  COPD inhaler, Current Smoker and Patient abstained from smoking.   Pulmonary exam normal breath sounds clear to auscultation       Cardiovascular (-) hypertensionPt. on home beta blockers + CAD  Normal cardiovascular exam Rhythm:Regular Rate:Normal     Neuro/Psych  PSYCHIATRIC DISORDERS Anxiety Depression    negative neurological ROS     GI/Hepatic ,GERD  Medicated, Controlled and Poorly Controlled,,(+) Cirrhosis  (spleenomegaly)        Endo/Other  negative endocrine ROS    Renal/GU Renal disease (stones)  negative genitourinary   Musculoskeletal  (+) Arthritis , Osteoarthritis,    Abdominal   Peds negative pediatric ROS (+)  Hematology  (+) Blood dyscrasia (thrombocytopenia)   Anesthesia Other Findings Spinal stenosis  Reproductive/Obstetrics negative OB ROS                             Anesthesia Physical Anesthesia Plan  ASA: 3  Anesthesia Plan: General   Post-op Pain Management: Minimal or no pain anticipated   Induction: Intravenous  PONV Risk Score and Plan: 1 and Treatment may vary due to age or medical condition  Airway Management Planned: Nasal Cannula and Natural Airway  Additional Equipment:   Intra-op Plan:   Post-operative Plan:   Informed Consent: I have reviewed the patients History and Physical, chart, labs and discussed the procedure including the risks, benefits and alternatives for the  proposed anesthesia with the patient or authorized representative who has indicated his/her understanding and acceptance.     Dental advisory given  Plan Discussed with: CRNA and Surgeon  Anesthesia Plan Comments:        Anesthesia Quick Evaluation

## 2022-10-01 NOTE — Anesthesia Postprocedure Evaluation (Signed)
Anesthesia Post Note  Patient: Destiny Villanueva  Procedure(s) Performed: COLONOSCOPY WITH PROPOFOL ESOPHAGOGASTRODUODENOSCOPY (EGD) WITH PROPOFOL  Patient location during evaluation: Phase II Anesthesia Type: General Level of consciousness: awake and alert and oriented Pain management: pain level controlled Vital Signs Assessment: post-procedure vital signs reviewed and stable Respiratory status: spontaneous breathing, nonlabored ventilation, respiratory function stable and patient connected to nasal cannula oxygen Cardiovascular status: blood pressure returned to baseline and stable Postop Assessment: no apparent nausea or vomiting Anesthetic complications: no Comments: Patient desaturated during colonoscopy, duoneb nebulizer treatment in phase 2. Lungs CTA. Oxygen saturations in upper 90s after treatment.  No notable events documented.   Last Vitals:  Vitals:   10/01/22 0922 10/01/22 0934  BP: 102/65   Pulse:    Resp: 13   Temp:    SpO2: 92% 100%    Last Pain:  Vitals:   10/01/22 0922  TempSrc:   PainSc: 0-No pain                 Shanele Nissan C Chrishawn Boley

## 2022-10-01 NOTE — Op Note (Signed)
[facility] Patient Name: Destiny Villanueva Procedure Date: 10/01/2022 8:26 AM MRN: [id number] Date of Birth: 1960/08/18 Attending MD: Gennette Pac , MD, [id number] CSN: [id number] Age: 62 Admit Type: Outpatient Procedure:                Colonoscopy Indications:              High risk colon cancer surveillance: Personal                            history of colonic polyps Providers:                Gennette Pac, MD, Jannett Celestine, RN, Lennice Sites Technician, Technician Referring MD:              Medicines:                Propofol per Anesthesia Complications:            No immediate complications. Estimated Blood Loss:     Estimated blood loss: none. Procedure:                Pre-Anesthesia Assessment:                           - Prior to the procedure, a History and Physical                            was performed, and patient medications and                            allergies were reviewed. The patient's tolerance of                            previous anesthesia was also reviewed. The risks                            and benefits of the procedure and the sedation                            options and risks were discussed with the patient.                            All questions were answered, and informed consent                            was obtained. Prior Anticoagulants: The patient has                            taken no anticoagulant or antiplatelet agents. ASA                            Grade Assessment: III - A patient with severe  systemic disease. After reviewing the risks and                            benefits, the patient was deemed in satisfactory                            condition to undergo the procedure.                           After obtaining informed consent, the colonoscope                            was passed under direct vision. Throughout the                            procedure,  the patient's blood pressure, pulse, and                            oxygen saturations were monitored continuously. The                            [id number]) scope was introduced through the                            anus and advanced to the the cecum, identified by                            appendiceal orifice and ileocecal valve. The                            colonoscopy was performed without difficulty. The                            patient tolerated the procedure well. The quality                            of the bowel preparation was adequate. The                            ileocecal valve, appendiceal orifice, and rectum                            were photographed. Scope In: 8:48:31 AM Scope Out: 9:04:15 AM Scope Withdrawal Time: 0 hours 6 minutes 1 second  Total Procedure Duration: 0 hours 15 minutes 44 seconds  Findings:      The perianal and digital rectal examinations were normal.      The colon (entire examined portion) appeared normal.      The retroflexed view of the distal rectum and anal verge was normal and       showed no anal or rectal abnormalities. Impression:               - The entire examined colon is normal.                           -  The distal rectum and anal verge are normal on                            retroflexion view.                           - No specimens collected. Moderate Sedation:      Moderate (conscious) sedation was personally administered by an       anesthesia professional. The following parameters were monitored: oxygen       saturation, heart rate, blood pressure, respiratory rate, EKG, adequacy       of pulmonary ventilation, and response to care. Recommendation:           - Patient has a contact number available for                            emergencies. The signs and symptoms of potential                            delayed complications were discussed with the                            patient. Return to normal  activities tomorrow.                            Written discharge instructions were provided to the                            patient.                           - Advance diet as tolerated.                           - Continue present medications.                           - Repeat colonoscopy in 5 years for surveillance.                           - Return to GI office in 3 months. See EGD report. Procedure Code(s):        --- Professional ---                           [phone], Colonoscopy, flexible; diagnostic, including                            collection of specimen(s) by brushing or washing,                            when performed (separate procedure) Diagnosis Code(s):        --- Professional ---                           Z86.010, Personal history of colonic polyps CPT copyright 2022 [employer]. All rights reserved.  The codes documented in this report are preliminary and upon coder review may  be revised to meet current compliance requirements. Gerrit Friends. Dajion Bickford, MD Gennette Pac, MD 10/01/2022 9:21:55 AM This report has been signed electronically. Number of Addenda: 0

## 2022-10-01 NOTE — Op Note (Signed)
[facility] Patient Name: Destiny Villanueva Procedure Date: 10/01/2022 8:09 AM MRN: [id number] Date of Birth: 01/20/1961 Attending MD: Gennette Pac , MD, [id number] CSN: [id number] Age: 63 Admit Type: Outpatient Procedure:                Upper GI endoscopy Indications:              Heartburn Providers:                Gennette Pac, MD, Jannett Celestine, RN, Lennice Sites Technician, Technician Referring MD:              Medicines:                Propofol per Anesthesia Complications:            No immediate complications. Estimated Blood Loss:     Estimated blood loss: none. Procedure:                Pre-Anesthesia Assessment:                           - Prior to the procedure, a History and Physical                            was performed, and patient medications and                            allergies were reviewed. The patient's tolerance of                            previous anesthesia was also reviewed. The risks                            and benefits of the procedure and the sedation                            options and risks were discussed with the patient.                            All questions were answered, and informed consent                            was obtained. Prior Anticoagulants: The patient has                            taken no anticoagulant or antiplatelet agents. ASA                            Grade Assessment: III - A patient with severe                            systemic disease. After reviewing the risks and  benefits, the patient was deemed in satisfactory                            condition to undergo the procedure.                           After obtaining informed consent, the endoscope was                            passed under direct vision. Throughout the                            procedure, the patient's blood pressure, pulse, and                            oxygen  saturations were monitored continuously. The                            [id number]-[id number] ([id number]) scope was introduced through the                            mouth, and advanced to the second part of duodenum.                            The upper GI endoscopy was accomplished without                            difficulty. The patient tolerated the procedure                            well. Scope In: 8:37:54 AM Scope Out: 8:43:18 AM Total Procedure Duration: 0 hours 5 minutes 24 seconds  Findings:      A mild Schatzki ring was found at the gastroesophageal junction. No       esophagitis. No varices. Gastric cavity empty.      A hiatal hernia was present. Normal-appearing gastric mucosa patent       pylorus.      The duodenal bulb and second portion of the duodenum were normal. Impression:               - Mild Schatzki ring. No varices.                           - Hiatal hernia.                           - Normal duodenal bulb and second portion of the                            duodenum.                           - No specimens collected. Moderate Sedation:      Moderate (conscious) sedation was personally administered by an       anesthesia professional. The following parameters were monitored: oxygen  saturation, heart rate, blood pressure, respiratory rate, EKG, adequacy       of pulmonary ventilation, and response to care. Recommendation:           - Patient has a contact number available for                            emergencies. The signs and symptoms of potential                            delayed complications were discussed with the                            patient. Return to normal activities tomorrow.                            Written discharge instructions were provided to the                            patient.                           - Advance diet as tolerated.                           - Continue present medications.                           -Repeat EGD for  variceal screening in 2 to 3 years.                            See colonoscopy report office visit in 3 months Procedure Code(s):        --- Professional ---                           [id number], Esophagogastroduodenoscopy, flexible,                            transoral; diagnostic, including collection of                            specimen(s) by brushing or washing, when performed                            (separate procedure) Diagnosis Code(s):        --- Professional ---                           K22.2, Esophageal obstruction                           K44.9, Diaphragmatic hernia without obstruction or                            gangrene                           R12,  Heartburn CPT copyright 2022 [employer]. All rights reserved. The codes documented in this report are preliminary and upon coder review may  be revised to meet current compliance requirements. Gerrit Friends. Haleigh Desmith, MD Gennette Pac, MD 10/01/2022 9:18:53 AM This report has been signed electronically. Number of Addenda: 0

## 2022-10-03 ENCOUNTER — Other Ambulatory Visit: Payer: Self-pay

## 2022-10-03 MED ORDER — PANTOPRAZOLE SODIUM 40 MG PO TBEC: 40.0000 mg | DELAYED_RELEASE_TABLET | Freq: Every day | ORAL | 11 refills | Status: DC

## 2022-10-08 ENCOUNTER — Encounter (HOSPITAL_COMMUNITY): Payer: Self-pay | Admitting: Internal Medicine

## 2022-10-14 DIAGNOSIS — Z299 Encounter for prophylactic measures, unspecified: Secondary | ICD-10-CM | POA: Diagnosis not present

## 2022-10-14 DIAGNOSIS — R202 Paresthesia of skin: Secondary | ICD-10-CM | POA: Diagnosis not present

## 2022-10-14 DIAGNOSIS — F17210 Nicotine dependence, cigarettes, uncomplicated: Secondary | ICD-10-CM | POA: Diagnosis not present

## 2022-10-14 DIAGNOSIS — R200 Anesthesia of skin: Secondary | ICD-10-CM | POA: Diagnosis not present

## 2022-10-14 DIAGNOSIS — J449 Chronic obstructive pulmonary disease, unspecified: Secondary | ICD-10-CM | POA: Diagnosis not present

## 2022-10-17 DIAGNOSIS — E538 Deficiency of other specified B group vitamins: Secondary | ICD-10-CM | POA: Diagnosis not present

## 2022-10-24 DIAGNOSIS — E538 Deficiency of other specified B group vitamins: Secondary | ICD-10-CM | POA: Diagnosis not present

## 2022-10-31 DIAGNOSIS — E538 Deficiency of other specified B group vitamins: Secondary | ICD-10-CM | POA: Diagnosis not present

## 2022-11-07 DIAGNOSIS — E538 Deficiency of other specified B group vitamins: Secondary | ICD-10-CM | POA: Diagnosis not present

## 2022-11-18 DIAGNOSIS — I25118 Atherosclerotic heart disease of native coronary artery with other forms of angina pectoris: Secondary | ICD-10-CM | POA: Diagnosis not present

## 2022-11-18 DIAGNOSIS — E782 Mixed hyperlipidemia: Secondary | ICD-10-CM | POA: Diagnosis not present

## 2022-11-25 DIAGNOSIS — J449 Chronic obstructive pulmonary disease, unspecified: Secondary | ICD-10-CM | POA: Diagnosis not present

## 2022-11-25 DIAGNOSIS — Z299 Encounter for prophylactic measures, unspecified: Secondary | ICD-10-CM | POA: Diagnosis not present

## 2022-11-25 DIAGNOSIS — L03116 Cellulitis of left lower limb: Secondary | ICD-10-CM | POA: Diagnosis not present

## 2022-11-25 DIAGNOSIS — F17210 Nicotine dependence, cigarettes, uncomplicated: Secondary | ICD-10-CM | POA: Diagnosis not present

## 2022-12-04 ENCOUNTER — Encounter: Payer: Self-pay | Admitting: Gastroenterology

## 2022-12-04 DIAGNOSIS — M21862 Other specified acquired deformities of left lower leg: Secondary | ICD-10-CM | POA: Diagnosis not present

## 2022-12-04 DIAGNOSIS — Z299 Encounter for prophylactic measures, unspecified: Secondary | ICD-10-CM | POA: Diagnosis not present

## 2022-12-04 DIAGNOSIS — M1712 Unilateral primary osteoarthritis, left knee: Secondary | ICD-10-CM | POA: Diagnosis not present

## 2022-12-04 DIAGNOSIS — Z8781 Personal history of (healed) traumatic fracture: Secondary | ICD-10-CM | POA: Diagnosis not present

## 2022-12-04 DIAGNOSIS — L03116 Cellulitis of left lower limb: Secondary | ICD-10-CM | POA: Diagnosis not present

## 2022-12-04 DIAGNOSIS — S72402D Unspecified fracture of lower end of left femur, subsequent encounter for closed fracture with routine healing: Secondary | ICD-10-CM | POA: Diagnosis not present

## 2022-12-04 DIAGNOSIS — M25652 Stiffness of left hip, not elsewhere classified: Secondary | ICD-10-CM | POA: Diagnosis not present

## 2022-12-04 DIAGNOSIS — M25562 Pain in left knee: Secondary | ICD-10-CM | POA: Diagnosis not present

## 2022-12-10 DIAGNOSIS — E538 Deficiency of other specified B group vitamins: Secondary | ICD-10-CM | POA: Diagnosis not present

## 2022-12-30 ENCOUNTER — Ambulatory Visit: Payer: Medicare Other | Admitting: Gastroenterology

## 2023-01-02 ENCOUNTER — Ambulatory Visit: Payer: Medicare Other | Admitting: Gastroenterology
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
## 2023-01-05 ENCOUNTER — Encounter: Payer: Self-pay | Admitting: Gastroenterology

## 2023-01-13 DIAGNOSIS — E538 Deficiency of other specified B group vitamins: Secondary | ICD-10-CM | POA: Diagnosis not present

## 2023-01-13 DIAGNOSIS — Z23 Encounter for immunization: Secondary | ICD-10-CM | POA: Diagnosis not present

## 2023-01-14 ENCOUNTER — Encounter: Payer: Self-pay | Admitting: *Deleted

## 2023-01-14 ENCOUNTER — Ambulatory Visit: Payer: Medicare Other | Admitting: Gastroenterology

## 2023-01-14 ENCOUNTER — Encounter: Payer: Self-pay | Admitting: Gastroenterology

## 2023-01-14 VITALS — BP 113/73 | HR 91 | Temp 98.5°F | Ht 66.0 in | Wt 173.8 lb

## 2023-01-14 DIAGNOSIS — K7460 Unspecified cirrhosis of liver: Secondary | ICD-10-CM | POA: Diagnosis not present

## 2023-01-14 DIAGNOSIS — K219 Gastro-esophageal reflux disease without esophagitis: Secondary | ICD-10-CM | POA: Diagnosis not present

## 2023-01-14 DIAGNOSIS — K760 Fatty (change of) liver, not elsewhere classified: Secondary | ICD-10-CM | POA: Diagnosis not present

## 2023-01-14 DIAGNOSIS — R197 Diarrhea, unspecified: Secondary | ICD-10-CM | POA: Insufficient documentation

## 2023-01-14 DIAGNOSIS — A09 Infectious gastroenteritis and colitis, unspecified: Secondary | ICD-10-CM

## 2023-01-14 NOTE — Progress Notes (Signed)
GI Office Note    Referring Provider: Ignatius Specking, MD Primary Care Physician:  Ignatius Specking, MD  Primary Gastroenterologist: Roetta Sessions, MD   Chief Complaint   Chief Complaint  Patient presents with   Follow-up    Having issues with diarrhea states that it has been going on since around the time she started the pantoprazole and is wondering if that is causing it    History of Present Illness   Destiny Villanueva is a 62 y.o. female presenting today for follow-up.  Last seen in April 2024.  She has a history of adenomatous colon polyps, GERD, fatty liver with splenomegaly/thrombocytopenia, history of elevated AFP.  Followed by hematology for chronic thrombocytopenia/splenomegaly.  Ultrasound in January 2023 with concern for cirrhosis.  She had mildly elevated AFP followed by MRI (07/2021) with no evidence of hepatic mass or MRI evidence of cirrhotic liver but she did have steatosis and splenomegaly. U/S 01/2022 with splenomegaly/fatty liver.   She may have early cirrhosis, noted preserved liver function with MELD Na of 6 07/2022.  Plans for cirrhosis care.  She received one shot of Hep A/B vaccine, labs recently showed she is immune to hepatitis A but not Hep B. States pharmacy needs prior authorization to get Hep B approved.  Right upper quadrant ultrasound April 2024: Increased echotexture of the liver which can be seen with fatty liver  Labs from April 2024: AFP 7.4, hepatitis B surface antibody negative, total hepatitis A antibody reactive, INR 1, creatinine 0.85, sodium 137, albumin 4.1, total bilirubin 0.6, alk phos 41, AST 32, ALT 19, hemoglobin 13.7, platelets clumped.  MELD sodium of 6.  Today: Reflux well controlled on pantoprazole. She wonders however if it is causing her diarrhea. Since June, she has had more diarrhea than usual. Now persistent without solid stools. No nocturnal stools. No melena, brbpr. No abdominal pain. Stools are postprandial. Typically 1-3 per  day. Stools light brown in color. No recent abx. Recently started on B12 shots. Has a history of alternating constipation/diarrhea but typically diarrhea not persistent like now.  EGD June 2024: -Mild Schatzki ring -No varices -Hiatal hernia -Normal duodenal bulb and second portion of duodenum -repeat EGD in 2-3 years  Colonoscopy due 2024: -Entire examined colon normal -Distal rectum and anal verge normal -No specimens collected -Repeat colonoscopy in 5 years    Medications   Current Outpatient Medications  Medication Sig Dispense Refill   albuterol (PROVENTIL HFA;VENTOLIN HFA) 108 (90 Base) MCG/ACT inhaler Inhale 2 puffs into the lungs every 6 (six) hours as needed for wheezing or shortness of breath. 3 Inhaler 0   calcium carbonate (OSCAL) 1500 (600 Ca) MG TABS tablet Take 600 mg of elemental calcium by mouth daily with breakfast.     Cholecalciferol (VITAMIN D3) 50 MCG (2000 UT) capsule Take 2,000 Units by mouth in the morning.     citalopram (CELEXA) 40 MG tablet Take 40 mg by mouth at bedtime.     cyanocobalamin (VITAMIN B12) 1000 MCG/ML injection Inject into the muscle.     metoprolol succinate (TOPROL-XL) 25 MG 24 hr tablet Take 12.5 mg by mouth in the morning.     naproxen sodium (ALEVE) 220 MG tablet Take 220 mg by mouth 2 (two) times daily as needed (pain.).     nitroGLYCERIN (NITROSTAT) 0.4 MG SL tablet Place 0.4 mg under the tongue every 5 (five) minutes x 3 doses as needed for chest pain.     pantoprazole (PROTONIX) 40  MG tablet Take 1 tablet (40 mg total) by mouth daily. 30 tablet 11   rosuvastatin (CRESTOR) 5 MG tablet Take 5 mg by mouth at bedtime.     No current facility-administered medications for this visit.    Allergies   Allergies as of 01/14/2023 - Review Complete 01/14/2023  Allergen Reaction Noted   Aspirin Nausea And Vomiting 07/03/2014   Ciprofloxacin  07/03/2014   Hydrocodone Hives 03/27/2021   Penicillins Hives 07/03/2014   Fluocinolone  Dermatitis 07/18/2013   Fosamax [alendronate] Other (See Comments) 07/18/2013   Sulfa antibiotics Rash 02/08/2019      Review of Systems   General: Negative for anorexia, weight loss, fever, chills, fatigue, weakness. ENT: Negative for hoarseness, difficulty swallowing , nasal congestion. CV: Negative for chest pain, angina, palpitations, dyspnea on exertion, peripheral edema.  Respiratory: Negative for dyspnea at rest, dyspnea on exertion, cough, sputum, wheezing.  GI: See history of present illness. GU:  Negative for dysuria, hematuria, urinary incontinence, urinary frequency, nocturnal urination.  Endo: Negative for unusual weight change.     Physical Exam   BP 113/73 (BP Location: Right Arm, Patient Position: Sitting, Cuff Size: Normal)   Pulse 91   Temp 98.5 F (36.9 C) (Oral)   Ht 5\' 6"  (1.676 m)   Wt 173 lb 12.8 oz (78.8 kg)   SpO2 98%   BMI 28.05 kg/m    General: Well-nourished, well-developed in no acute distress.  Eyes: No icterus. Mouth: Oropharyngeal mucosa moist and pink  Lungs: Clear to auscultation bilaterally.  Heart: Regular rate and rhythm, no murmurs rubs or gallops.  Abdomen: Bowel sounds are normal, nontender, nondistended, no hepatosplenomegaly or masses,  no abdominal bruits or hernia , no rebound or guarding.  Rectal: not performed  Extremities: No lower extremity edema. No clubbing or deformities. Neuro: Alert and oriented x 4   Skin: Warm and dry, no jaundice.   Psych: Alert and cooperative, normal mood and affect.  Labs   See hpi  Imaging Studies   No results found.  Assessment   *GERD *Diarrhea *fatty liver concern for cirrhosis  Gerd better on pantoprazole daily. Now with daily diarrhea for 2 months or more. She wonders if related to pantoprazole but she is not wanting to stop the pantoprazole. No recent antibiotics. Recent colonoscopy without IBD or malignancy, was not having diarrhea at that time. If no evidence of infection, would  consider anti-diarrheal medicaiton or antispasmotic.  Due for liver labs next month along with hepatoma screening   PLAN   CBC, CMET, PT/INR, AFP next month. She plans to get with her PCP labs. Stool for GI profile and Cdiff. RUQ U/S. Continue pantoprazole 40mg  daily.    Leanna Battles. Melvyn Neth, MHS, PA-C [facility]

## 2023-01-14 NOTE — Patient Instructions (Signed)
Take labs when you go next month to see PCP. Complete stool test at [facility]. Continue pantoprazole daily before breakfast. Complete liver ultrasound.

## 2023-01-15 ENCOUNTER — Other Ambulatory Visit (HOSPITAL_COMMUNITY)
Admission: RE | Admit: 2023-01-15 | Discharge: 2023-01-15 | Disposition: A | Discharge status: Home / Self Care | Payer: Medicare Other | Source: Ambulatory Visit | Attending: Gastroenterology | Admitting: Gastroenterology

## 2023-01-15 DIAGNOSIS — A09 Infectious gastroenteritis and colitis, unspecified: Secondary | ICD-10-CM | POA: Diagnosis not present

## 2023-01-15 LAB — C DIFFICILE QUICK SCREEN W PCR REFLEX
C Diff antigen: NEGATIVE
C Diff interpretation: NOT DETECTED
C Diff toxin: NEGATIVE

## 2023-01-16 ENCOUNTER — Other Ambulatory Visit: Payer: Self-pay | Admitting: Gastroenterology

## 2023-01-16 LAB — GASTROINTESTINAL PANEL BY PCR, STOOL (REPLACES STOOL CULTURE)

## 2023-01-16 MED ORDER — DICYCLOMINE HCL 10 MG PO CAPS: 10.0000 mg | ORAL_CAPSULE | Freq: Two times a day (BID) | ORAL | 0 refills | Status: AC | PRN

## 2023-01-21 ENCOUNTER — Ambulatory Visit (HOSPITAL_COMMUNITY)
Admission: RE | Admit: 2023-01-21 | Discharge: 2023-01-21 | Disposition: A | Discharge status: Home / Self Care | Payer: Medicare Other | Source: Ambulatory Visit | Attending: Gastroenterology | Admitting: Gastroenterology

## 2023-01-21 DIAGNOSIS — Z9049 Acquired absence of other specified parts of digestive tract: Secondary | ICD-10-CM | POA: Diagnosis not present

## 2023-01-21 DIAGNOSIS — K7460 Unspecified cirrhosis of liver: Secondary | ICD-10-CM | POA: Insufficient documentation

## 2023-02-04 DIAGNOSIS — Z7189 Other specified counseling: Secondary | ICD-10-CM | POA: Diagnosis not present

## 2023-02-04 DIAGNOSIS — R5383 Other fatigue: Secondary | ICD-10-CM | POA: Diagnosis not present

## 2023-02-04 DIAGNOSIS — Z0000 Encounter for general adult medical examination without abnormal findings: Secondary | ICD-10-CM | POA: Diagnosis not present

## 2023-02-04 DIAGNOSIS — Z299 Encounter for prophylactic measures, unspecified: Secondary | ICD-10-CM | POA: Diagnosis not present

## 2023-02-04 DIAGNOSIS — F17210 Nicotine dependence, cigarettes, uncomplicated: Secondary | ICD-10-CM | POA: Diagnosis not present

## 2023-02-04 DIAGNOSIS — E7800 Pure hypercholesterolemia, unspecified: Secondary | ICD-10-CM | POA: Diagnosis not present

## 2023-02-04 DIAGNOSIS — Z79899 Other long term (current) drug therapy: Secondary | ICD-10-CM | POA: Diagnosis not present

## 2023-02-05 DIAGNOSIS — E7800 Pure hypercholesterolemia, unspecified: Secondary | ICD-10-CM | POA: Diagnosis not present

## 2023-02-05 DIAGNOSIS — Z79899 Other long term (current) drug therapy: Secondary | ICD-10-CM | POA: Diagnosis not present

## 2023-02-05 DIAGNOSIS — E538 Deficiency of other specified B group vitamins: Secondary | ICD-10-CM | POA: Diagnosis not present

## 2023-02-05 DIAGNOSIS — R5383 Other fatigue: Secondary | ICD-10-CM | POA: Diagnosis not present

## 2023-02-05 DIAGNOSIS — E559 Vitamin D deficiency, unspecified: Secondary | ICD-10-CM | POA: Diagnosis not present

## 2023-02-10 DIAGNOSIS — Z299 Encounter for prophylactic measures, unspecified: Secondary | ICD-10-CM | POA: Diagnosis not present

## 2023-02-10 DIAGNOSIS — E538 Deficiency of other specified B group vitamins: Secondary | ICD-10-CM | POA: Diagnosis not present

## 2023-02-10 DIAGNOSIS — D72819 Decreased white blood cell count, unspecified: Secondary | ICD-10-CM | POA: Diagnosis not present

## 2023-02-10 DIAGNOSIS — R739 Hyperglycemia, unspecified: Secondary | ICD-10-CM | POA: Diagnosis not present

## 2023-02-10 DIAGNOSIS — J449 Chronic obstructive pulmonary disease, unspecified: Secondary | ICD-10-CM | POA: Diagnosis not present

## 2023-02-11 ENCOUNTER — Other Ambulatory Visit (HOSPITAL_COMMUNITY)
Admission: RE | Admit: 2023-02-11 | Discharge: 2023-02-11 | Disposition: A | Discharge status: Home / Self Care | Payer: Medicare Other | Source: Ambulatory Visit | Attending: Gastroenterology | Admitting: Gastroenterology

## 2023-02-11 DIAGNOSIS — K7460 Unspecified cirrhosis of liver: Secondary | ICD-10-CM | POA: Diagnosis not present

## 2023-02-11 DIAGNOSIS — K219 Gastro-esophageal reflux disease without esophagitis: Secondary | ICD-10-CM | POA: Diagnosis not present

## 2023-02-11 DIAGNOSIS — A09 Infectious gastroenteritis and colitis, unspecified: Secondary | ICD-10-CM | POA: Insufficient documentation

## 2023-02-11 DIAGNOSIS — K760 Fatty (change of) liver, not elsewhere classified: Secondary | ICD-10-CM | POA: Insufficient documentation

## 2023-02-11 LAB — COMPREHENSIVE METABOLIC PANEL
ALT: 19 U/L (ref 0–44)
AST: 34 U/L (ref 15–41)
Albumin: 4 g/dL (ref 3.5–5.0)
Alkaline Phosphatase: 46 U/L (ref 38–126)
Anion gap: 8 (ref 5–15)
BUN: 12 mg/dL (ref 8–23)
CO2: 25 mmol/L (ref 22–32)
Calcium: 8.8 mg/dL — ABNORMAL LOW (ref 8.9–10.3)
Chloride: 104 mmol/L (ref 98–111)
Creatinine, Ser: 0.9 mg/dL (ref 0.44–1.00)
GFR, Estimated: >60 mL/min (ref >60)
Glucose, Bld: 127 mg/dL — ABNORMAL HIGH (ref 70–99)
Potassium: 3.9 mmol/L (ref 3.5–5.1)
Sodium: 137 mmol/L (ref 135–145)
Total Bilirubin: 0.7 mg/dL (ref 0.3–1.2)
Total Protein: 6.2 g/dL — ABNORMAL LOW (ref 6.5–8.1)

## 2023-02-11 LAB — CBC WITH DIFFERENTIAL/PLATELET
Abs Immature Granulocytes: 0.02 10*3/uL (ref 0.00–0.07)
Basophils Absolute: 0 10*3/uL (ref 0.0–0.1)
Basophils Relative: 1 %
Eosinophils Absolute: 0.1 10*3/uL (ref 0.0–0.5)
Eosinophils Relative: 1 %
HCT: 39.9 % (ref 36.0–46.0)
Hemoglobin: 12.4 g/dL (ref 12.0–15.0)
Immature Granulocytes: 1 %
Lymphocytes Relative: 20 %
Lymphs Abs: 0.7 10*3/uL (ref 0.7–4.0)
MCH: 26.2 pg (ref 26.0–34.0)
MCHC: 31.1 g/dL (ref 30.0–36.0)
MCV: 84.4 fL (ref 80.0–100.0)
Monocytes Absolute: 0.2 10*3/uL (ref 0.1–1.0)
Monocytes Relative: 6 %
Neutro Abs: 2.5 10*3/uL (ref 1.7–7.7)
Neutrophils Relative %: 71 %
Platelets: 88 10*3/uL — ABNORMAL LOW (ref 150–400)
RBC: 4.73 MIL/uL (ref 3.87–5.11)
RDW: 14.4 % (ref 11.5–15.5)
WBC: 3.5 10*3/uL — ABNORMAL LOW (ref 4.0–10.5)
nRBC: 0 % (ref 0.0–0.2)

## 2023-02-11 LAB — PROTIME-INR
INR: 1.1 (ref 0.8–1.2)
Prothrombin Time: 13.9 s (ref 11.4–15.2)

## 2023-02-12 LAB — AFP TUMOR MARKER: AFP, Serum, Tumor Marker: 6.4 ng/mL (ref 0.0–9.2)

## 2023-03-02 ENCOUNTER — Ambulatory Visit (HOSPITAL_COMMUNITY): Admission: RE | Admit: 2023-03-02 | Payer: Medicare Other | Source: Ambulatory Visit

## 2023-03-02 DIAGNOSIS — U071 COVID-19: Secondary | ICD-10-CM | POA: Diagnosis not present

## 2023-03-02 DIAGNOSIS — Z299 Encounter for prophylactic measures, unspecified: Secondary | ICD-10-CM | POA: Diagnosis not present

## 2023-03-02 DIAGNOSIS — R059 Cough, unspecified: Secondary | ICD-10-CM | POA: Diagnosis not present

## 2023-03-06 ENCOUNTER — Other Ambulatory Visit: Payer: Self-pay | Admitting: Emergency Medicine

## 2023-03-06 DIAGNOSIS — Z122 Encounter for screening for malignant neoplasm of respiratory organs: Secondary | ICD-10-CM

## 2023-03-06 DIAGNOSIS — F17210 Nicotine dependence, cigarettes, uncomplicated: Secondary | ICD-10-CM

## 2023-03-06 DIAGNOSIS — Z87891 Personal history of nicotine dependence: Secondary | ICD-10-CM

## 2023-03-10 DIAGNOSIS — Z299 Encounter for prophylactic measures, unspecified: Secondary | ICD-10-CM | POA: Diagnosis not present

## 2023-03-10 DIAGNOSIS — I700 Atherosclerosis of aorta: Secondary | ICD-10-CM | POA: Diagnosis not present

## 2023-03-10 DIAGNOSIS — F17210 Nicotine dependence, cigarettes, uncomplicated: Secondary | ICD-10-CM | POA: Diagnosis not present

## 2023-03-10 DIAGNOSIS — Z0000 Encounter for general adult medical examination without abnormal findings: Secondary | ICD-10-CM | POA: Diagnosis not present

## 2023-03-10 DIAGNOSIS — J449 Chronic obstructive pulmonary disease, unspecified: Secondary | ICD-10-CM | POA: Diagnosis not present

## 2023-03-11 DIAGNOSIS — E538 Deficiency of other specified B group vitamins: Secondary | ICD-10-CM | POA: Diagnosis not present

## 2023-03-13 ENCOUNTER — Inpatient Hospital Stay: Payer: Medicare Other

## 2023-03-13 ENCOUNTER — Encounter: Payer: Self-pay | Admitting: Oncology

## 2023-03-13 ENCOUNTER — Inpatient Hospital Stay: Payer: Medicare Other | Attending: Oncology | Admitting: Oncology

## 2023-03-13 ENCOUNTER — Other Ambulatory Visit: Payer: Self-pay | Admitting: Oncology

## 2023-03-13 VITALS — BP 113/85 | HR 84 | Temp 98.2°F | Resp 16 | Ht 66.0 in | Wt 171.3 lb

## 2023-03-13 DIAGNOSIS — E538 Deficiency of other specified B group vitamins: Secondary | ICD-10-CM | POA: Insufficient documentation

## 2023-03-13 DIAGNOSIS — D72819 Decreased white blood cell count, unspecified: Secondary | ICD-10-CM

## 2023-03-13 DIAGNOSIS — E611 Iron deficiency: Secondary | ICD-10-CM | POA: Diagnosis not present

## 2023-03-13 DIAGNOSIS — K7460 Unspecified cirrhosis of liver: Secondary | ICD-10-CM | POA: Insufficient documentation

## 2023-03-13 DIAGNOSIS — I10 Essential (primary) hypertension: Secondary | ICD-10-CM | POA: Insufficient documentation

## 2023-03-13 DIAGNOSIS — R161 Splenomegaly, not elsewhere classified: Secondary | ICD-10-CM | POA: Diagnosis not present

## 2023-03-13 DIAGNOSIS — K7469 Other cirrhosis of liver: Secondary | ICD-10-CM

## 2023-03-13 DIAGNOSIS — K760 Fatty (change of) liver, not elsewhere classified: Secondary | ICD-10-CM | POA: Insufficient documentation

## 2023-03-13 DIAGNOSIS — J449 Chronic obstructive pulmonary disease, unspecified: Secondary | ICD-10-CM | POA: Diagnosis not present

## 2023-03-13 DIAGNOSIS — N200 Calculus of kidney: Secondary | ICD-10-CM | POA: Insufficient documentation

## 2023-03-13 DIAGNOSIS — D696 Thrombocytopenia, unspecified: Secondary | ICD-10-CM | POA: Diagnosis not present

## 2023-03-13 DIAGNOSIS — Z720 Tobacco use: Secondary | ICD-10-CM | POA: Insufficient documentation

## 2023-03-13 DIAGNOSIS — E785 Hyperlipidemia, unspecified: Secondary | ICD-10-CM | POA: Insufficient documentation

## 2023-03-13 LAB — CBC WITH DIFFERENTIAL/PLATELET
Abs Immature Granulocytes: 0.06 10*3/uL (ref 0.00–0.07)
Basophils Absolute: 0 10*3/uL (ref 0.0–0.1)
Basophils Relative: 1 %
Eosinophils Absolute: 0.1 10*3/uL (ref 0.0–0.5)
Eosinophils Relative: 2 %
HCT: 46.1 % — ABNORMAL HIGH (ref 36.0–46.0)
Hemoglobin: 14.3 g/dL (ref 12.0–15.0)
Immature Granulocytes: 1 %
Lymphocytes Relative: 23 %
Lymphs Abs: 1 10*3/uL (ref 0.7–4.0)
MCH: 25.6 pg — ABNORMAL LOW (ref 26.0–34.0)
MCHC: 31 g/dL (ref 30.0–36.0)
MCV: 82.5 fL (ref 80.0–100.0)
Monocytes Absolute: 0.3 10*3/uL (ref 0.1–1.0)
Monocytes Relative: 7 %
Neutro Abs: 3.1 10*3/uL (ref 1.7–7.7)
Neutrophils Relative %: 66 %
Platelets: 131 10*3/uL — ABNORMAL LOW (ref 150–400)
RBC: 5.59 MIL/uL — ABNORMAL HIGH (ref 3.87–5.11)
RDW: 13.7 % (ref 11.5–15.5)
WBC: 4.6 10*3/uL (ref 4.0–10.5)
nRBC: 0 % (ref 0.0–0.2)

## 2023-03-13 LAB — RETICULOCYTES
Immature Retic Fract: 7 % (ref 2.3–15.9)
RBC.: 5.52 MIL/uL — ABNORMAL HIGH (ref 3.87–5.11)
Retic Count, Absolute: 62.9 10*3/uL (ref 19.0–186.0)
Retic Ct Pct: 1.1 % (ref 0.4–3.1)

## 2023-03-13 LAB — COMPREHENSIVE METABOLIC PANEL
ALT: 19 U/L (ref 0–44)
AST: 31 U/L (ref 15–41)
Albumin: 4.3 g/dL (ref 3.5–5.0)
Alkaline Phosphatase: 49 U/L (ref 38–126)
Anion gap: 6 (ref 5–15)
BUN: 13 mg/dL (ref 8–23)
CO2: 29 mmol/L (ref 22–32)
Calcium: 9.2 mg/dL (ref 8.9–10.3)
Chloride: 103 mmol/L (ref 98–111)
Creatinine, Ser: 0.87 mg/dL (ref 0.44–1.00)
GFR, Estimated: >60 mL/min (ref >60)
Glucose, Bld: 117 mg/dL — ABNORMAL HIGH (ref 70–99)
Potassium: 4.8 mmol/L (ref 3.5–5.1)
Sodium: 138 mmol/L (ref 135–145)
Total Bilirubin: 0.6 mg/dL (ref <1.2)
Total Protein: 6.8 g/dL (ref 6.5–8.1)

## 2023-03-13 LAB — URIC ACID: Uric Acid, Serum: 6.2 mg/dL (ref 2.5–7.1)

## 2023-03-13 LAB — VITAMIN B12: Vitamin B-12: 813 pg/mL (ref 180–914)

## 2023-03-13 LAB — IRON AND TIBC
Iron: 58 ug/dL (ref 28–170)
Saturation Ratios: 14 % (ref 10.4–31.8)
TIBC: 408 ug/dL (ref 250–450)
UIBC: 350 ug/dL

## 2023-03-13 LAB — LACTATE DEHYDROGENASE: LDH: 110 U/L (ref 98–192)

## 2023-03-13 LAB — FERRITIN: Ferritin: 27 ng/mL (ref 11–307)

## 2023-03-13 LAB — FOLATE: Folate: 7 ng/mL (ref >5.9)

## 2023-03-13 MED ORDER — FERROUS SULFATE 325 (65 FE) MG PO TBEC: 325.0000 mg | DELAYED_RELEASE_TABLET | ORAL | 3 refills | Status: DC

## 2023-03-13 NOTE — Assessment & Plan Note (Signed)
Patient always had borderline low platelet counts which decreased further recently.  Likely secondary to liver cirrhosis and splenomegaly. -Will repeat labs today along with nutritional studies -Continue to monitor for now and if at any point they go below 50 we will consider a bone marrow biopsy

## 2023-03-13 NOTE — Patient Instructions (Signed)
VISIT SUMMARY:  During today's visit, we discussed your history of low platelets and white blood cell count, liver cirrhosis, and an enlarged spleen. We also talked about your smoking habits and potential ways to quit. We have planned some follow-up tests and will continue monitoring your conditions closely.  YOUR PLAN:  -THROMBOCYTOPENIA AND LEUKOPENIA: Thrombocytopenia means you have a low platelet count, and leukopenia means you have a low white blood cell count. These can be caused by nutritional deficiencies, liver cirrhosis, or an enlarged spleen. We will order blood work to check for nutritional deficiencies and rule out leukemia.  -LIVER CIRRHOSIS: Liver cirrhosis is a condition where the liver is scarred and permanently damaged. It can result from long-term fatty liver disease. We will continue to monitor this condition with Dr. Kendell Bane.  -SPLENOMEGALY: Splenomegaly means you have an enlarged spleen, which can contribute to your low platelet count. We will continue to monitor this condition.  -TOBACCO USE: You currently smoke about a pack a day and have considered quitting. We encourage you to try nicotine gum to help you stop smoking.  INSTRUCTIONS:  Please follow up in a few weeks to discuss your lab results. If any concerning results are found, we will contact you sooner.

## 2023-03-13 NOTE — Assessment & Plan Note (Signed)
Patient smokes about a pack a day and has considered quitting. -Encouraged patient to try nicotine gum to aid in smoking cessation.

## 2023-03-13 NOTE — Assessment & Plan Note (Addendum)
Patient has mild leukopenia likely secondary to liver cirrhosis no nutritional deficiencies.  Differential diagnosis also includes leukemia -Will order flow cytometry and nutritional studies.  If nutritional deficiencies are identified will replace as needed. -HIV and hepatitis panels previously were negative -repeat labs in 6 weeks

## 2023-03-13 NOTE — Assessment & Plan Note (Signed)
History of fatty liver disease. Unclear if cirrhosis is present as imaging results have been inconsistent. -Continue monitoring with Dr. Kendell Bane.

## 2023-03-13 NOTE — Progress Notes (Addendum)
[facility] at [facility] HEMATOLOGY NEW VISIT  Destiny Villanueva C  REASON FOR REFERRAL: Leukopenia and thrombocytopenia  HISTORY OF PRESENT ILLNESS: Destiny Villanueva 62 y.o. female referred for leukopenia and thrombocytopenia.  She has a past medical history of liver cirrhosis, splenomegaly, COPD, vitamin B12 deficiency,hypertension and hyperlipidemia. She has a known history of liver cirrhosis, which she attributes to a long-standing fatty liver. She denies a history of significant alcohol consumption. She also reports an enlarged spleen, identified during an ultrasound. The patient experiences fatigue and night sweats but denies any recent weight loss. She is currently on vitamin B12 injections administered by her primary care provider. The patient reports a good appetite and is able to perform household chores without any restrictions.   She lives with her sister and husband. The patient is a current smoker, consuming about a pack a day, and has considered quitting. She has tried nicotine patches in the past but experienced severe itching. She is open to trying nicotine gum.  She does not drink alcohol.   I have reviewed the past medical history, past surgical history, social history and family history with the patient   ALLERGIES:  is allergic to aspirin, ciprofloxacin, hydrocodone, penicillins, fluocinolone, fosamax [alendronate], and sulfa antibiotics.  MEDICATIONS:  Current Outpatient Medications  Medication Sig Dispense Refill   albuterol (PROVENTIL HFA;VENTOLIN HFA) 108 (90 Base) MCG/ACT inhaler Inhale 2 puffs into the lungs every 6 (six) hours as needed for wheezing or shortness of breath. 3 Inhaler 0   calcium carbonate (OSCAL) 1500 (600 Ca) MG TABS tablet Take 600 mg of elemental calcium by mouth daily with breakfast.     Cholecalciferol (VITAMIN D3) 50 MCG (2000 UT) capsule Take 2,000 Units by mouth in the morning.     citalopram (CELEXA) 40 MG tablet  Take 40 mg by mouth at bedtime.     cyanocobalamin (VITAMIN B12) 1000 MCG/ML injection Inject into the muscle.     dicyclomine (BENTYL) 10 MG capsule Take 1 capsule (10 mg total) by mouth 2 (two) times daily as needed for spasms (loose stools). 60 capsule 0   metoprolol succinate (TOPROL-XL) 25 MG 24 hr tablet Take 12.5 mg by mouth in the morning.     naproxen sodium (ALEVE) 220 MG tablet Take 220 mg by mouth 2 (two) times daily as needed (pain.).     pantoprazole (PROTONIX) 40 MG tablet Take 1 tablet (40 mg total) by mouth daily. 30 tablet 11   rosuvastatin (CRESTOR) 5 MG tablet Take 5 mg by mouth at bedtime.     ferrous sulfate 325 (65 FE) MG EC tablet Take 1 tablet (325 mg total) by mouth every other day. 45 tablet 3   nitroGLYCERIN (NITROSTAT) 0.4 MG SL tablet Place 0.4 mg under the tongue every 5 (five) minutes x 3 doses as needed for chest pain. (Patient not taking: Reported on 03/13/2023)     No current facility-administered medications for this visit.     REVIEW OF SYSTEMS:   Constitutional: Denies fevers, chills or night sweats Eyes: Denies blurriness of vision Ears, nose, mouth, throat, and face: Denies mucositis or sore throat Respiratory: Denies cough, dyspnea or wheezes Cardiovascular: Denies palpitation, chest discomfort or lower extremity swelling Gastrointestinal:  Denies nausea, heartburn or change in bowel habits Skin: Denies abnormal skin rashes Lymphatics: Denies new lymphadenopathy or easy bruising Neurological:Denies numbness, tingling or new weaknesses Behavioral/Psych: Mood is stable, no new changes  All other systems were reviewed with the patient and  are negative.  PHYSICAL EXAMINATION:   Vitals:   03/13/23 1006  BP: 113/85  Pulse: 84  Resp: 16  Temp: 98.2 F (36.8 C)  SpO2: 98%    GENERAL:alert, no distress and comfortable SKIN: skin color, texture, turgor are normal, no rashes or significant lesions LYMPH:  no palpable lymphadenopathy in the  cervical, axillary or inguinal LUNGS: clear to auscultation and percussion with normal breathing effort HEART: regular rate & rhythm and no murmurs and no lower extremity edema ABDOMEN:abdomen soft, non-tender and normal bowel sounds Musculoskeletal:no cyanosis of digits and no clubbing  NEURO: alert & oriented x 3 with fluent speech  LABORATORY DATA:  I have reviewed the data as listed  Latest Reference Range & Units 04/18/15 10:57 07/11/15 15:05 08/29/15 11:10 10/02/15 08:44 12/12/15 15:59 03/17/16 12:27 07/16/16 08:49 11/18/16 09:23 12/25/16 08:51 05/21/17 09:34 11/18/17 11:02 08/12/19 11:58 06/05/21 07:32 07/21/22 12:48 09/29/22 13:51 02/11/23 12:43 03/13/23 11:04  WBC 4.0 - 10.5 K/uL 4.9 4.4 4.3 4.0 4.9 5.1 4.4 3.8 (L) 4.1 3.8 (L) 4.8 4.3 3.8 (L) 4.7 3.5 (L) 3.5 (L) 4.6  (L): Data is abnormally low  Latest Reference Range & Units 04/18/15 10:57 07/11/15 15:05 08/29/15 11:10 10/02/15 08:44 12/12/15 15:59 03/17/16 12:27 07/16/16 08:49 11/18/16 09:23 12/25/16 08:51 05/21/17 09:34 11/18/17 11:02 08/12/19 11:58 06/05/21 07:32 07/21/22 12:48 09/29/22 13:51 02/11/23 12:43 03/13/23 11:04  Platelets 150 - 400 K/uL 127 (L) 122 (L) 107 (L) 112 (L) 133 (L) 119 (L) 140 (L) 111 (L) 103 (L) 125 (L) 113 (L) 131 (L) 95 (L) PLATELET CLUMPS NOTED ON SMEAR, COUNT APPEARS ADEQUATE 88 (L) 88 (L) 131 (L)  (L): Data is abnormally low Lab Results  Component Value Date   WBC 4.6 03/13/2023   NEUTROABS 3.1 03/13/2023   HGB 14.3 03/13/2023   HCT 46.1 (H) 03/13/2023   MCV 82.5 03/13/2023   PLT 131 (L) 03/13/2023      Component Value Date/Time   NA 138 03/13/2023 1104   K 4.8 03/13/2023 1104   CL 103 03/13/2023 1104   CO2 29 03/13/2023 1104   GLUCOSE 117 (H) 03/13/2023 1104   BUN 13 03/13/2023 1104   CREATININE 0.87 03/13/2023 1104   CREATININE 0.73 07/15/2021 1302   CALCIUM 9.2 03/13/2023 1104   PROT 6.8 03/13/2023 1104   ALBUMIN 4.3 03/13/2023 1104   AST 31 03/13/2023 1104   ALT 19 03/13/2023 1104    ALKPHOS 49 03/13/2023 1104   BILITOT 0.6 03/13/2023 1104   GFRNONAA >60 03/13/2023 1104   GFRNONAA >89 04/18/2015 1057   GFRAA >60 11/18/2017 1102   GFRAA >89 04/18/2015 1057       Chemistry      Component Value Date/Time   NA 138 03/13/2023 1104   K 4.8 03/13/2023 1104   CL 103 03/13/2023 1104   CO2 29 03/13/2023 1104   BUN 13 03/13/2023 1104   CREATININE 0.87 03/13/2023 1104   CREATININE 0.73 07/15/2021 1302      Component Value Date/Time   CALCIUM 9.2 03/13/2023 1104   ALKPHOS 49 03/13/2023 1104   AST 31 03/13/2023 1104   ALT 19 03/13/2023 1104   BILITOT 0.6 03/13/2023 1104      Latest Reference Range & Units 12/12/15 16:00 07/15/21 13:02 03/13/23 11:04 03/13/23 11:05  Iron 28 - 170 ug/dL  48 58   UIBC ug/dL   782   TIBC 956 - 213 ug/dL  086 578   %SAT 16 - 45 % (calc)  13 (L)  Saturation Ratios 10.4 - 31.8 %   14   Ferritin 11 - 307 ng/mL 50 33 27   Folate >5.9 ng/mL    7.0  (L): Data is abnormally low   RADIOGRAPHIC STUDIES: US abdomen: 01/31/22:   EXAM: ABDOMEN ULTRASOUND COMPLETE   COMPARISON:  08/14/2021 MRI   FINDINGS: Gallbladder: Surgically removed   Common bile duct: Diameter: 6 mm   Liver: Diffusely increased in echogenicity consistent with fatty infiltration. No focal mass is noted. Portal vein is patent on color Doppler imaging with normal direction of blood flow towards the liver.   IVC: No abnormality visualized.   Pancreas: Visualized portion unremarkable.   Spleen: Enlarged in size.  Calculated volume is 1260 mL   Right Kidney: Length: 12.9 cm. No mass lesion or hydronephrosis is noted. 6 mm nonobstructing renal stone is seen. Mild cortical thinning is noted.   Left Kidney: Length: 13.1 cm. Mild cortical thinning is noted. No mass lesion or hydronephrosis is noted.   Abdominal aorta: No aneurysm visualized.   Other findings: None.   IMPRESSION: Fatty liver.   Splenomegaly.   Cortical thinning in the kidneys.    Nonobstructing right renal stone.     Electronically Signed   By: Alcide Clever M.D.   On: 02/02/2022 01:21  ASSESSMENT & PLAN:  Patient is a 62 year old female referred for leukopenia and thrombocytopenia   Leukopenia Patient has mild leukopenia likely secondary to liver cirrhosis no nutritional deficiencies.  Differential diagnosis also includes leukemia -Will order flow cytometry and nutritional studies.  If nutritional deficiencies are identified will replace as needed. -HIV and hepatitis panels previously were negative -repeat labs in 6 weeks  Thrombocytopenia (HCC) Patient always had borderline low platelet counts which decreased further recently.  Likely secondary to liver cirrhosis and splenomegaly. -Will repeat labs today along with nutritional studies -Continue to monitor for now and if at any point they go below 50 we will consider a bone marrow biopsy  Liver cirrhosis (HCC) History of fatty liver disease. Unclear if cirrhosis is present as imaging results have been inconsistent. -Continue monitoring with Dr. Kendell Bane.  Splenomegaly Enlarged spleen identified on previous imaging. This could be contributing to the low platelet count.  Likely secondary to liver cirrhosis. -Continue monitoring.  Iron deficiency Patient has iron deficiency without anemia.  Ferritin of 27 and TSAT of 14. -Recommended patient to take oral iron every other day -If does not respond to treatment will do IV iron   Orders Placed This Encounter  Procedures   CBC with Differential/Platelet    Standing Status:   Future    Number of Occurrences:   1    Standing Expiration Date:   03/12/2024   Comprehensive metabolic panel    Standing Status:   Future    Number of Occurrences:   1    Standing Expiration Date:   03/12/2024   Ferritin    Standing Status:   Future    Number of Occurrences:   1    Standing Expiration Date:   03/12/2024   Folate    Standing Status:   Future    Number of  Occurrences:   1    Standing Expiration Date:   03/12/2024   Vitamin B12    Standing Status:   Future    Number of Occurrences:   1    Standing Expiration Date:   03/12/2024   Reticulocytes    Standing Status:   Future    Number of Occurrences:  1    Standing Expiration Date:   03/12/2024   Lactate dehydrogenase    Standing Status:   Future    Number of Occurrences:   1    Standing Expiration Date:   03/12/2024   Iron and TIBC    Standing Status:   Future    Number of Occurrences:   1    Standing Expiration Date:   03/12/2024    Order Specific Question:   Release to patient    Answer:   Immediate [1]   Flow Cytometry, Peripheral Blood (Oncology)    Standing Status:   Future    Number of Occurrences:   1    Standing Expiration Date:   03/12/2024   Uric acid    Standing Status:   Future    Number of Occurrences:   1    Standing Expiration Date:   03/12/2024    The total time spent in the appointment was 45 minutes encounter with patients including review of chart and various tests results, discussions about plan of care and coordination of care plan   All questions were answered. The patient knows to call the clinic with any problems, questions or concerns. No barriers to learning was detected.   Cindie Crumbly, MD 11/22/20241:42 PM

## 2023-03-13 NOTE — Progress Notes (Unsigned)
Physician stated patients iron levels were low and called prescription in for Iron and to take every other day.  Patient called and aware of results and will take medication as prescribed.

## 2023-03-13 NOTE — Assessment & Plan Note (Signed)
Enlarged spleen identified on previous imaging. This could be contributing to the low platelet count.  Likely secondary to liver cirrhosis. -Continue monitoring.

## 2023-03-13 NOTE — Assessment & Plan Note (Signed)
Patient has iron deficiency without anemia.  Ferritin of 27 and TSAT of 14. -Recommended patient to take oral iron every other day -If does not respond to treatment will do IV iron

## 2023-03-17 LAB — SURGICAL PATHOLOGY

## 2023-03-23 ENCOUNTER — Encounter: Payer: Self-pay | Admitting: Nurse Practitioner

## 2023-03-24 ENCOUNTER — Other Ambulatory Visit: Payer: Self-pay | Admitting: Oncology

## 2023-03-24 DIAGNOSIS — D709 Neutropenia, unspecified: Secondary | ICD-10-CM

## 2023-03-24 LAB — FLOW CYTOMETRY

## 2023-03-25 ENCOUNTER — Encounter (HOSPITAL_COMMUNITY): Payer: Self-pay

## 2023-03-25 ENCOUNTER — Ambulatory Visit (HOSPITAL_COMMUNITY): Payer: Medicare Other

## 2023-03-27 ENCOUNTER — Other Ambulatory Visit: Payer: Self-pay | Admitting: Radiology

## 2023-03-27 DIAGNOSIS — D709 Neutropenia, unspecified: Secondary | ICD-10-CM

## 2023-03-30 ENCOUNTER — Ambulatory Visit (HOSPITAL_COMMUNITY)
Admission: RE | Admit: 2023-03-30 | Discharge: 2023-03-30 | Disposition: A | Discharge status: Home / Self Care | Payer: Medicare Other | Source: Ambulatory Visit | Attending: Oncology | Admitting: Oncology

## 2023-03-30 ENCOUNTER — Ambulatory Visit (HOSPITAL_COMMUNITY): Payer: Medicare Other

## 2023-03-30 DIAGNOSIS — I700 Atherosclerosis of aorta: Secondary | ICD-10-CM | POA: Diagnosis not present

## 2023-03-30 DIAGNOSIS — K7460 Unspecified cirrhosis of liver: Secondary | ICD-10-CM | POA: Diagnosis not present

## 2023-03-30 DIAGNOSIS — J069 Acute upper respiratory infection, unspecified: Secondary | ICD-10-CM | POA: Diagnosis not present

## 2023-03-30 DIAGNOSIS — J449 Chronic obstructive pulmonary disease, unspecified: Secondary | ICD-10-CM | POA: Diagnosis not present

## 2023-04-08 ENCOUNTER — Other Ambulatory Visit: Payer: Self-pay | Admitting: Radiology

## 2023-04-09 DIAGNOSIS — E538 Deficiency of other specified B group vitamins: Secondary | ICD-10-CM | POA: Diagnosis not present
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
## 2023-04-10 ENCOUNTER — Ambulatory Visit (HOSPITAL_COMMUNITY)
Admission: RE | Admit: 2023-04-10 | Discharge: 2023-04-10 | Disposition: A | Discharge status: Home / Self Care | Payer: Medicare Other | Source: Ambulatory Visit | Attending: Oncology | Admitting: Oncology

## 2023-04-10 ENCOUNTER — Other Ambulatory Visit: Payer: Self-pay

## 2023-04-10 ENCOUNTER — Encounter (HOSPITAL_COMMUNITY): Payer: Self-pay

## 2023-04-10 DIAGNOSIS — M1990 Unspecified osteoarthritis, unspecified site: Secondary | ICD-10-CM | POA: Insufficient documentation

## 2023-04-10 DIAGNOSIS — R161 Splenomegaly, not elsewhere classified: Secondary | ICD-10-CM | POA: Diagnosis not present

## 2023-04-10 DIAGNOSIS — F419 Anxiety disorder, unspecified: Secondary | ICD-10-CM | POA: Diagnosis not present

## 2023-04-10 DIAGNOSIS — D696 Thrombocytopenia, unspecified: Secondary | ICD-10-CM | POA: Insufficient documentation

## 2023-04-10 DIAGNOSIS — E611 Iron deficiency: Secondary | ICD-10-CM | POA: Diagnosis not present

## 2023-04-10 DIAGNOSIS — E538 Deficiency of other specified B group vitamins: Secondary | ICD-10-CM | POA: Insufficient documentation

## 2023-04-10 DIAGNOSIS — Z860100 Personal history of colon polyps, unspecified: Secondary | ICD-10-CM | POA: Diagnosis not present

## 2023-04-10 DIAGNOSIS — G4730 Sleep apnea, unspecified: Secondary | ICD-10-CM | POA: Diagnosis not present

## 2023-04-10 DIAGNOSIS — J449 Chronic obstructive pulmonary disease, unspecified: Secondary | ICD-10-CM | POA: Diagnosis not present

## 2023-04-10 DIAGNOSIS — Z79899 Other long term (current) drug therapy: Secondary | ICD-10-CM | POA: Diagnosis not present

## 2023-04-10 DIAGNOSIS — K760 Fatty (change of) liver, not elsewhere classified: Secondary | ICD-10-CM | POA: Diagnosis not present

## 2023-04-10 DIAGNOSIS — F17210 Nicotine dependence, cigarettes, uncomplicated: Secondary | ICD-10-CM | POA: Insufficient documentation

## 2023-04-10 DIAGNOSIS — D509 Iron deficiency anemia, unspecified: Secondary | ICD-10-CM | POA: Diagnosis not present

## 2023-04-10 DIAGNOSIS — I2510 Atherosclerotic heart disease of native coronary artery without angina pectoris: Secondary | ICD-10-CM | POA: Diagnosis not present

## 2023-04-10 DIAGNOSIS — C9000 Multiple myeloma not having achieved remission: Secondary | ICD-10-CM | POA: Diagnosis not present

## 2023-04-10 DIAGNOSIS — D709 Neutropenia, unspecified: Secondary | ICD-10-CM | POA: Diagnosis not present

## 2023-04-10 DIAGNOSIS — D72819 Decreased white blood cell count, unspecified: Secondary | ICD-10-CM | POA: Diagnosis not present

## 2023-04-10 DIAGNOSIS — Z87442 Personal history of urinary calculi: Secondary | ICD-10-CM | POA: Insufficient documentation

## 2023-04-10 DIAGNOSIS — K219 Gastro-esophageal reflux disease without esophagitis: Secondary | ICD-10-CM | POA: Insufficient documentation

## 2023-04-10 DIAGNOSIS — F32A Depression, unspecified: Secondary | ICD-10-CM | POA: Diagnosis not present

## 2023-04-10 DIAGNOSIS — R768 Other specified abnormal immunological findings in serum: Secondary | ICD-10-CM | POA: Insufficient documentation

## 2023-04-10 LAB — CBC WITH DIFFERENTIAL/PLATELET
Abs Immature Granulocytes: 0.02 10*3/uL (ref 0.00–0.07)
Basophils Absolute: 0 10*3/uL (ref 0.0–0.1)
Basophils Relative: 1 %
Eosinophils Absolute: 0 10*3/uL (ref 0.0–0.5)
Eosinophils Relative: 1 %
HCT: 39.3 % (ref 36.0–46.0)
Hemoglobin: 12.7 g/dL (ref 12.0–15.0)
Immature Granulocytes: 1 %
Lymphocytes Relative: 24 %
Lymphs Abs: 0.8 10*3/uL (ref 0.7–4.0)
MCH: 26.2 pg (ref 26.0–34.0)
MCHC: 32.3 g/dL (ref 30.0–36.0)
MCV: 81 fL (ref 80.0–100.0)
Monocytes Absolute: 0.2 10*3/uL (ref 0.1–1.0)
Monocytes Relative: 6 %
Neutro Abs: 2.4 10*3/uL (ref 1.7–7.7)
Neutrophils Relative %: 67 %
Platelets: 88 10*3/uL — ABNORMAL LOW (ref 150–400)
RBC: 4.85 MIL/uL (ref 3.87–5.11)
RDW: 14.1 % (ref 11.5–15.5)
WBC: 3.5 10*3/uL — ABNORMAL LOW (ref 4.0–10.5)
nRBC: 0 % (ref 0.0–0.2)

## 2023-04-10 MED ORDER — FENTANYL CITRATE (PF) 100 MCG/2ML IJ SOLN
INTRAMUSCULAR | Status: AC | PRN
  Administered 2023-04-10 (×2): 50 ug via INTRAVENOUS

## 2023-04-10 MED ORDER — MIDAZOLAM HCL 2 MG/2ML IJ SOLN
INTRAMUSCULAR | Status: AC | PRN
  Administered 2023-04-10 (×4): 1 mg via INTRAVENOUS

## 2023-04-10 MED ORDER — MIDAZOLAM HCL 2 MG/2ML IJ SOLN
INTRAMUSCULAR | Status: AC
Start: 2023-04-10 — End: ?
  Filled 2023-04-10: qty 4

## 2023-04-10 MED ORDER — FENTANYL CITRATE (PF) 100 MCG/2ML IJ SOLN
INTRAMUSCULAR | Status: AC
  Filled 2023-04-10: qty 2

## 2023-04-10 MED ORDER — SODIUM CHLORIDE 0.9 % IV SOLN: INTRAVENOUS | Status: DC

## 2023-04-10 NOTE — Discharge Instructions (Addendum)
Please call Interventional Radiology clinic [phone] with any questions or concerns. ? ?You may remove your dressing and shower tomorrow. ? ? ?Bone Marrow Aspiration and Bone Marrow Biopsy, Adult, Care After ?This sheet gives you information about how to care for yourself after your procedure. Your health care provider may also give you more specific instructions. If you have problems or questions, contact your health care provider. ?What can I expect after the procedure? ?After the procedure, it is common to have: ?Mild pain and tenderness. ?Swelling. ?Bruising. ?Follow these instructions at home: ?Puncture site care ?Follow instructions from your health care provider about how to take care of the puncture site. Make sure you: ?Wash your hands with soap and water before and after you change your bandage (dressing). If soap and water are not available, use hand sanitizer. ?Change your dressing as told by your health care provider. ?Check your puncture site every day for signs of infection. Check for: ?More redness, swelling, or pain. ?Fluid or blood. ?Warmth. ?Pus or a bad smell.   ?Activity ?Return to your normal activities as told by your health care provider. Ask your health care provider what activities are safe for you. ?Do not lift anything that is heavier than 10 lb (4.5 kg), or the limit that you are told, until your health care provider says that it is safe. ?Do not drive for 24 hours if you were given a sedative during your procedure. ?General instructions ?Take over-the-counter and prescription medicines only as told by your health care provider. ?Do not take baths, swim, or use a hot tub until your health care provider approves. Ask your health care provider if you may take showers. You may only be allowed to take sponge baths. ?If directed, put ice on the affected area. To do this: ?Put ice in a plastic bag. ?Place a towel between your skin and the bag. ?Leave the ice on for 20 minutes, 2-3 times a  day. ?Keep all follow-up visits as told by your health care provider. This is important.   ?Contact a health care provider if: ?Your pain is not controlled with medicine. ?You have a fever. ?You have more redness, swelling, or pain around the puncture site. ?You have fluid or blood coming from the puncture site. ?Your puncture site feels warm to the touch. ?You have pus or a bad smell coming from the puncture site. ?Summary ?After the procedure, it is common to have mild pain, tenderness, swelling, and bruising. ?Follow instructions from your health care provider about how to take care of the puncture site and what activities are safe for you. ?Take over-the-counter and prescription medicines only as told by your health care provider. ?Contact a health care provider if you have any signs of infection, such as fluid or blood coming from the puncture site. ?This information is not intended to replace advice given to you by your health care provider. Make sure you discuss any questions you have with your health care provider. ?Document Revised: 08/24/2018 Document Reviewed: 08/24/2018 ?Elsevier Patient Education ? 2021 [employer] ? ? ?Moderate Conscious Sedation, Adult, Care After ?This sheet gives you information about how to care for yourself after your procedure. Your health care provider may also give you more specific instructions. If you have problems or questions, contact your health care provider. ?What can I expect after the procedure? ?After the procedure, it is common to have: ?Sleepiness for several hours. ?Impaired judgment for several hours. ?Difficulty with balance. ?Vomiting if you eat too   soon. ?Follow these instructions at home: ?For the time period you were told by your health care provider: ?Rest. ?Do not participate in activities where you could fall or become injured. ?Do not drive or use machinery. ?Do not drink alcohol. ?Do not take sleeping pills or medicines that cause drowsiness. ?Do not  make important decisions or sign legal documents. ?Do not take care of children on your own.  ?  ?  ?Eating and drinking ?Follow the diet recommended by your health care provider. ?Drink enough fluid to keep your urine pale yellow. ?If you vomit: ?Drink water, juice, or soup when you can drink without vomiting. ?Make sure you have little or no nausea before eating solid foods.   ?General instructions ?Take over-the-counter and prescription medicines only as told by your health care provider. ?Have a responsible adult stay with you for the time you are told. It is important to have someone help care for you until you are awake and alert. ?Do not smoke. ?Keep all follow-up visits as told by your health care provider. This is important. ?Contact a health care provider if: ?You are still sleepy or having trouble with balance after 24 hours. ?You feel light-headed. ?You keep feeling nauseous or you keep vomiting. ?You develop a rash. ?You have a fever. ?You have redness or swelling around the IV site. ?Get help right away if: ?You have trouble breathing. ?You have new-onset confusion at home. ?Summary ?After the procedure, it is common to feel sleepy, have impaired judgment, or feel nauseous if you eat too soon. ?Rest after you get home. Know the things you should not do after the procedure. ?Follow the diet recommended by your health care provider and drink enough fluid to keep your urine pale yellow. ?Get help right away if you have trouble breathing or new-onset confusion at home. ?This information is not intended to replace advice given to you by your health care provider. Make sure you discuss any questions you have with your health care provider. ?Document Revised: 08/05/2019 Document Reviewed: 03/03/2019 ?Elsevier Patient Education ? 2021 [employer]  ?

## 2023-04-10 NOTE — Procedures (Signed)
Pre-procedure Diagnosis: Leukopenia, thrombocytopenia, splenomegaly, iron deficiency, and abnormal clonal B-cell population identified on flow cytometry Post-procedure Diagnosis: Same  Technically successful CT guided bone marrow aspiration and biopsy of left iliac crest.   Complications: None Immediate EBL: Trace  Signed: Simonne Come Pager: [phone] 04/10/2023, 11:21 AM

## 2023-04-13 ENCOUNTER — Ambulatory Visit (HOSPITAL_COMMUNITY)
Admission: RE | Admit: 2023-04-13 | Discharge: 2023-04-13 | Disposition: A | Discharge status: Home / Self Care | Payer: Medicare Other | Source: Ambulatory Visit | Attending: Oncology | Admitting: Oncology

## 2023-04-13 DIAGNOSIS — I700 Atherosclerosis of aorta: Secondary | ICD-10-CM | POA: Diagnosis not present

## 2023-04-13 DIAGNOSIS — K760 Fatty (change of) liver, not elsewhere classified: Secondary | ICD-10-CM | POA: Diagnosis not present

## 2023-04-13 DIAGNOSIS — D709 Neutropenia, unspecified: Secondary | ICD-10-CM | POA: Diagnosis not present

## 2023-04-13 DIAGNOSIS — R590 Localized enlarged lymph nodes: Secondary | ICD-10-CM | POA: Diagnosis not present

## 2023-04-13 DIAGNOSIS — R162 Hepatomegaly with splenomegaly, not elsewhere classified: Secondary | ICD-10-CM | POA: Diagnosis not present

## 2023-04-13 DIAGNOSIS — R600 Localized edema: Secondary | ICD-10-CM | POA: Diagnosis not present

## 2023-04-13 MED ORDER — IOHEXOL 300 MG/ML  SOLN
100.0000 mL | Freq: Once | INTRAMUSCULAR | Status: AC | PRN
  Administered 2023-04-13: 100 mL via INTRAVENOUS

## 2023-04-16 LAB — SURGICAL PATHOLOGY

## 2023-04-21 ENCOUNTER — Encounter (HOSPITAL_COMMUNITY): Payer: Self-pay | Admitting: Oncology

## 2023-04-24 ENCOUNTER — Other Ambulatory Visit: Payer: Self-pay

## 2023-04-24 DIAGNOSIS — D72819 Decreased white blood cell count, unspecified: Secondary | ICD-10-CM

## 2023-04-24 DIAGNOSIS — E611 Iron deficiency: Secondary | ICD-10-CM

## 2023-04-24 DIAGNOSIS — H5211 Myopia, right eye: Secondary | ICD-10-CM | POA: Diagnosis not present

## 2023-04-24 DIAGNOSIS — D709 Neutropenia, unspecified: Secondary | ICD-10-CM

## 2023-04-24 DIAGNOSIS — D696 Thrombocytopenia, unspecified: Secondary | ICD-10-CM

## 2023-04-27 ENCOUNTER — Inpatient Hospital Stay: Payer: Medicare Other | Attending: Oncology

## 2023-04-27 DIAGNOSIS — D72819 Decreased white blood cell count, unspecified: Secondary | ICD-10-CM | POA: Diagnosis not present

## 2023-04-27 DIAGNOSIS — D696 Thrombocytopenia, unspecified: Secondary | ICD-10-CM | POA: Insufficient documentation

## 2023-04-27 DIAGNOSIS — D709 Neutropenia, unspecified: Secondary | ICD-10-CM

## 2023-04-27 DIAGNOSIS — E611 Iron deficiency: Secondary | ICD-10-CM | POA: Diagnosis not present

## 2023-04-27 DIAGNOSIS — R161 Splenomegaly, not elsewhere classified: Secondary | ICD-10-CM | POA: Insufficient documentation

## 2023-04-27 DIAGNOSIS — K7460 Unspecified cirrhosis of liver: Secondary | ICD-10-CM | POA: Diagnosis not present

## 2023-04-27 LAB — COMPREHENSIVE METABOLIC PANEL
ALT: 18 U/L (ref 0–44)
AST: 32 U/L (ref 15–41)
Albumin: 4.1 g/dL (ref 3.5–5.0)
Alkaline Phosphatase: 47 U/L (ref 38–126)
Anion gap: 4 — ABNORMAL LOW (ref 5–15)
BUN: 12 mg/dL (ref 8–23)
CO2: 27 mmol/L (ref 22–32)
Calcium: 8.9 mg/dL (ref 8.9–10.3)
Chloride: 106 mmol/L (ref 98–111)
Creatinine, Ser: 0.71 mg/dL (ref 0.44–1.00)
GFR, Estimated: >60 mL/min (ref >60)
Glucose, Bld: 160 mg/dL — ABNORMAL HIGH (ref 70–99)
Potassium: 3.9 mmol/L (ref 3.5–5.1)
Sodium: 137 mmol/L (ref 135–145)
Total Bilirubin: 0.6 mg/dL (ref 0.0–1.2)
Total Protein: 6.7 g/dL (ref 6.5–8.1)

## 2023-04-27 LAB — CBC WITH DIFFERENTIAL/PLATELET
Abs Immature Granulocytes: 0.01 10*3/uL (ref 0.00–0.07)
Basophils Absolute: 0.1 10*3/uL (ref 0.0–0.1)
Basophils Relative: 1 %
Eosinophils Absolute: 0.1 10*3/uL (ref 0.0–0.5)
Eosinophils Relative: 2 %
HCT: 40.4 % (ref 36.0–46.0)
Hemoglobin: 12.8 g/dL (ref 12.0–15.0)
Immature Granulocytes: 0 %
Lymphocytes Relative: 22 %
Lymphs Abs: 0.8 10*3/uL (ref 0.7–4.0)
MCH: 26.2 pg (ref 26.0–34.0)
MCHC: 31.7 g/dL (ref 30.0–36.0)
MCV: 82.6 fL (ref 80.0–100.0)
Monocytes Absolute: 0.3 10*3/uL (ref 0.1–1.0)
Monocytes Relative: 7 %
Neutro Abs: 2.4 10*3/uL (ref 1.7–7.7)
Neutrophils Relative %: 68 %
Platelets: 93 10*3/uL — ABNORMAL LOW (ref 150–400)
RBC: 4.89 MIL/uL (ref 3.87–5.11)
RDW: 14.7 % (ref 11.5–15.5)
WBC: 3.6 10*3/uL — ABNORMAL LOW (ref 4.0–10.5)
nRBC: 0 % (ref 0.0–0.2)

## 2023-04-27 LAB — IRON AND TIBC
Iron: 114 ug/dL (ref 28–170)
Saturation Ratios: 28 % (ref 10.4–31.8)
TIBC: 415 ug/dL (ref 250–450)
UIBC: 301 ug/dL

## 2023-04-27 LAB — FERRITIN: Ferritin: 28 ng/mL (ref 11–307)

## 2023-05-04 ENCOUNTER — Inpatient Hospital Stay: Payer: Medicare Other | Admitting: Oncology

## 2023-05-12 DIAGNOSIS — E538 Deficiency of other specified B group vitamins: Secondary | ICD-10-CM | POA: Diagnosis not present

## 2023-05-13 ENCOUNTER — Inpatient Hospital Stay: Payer: Medicare Other | Admitting: Oncology

## 2023-05-13 ENCOUNTER — Encounter: Payer: Self-pay | Admitting: Oncology

## 2023-05-13 VITALS — BP 126/72 | HR 90 | Temp 97.0°F | Resp 19 | Ht 66.0 in | Wt 172.0 lb

## 2023-05-13 DIAGNOSIS — K7469 Other cirrhosis of liver: Secondary | ICD-10-CM | POA: Diagnosis not present

## 2023-05-13 DIAGNOSIS — R161 Splenomegaly, not elsewhere classified: Secondary | ICD-10-CM | POA: Diagnosis not present

## 2023-05-13 DIAGNOSIS — D696 Thrombocytopenia, unspecified: Secondary | ICD-10-CM

## 2023-05-13 DIAGNOSIS — D72810 Lymphocytopenia: Secondary | ICD-10-CM

## 2023-05-13 DIAGNOSIS — D72819 Decreased white blood cell count, unspecified: Secondary | ICD-10-CM | POA: Diagnosis not present

## 2023-05-13 DIAGNOSIS — E611 Iron deficiency: Secondary | ICD-10-CM

## 2023-05-13 DIAGNOSIS — K7460 Unspecified cirrhosis of liver: Secondary | ICD-10-CM | POA: Diagnosis not present

## 2023-05-13 NOTE — Assessment & Plan Note (Signed)
History of fatty liver disease. Unclear if cirrhosis is present as imaging results have been inconsistent. -Continue monitoring with Dr. Jena Gauss.

## 2023-05-13 NOTE — Assessment & Plan Note (Signed)
Patient has iron deficiency without anemia.  Improved TSAT. -Continue to take oral iron every other day

## 2023-05-13 NOTE — Assessment & Plan Note (Signed)
 Enlarged spleen identified on previous imaging. This could be contributing to the low platelet count.  Likely secondary to liver cirrhosis. -Continue monitoring.

## 2023-05-13 NOTE — Progress Notes (Signed)
[facility] at [facility] HEMATOLOGY FOLLOW-UP VISIT  Destiny Villanueva C  REASON FOR FOLLOW-UP: Leukopenia and thrombocytopenia  ASSESSMENT & PLAN:  Patient is a 63 year old female with past medical history of liver cirrhosis following for leukopenia and thrombocytopenia   Leukopenia Patient has mild leukopenia likely secondary to liver cirrhosis no nutritional deficiencies.  Initial peripheral blood flow cytometry concerning for monoclonal B-cell population along with kappa restricted light chains.  Bone marrow biopsy was done which did not show any clonal B-cell population. -Repeat labs in 3 months -Will obtain SPEP with next labs -Continue to follow-up with gastroenterology  Thrombocytopenia (HCC) Likely secondary to splenomegaly from liver disease. No signs of bleeding. -Monitor for signs of bleeding. -Continue follow-up with gastroenterologist for liver disease management. -Repeat labs in 3 months  Iron deficiency Patient has iron deficiency without anemia.  Improved TSAT. -Continue to take oral iron every other day  Splenomegaly Enlarged spleen identified on previous imaging. This could be contributing to the low platelet count.  Likely secondary to liver cirrhosis. -Continue monitoring.  Liver cirrhosis (HCC) History of fatty liver disease. Unclear if cirrhosis is present as imaging results have been inconsistent. -Continue monitoring with Dr. Jena Gauss.   Orders Placed This Encounter  Procedures   CBC with Differential/Platelet    Standing Status:   Future    Expected Date:   08/04/2023    Expiration Date:   05/12/2024   Comprehensive metabolic panel    Standing Status:   Future    Expected Date:   08/04/2023    Expiration Date:   05/12/2024   Beta 2 microglobulin, serum    Standing Status:   Future    Expected Date:   08/04/2023    Expiration Date:   05/12/2024   IgG, IgA, IgM    Standing Status:   Future    Expected Date:   08/04/2023     Expiration Date:   05/12/2024   Multiple Myeloma Panel (SPEP&IFE w/QIG)    Standing Status:   Future    Expected Date:   08/04/2023    Expiration Date:   05/12/2024    The total time spent in the appointment was 20 minutes encounter with patients including review of chart and various tests results, discussions about plan of care and coordination of care plan   All questions were answered. The patient knows to call the clinic with any problems, questions or concerns. No barriers to learning was detected.  Cindie Crumbly, MD 1/22/202512:48 PM    INTERVAL HISTORY: Patient is a 63 year old female following for leukopenia and thrombocytopenia.  Patient has a known history of liver cirrhosis.  She recently had a bone marrow biopsy done due to concern of clonal B cells on peripheral flow cytometry.  However, the bone marrow biopsy did not show any clonal B cells. Patient overall has been doing good.She reports some infrequent night sweats but no fever or chills.  I have reviewed the past medical history, past surgical history, social history and family history with the patient   ALLERGIES:  is allergic to aspirin, ciprofloxacin, hydrocodone, penicillins, fluocinolone, fosamax [alendronate], and sulfa antibiotics.  MEDICATIONS:  Current Outpatient Medications  Medication Sig Dispense Refill   albuterol (PROVENTIL HFA;VENTOLIN HFA) 108 (90 Base) MCG/ACT inhaler Inhale 2 puffs into the lungs every 6 (six) hours as needed for wheezing or shortness of breath. 3 Inhaler 0   calcium carbonate (OSCAL) 1500 (600 Ca) MG TABS tablet Take 600 mg of elemental calcium by  mouth daily with breakfast.     Cholecalciferol (VITAMIN D3) 50 MCG (2000 UT) capsule Take 2,000 Units by mouth in the morning.     citalopram (CELEXA) 40 MG tablet Take 40 mg by mouth at bedtime.     cyanocobalamin (VITAMIN B12) 1000 MCG/ML injection Inject into the muscle.     dicyclomine (BENTYL) 10 MG capsule Take 1 capsule (10 mg total)  by mouth 2 (two) times daily as needed for spasms (loose stools). 60 capsule 0   ferrous sulfate 325 (65 FE) MG EC tablet Take 1 tablet (325 mg total) by mouth every other day. 45 tablet 3   metoprolol succinate (TOPROL-XL) 25 MG 24 hr tablet Take 12.5 mg by mouth in the morning.     naproxen sodium (ALEVE) 220 MG tablet Take 220 mg by mouth 2 (two) times daily as needed (pain.).     nitroGLYCERIN (NITROSTAT) 0.4 MG SL tablet Place 0.4 mg under the tongue every 5 (five) minutes x 3 doses as needed for chest pain.     pantoprazole (PROTONIX) 40 MG tablet Take 1 tablet (40 mg total) by mouth daily. 30 tablet 11   rosuvastatin (CRESTOR) 5 MG tablet Take 5 mg by mouth at bedtime.     No current facility-administered medications for this visit.     REVIEW OF SYSTEMS:   Constitutional: Denies fevers, chills  Eyes: Denies blurriness of vision Ears, nose, mouth, throat, and face: Denies mucositis or sore throat Respiratory: Denies cough, dyspnea or wheezes Cardiovascular: Denies palpitation, chest discomfort or lower extremity swelling Gastrointestinal:  Denies nausea, heartburn or change in bowel habits Skin: Denies abnormal skin rashes Lymphatics: Denies new lymphadenopathy or easy bruising Neurological:Denies numbness, tingling or new weaknesses Behavioral/Psych: Mood is stable, no new changes  All other systems were reviewed with the patient and are negative.  PHYSICAL EXAMINATION:   Vitals:   05/13/23 0818  BP: 126/72  Pulse: 90  Resp: 19  Temp: (!) 97 F (36.1 C)  SpO2: 98%    GENERAL:alert, no distress and comfortable LYMPH:  no palpable lymphadenopathy in the cervical, axillary or inguinal LUNGS: clear to auscultation and percussion with normal breathing effort HEART: regular rate & rhythm and no murmurs and no lower extremity edema ABDOMEN:abdomen soft, non-tender and normal bowel sounds.  Restricted due to abdominal fat.  Could not palpate spleen. Musculoskeletal:no  cyanosis of digits and no clubbing  NEURO: alert & oriented x 3 with fluent speech  LABORATORY DATA:  I have reviewed the data as listed  Lab Results  Component Value Date   WBC 3.6 (L) 04/27/2023   NEUTROABS 2.4 04/27/2023   HGB 12.8 04/27/2023   HCT 40.4 04/27/2023   MCV 82.6 04/27/2023   PLT 93 (L) 04/27/2023      Component Value Date/Time   NA 137 04/27/2023 1356   K 3.9 04/27/2023 1356   CL 106 04/27/2023 1356   CO2 27 04/27/2023 1356   GLUCOSE 160 (H) 04/27/2023 1356   BUN 12 04/27/2023 1356   CREATININE 0.71 04/27/2023 1356   CREATININE 0.73 07/15/2021 1302   CALCIUM 8.9 04/27/2023 1356   PROT 6.7 04/27/2023 1356   ALBUMIN 4.1 04/27/2023 1356   AST 32 04/27/2023 1356   ALT 18 04/27/2023 1356   ALKPHOS 47 04/27/2023 1356   BILITOT 0.6 04/27/2023 1356   GFRNONAA >60 04/27/2023 1356   GFRNONAA >89 04/18/2015 1057   GFRAA >60 11/18/2017 1102   GFRAA >89 04/18/2015 1057  Chemistry      Component Value Date/Time   NA 137 04/27/2023 1356   K 3.9 04/27/2023 1356   CL 106 04/27/2023 1356   CO2 27 04/27/2023 1356   BUN 12 04/27/2023 1356   CREATININE 0.71 04/27/2023 1356   CREATININE 0.73 07/15/2021 1302      Component Value Date/Time   CALCIUM 8.9 04/27/2023 1356   ALKPHOS 47 04/27/2023 1356   AST 32 04/27/2023 1356   ALT 18 04/27/2023 1356   BILITOT 0.6 04/27/2023 1356     Pathology: Bone marrow biopsy:  DIAGNOSIS:  Bone marrow, aspirate, flow cytometry: -  1% CD34 positive blasts, not increased. -  No definitive immunophenotypic evidence of a lymphoproliferative disorder.  Note: It is noted that there is a previous history of a kappa clonal population the peripheral blood.  The bone marrow flow cytometry does show a mild kappa predominance; however, this is on a low level of overall B cells and the pattern is somewhat artifactual in nature.  A definitive clonal population of B cells is not detected

## 2023-05-13 NOTE — Assessment & Plan Note (Signed)
Likely secondary to splenomegaly from liver disease. No signs of bleeding. -Monitor for signs of bleeding. -Continue follow-up with gastroenterologist for liver disease management. -Repeat labs in 3 months

## 2023-05-13 NOTE — Assessment & Plan Note (Signed)
Patient has mild leukopenia likely secondary to liver cirrhosis no nutritional deficiencies.  Initial peripheral blood flow cytometry concerning for monoclonal B-cell population along with kappa restricted light chains.  Bone marrow biopsy was done which did not show any clonal B-cell population. -Repeat labs in 3 months -Will obtain SPEP with next labs -Continue to follow-up with gastroenterology

## 2023-05-13 NOTE — Patient Instructions (Signed)
VISIT SUMMARY:  You came in today for a follow-up after your bone marrow biopsy. The biopsy results showed that the concerning cells seen on your previous blood test were not a problem. We discussed your ongoing symptoms, including night sweats, fatigue, and recent weight loss.  YOUR PLAN:  -THROMBOCYTOPENIA: Thrombocytopenia means you have a lower than normal number of platelets in your blood, which can be due to your enlarged spleen from liver disease. We will continue to monitor for any signs of bleeding, such as gum bleeding or prolonged bleeding from cuts. Please continue your follow-up with your gastroenterologist for managing your liver disease.   -ABNORMAL PROTEIN: An abnormal protein was detected in your previous tests, but the bone marrow biopsy showed it is not a concern. We will do another test called protein electrophoresis in 3 months to check on this further.  INSTRUCTIONS:  Please follow up in 3 months to review your lab results and reassess your symptoms. If you experience any new symptoms or have any concerns before then, contact our office or use the app to communicate with [place].

## 2023-06-04 ENCOUNTER — Encounter: Payer: Self-pay | Admitting: Internal Medicine

## 2023-06-16 DIAGNOSIS — J069 Acute upper respiratory infection, unspecified: Secondary | ICD-10-CM | POA: Diagnosis not present

## 2023-06-16 DIAGNOSIS — Z299 Encounter for prophylactic measures, unspecified: Secondary | ICD-10-CM | POA: Diagnosis not present

## 2023-06-16 DIAGNOSIS — E538 Deficiency of other specified B group vitamins: Secondary | ICD-10-CM | POA: Diagnosis not present

## 2023-06-16 DIAGNOSIS — F17210 Nicotine dependence, cigarettes, uncomplicated: Secondary | ICD-10-CM | POA: Diagnosis not present

## 2023-06-16 DIAGNOSIS — J449 Chronic obstructive pulmonary disease, unspecified: Secondary | ICD-10-CM | POA: Diagnosis not present

## 2023-06-23 ENCOUNTER — Ambulatory Visit: Payer: Medicare Other | Admitting: Gastroenterology

## 2023-07-14 DIAGNOSIS — E538 Deficiency of other specified B group vitamins: Secondary | ICD-10-CM | POA: Diagnosis not present

## 2023-07-23 DIAGNOSIS — J449 Chronic obstructive pulmonary disease, unspecified: Secondary | ICD-10-CM | POA: Diagnosis not present

## 2023-07-23 DIAGNOSIS — F17210 Nicotine dependence, cigarettes, uncomplicated: Secondary | ICD-10-CM | POA: Diagnosis not present

## 2023-07-23 DIAGNOSIS — I700 Atherosclerosis of aorta: Secondary | ICD-10-CM | POA: Diagnosis not present

## 2023-07-23 DIAGNOSIS — Z299 Encounter for prophylactic measures, unspecified: Secondary | ICD-10-CM | POA: Diagnosis not present

## 2023-08-11 ENCOUNTER — Inpatient Hospital Stay: Payer: Medicare Other

## 2023-08-12 ENCOUNTER — Inpatient Hospital Stay: Attending: Oncology

## 2023-08-12 DIAGNOSIS — R161 Splenomegaly, not elsewhere classified: Secondary | ICD-10-CM | POA: Diagnosis not present

## 2023-08-12 DIAGNOSIS — F17210 Nicotine dependence, cigarettes, uncomplicated: Secondary | ICD-10-CM | POA: Insufficient documentation

## 2023-08-12 DIAGNOSIS — E611 Iron deficiency: Secondary | ICD-10-CM | POA: Diagnosis not present

## 2023-08-12 DIAGNOSIS — D72819 Decreased white blood cell count, unspecified: Secondary | ICD-10-CM | POA: Insufficient documentation

## 2023-08-12 DIAGNOSIS — K7460 Unspecified cirrhosis of liver: Secondary | ICD-10-CM | POA: Insufficient documentation

## 2023-08-12 DIAGNOSIS — D696 Thrombocytopenia, unspecified: Secondary | ICD-10-CM | POA: Diagnosis not present

## 2023-08-12 LAB — CBC WITH DIFFERENTIAL/PLATELET
Abs Immature Granulocytes: 0.01 10*3/uL (ref 0.00–0.07)
Basophils Absolute: 0 10*3/uL (ref 0.0–0.1)
Basophils Relative: 1 %
Eosinophils Absolute: 0 10*3/uL (ref 0.0–0.5)
Eosinophils Relative: 1 %
HCT: 38 % (ref 36.0–46.0)
Hemoglobin: 12.3 g/dL (ref 12.0–15.0)
Immature Granulocytes: 0 %
Lymphocytes Relative: 21 %
Lymphs Abs: 0.6 10*3/uL — ABNORMAL LOW (ref 0.7–4.0)
MCH: 26.8 pg (ref 26.0–34.0)
MCHC: 32.4 g/dL (ref 30.0–36.0)
MCV: 82.8 fL (ref 80.0–100.0)
Monocytes Absolute: 0.3 10*3/uL (ref 0.1–1.0)
Monocytes Relative: 9 %
Neutro Abs: 2 10*3/uL (ref 1.7–7.7)
Neutrophils Relative %: 68 %
Platelets: 74 10*3/uL — ABNORMAL LOW (ref 150–400)
RBC: 4.59 MIL/uL (ref 3.87–5.11)
RDW: 14.6 % (ref 11.5–15.5)
WBC: 2.9 10*3/uL — ABNORMAL LOW (ref 4.0–10.5)
nRBC: 0 % (ref 0.0–0.2)

## 2023-08-12 LAB — COMPREHENSIVE METABOLIC PANEL WITH GFR
ALT: 21 U/L (ref 0–44)
AST: 41 U/L (ref 15–41)
Albumin: 4 g/dL (ref 3.5–5.0)
Alkaline Phosphatase: 48 U/L (ref 38–126)
Anion gap: 11 (ref 5–15)
BUN: 12 mg/dL (ref 8–23)
CO2: 25 mmol/L (ref 22–32)
Calcium: 9.1 mg/dL (ref 8.9–10.3)
Chloride: 102 mmol/L (ref 98–111)
Creatinine, Ser: 0.8 mg/dL (ref 0.44–1.00)
GFR, Estimated: >60 mL/min (ref >60)
Glucose, Bld: 138 mg/dL — ABNORMAL HIGH (ref 70–99)
Potassium: 4.3 mmol/L (ref 3.5–5.1)
Sodium: 138 mmol/L (ref 135–145)
Total Bilirubin: 0.7 mg/dL (ref 0.0–1.2)
Total Protein: 6.6 g/dL (ref 6.5–8.1)

## 2023-08-13 LAB — IGG, IGA, IGM
IgA: 40 mg/dL — ABNORMAL LOW (ref 87–352)
IgG (Immunoglobin G), Serum: 564 mg/dL — ABNORMAL LOW (ref 586–1602)
IgM (Immunoglobulin M), Srm: 214 mg/dL (ref 26–217)

## 2023-08-13 LAB — BETA 2 MICROGLOBULIN, SERUM: Beta-2 Microglobulin: 3.2 mg/L — ABNORMAL HIGH (ref 0.6–2.4)

## 2023-08-14 ENCOUNTER — Encounter: Payer: Self-pay | Admitting: Oncology

## 2023-08-14 LAB — MULTIPLE MYELOMA PANEL, SERUM
Albumin SerPl Elph-Mcnc: 3.9 g/dL (ref 2.9–4.4)
Albumin/Glob SerPl: 1.9 — ABNORMAL HIGH (ref 0.7–1.7)
Alpha 1: 0.2 g/dL (ref 0.0–0.4)
Alpha2 Glob SerPl Elph-Mcnc: 0.5 g/dL (ref 0.4–1.0)
B-Globulin SerPl Elph-Mcnc: 0.8 g/dL (ref 0.7–1.3)
Gamma Glob SerPl Elph-Mcnc: 0.5 g/dL (ref 0.4–1.8)
Globulin, Total: 2.1 g/dL — ABNORMAL LOW (ref 2.2–3.9)
IgA: 39 mg/dL — ABNORMAL LOW (ref 87–352)
IgG (Immunoglobin G), Serum: 554 mg/dL — ABNORMAL LOW (ref 586–1602)
IgM (Immunoglobulin M), Srm: 210 mg/dL (ref 26–217)
Total Protein ELP: 6 g/dL (ref 6.0–8.5)

## 2023-08-18 ENCOUNTER — Inpatient Hospital Stay: Payer: Medicare Other | Admitting: Oncology

## 2023-08-18 DIAGNOSIS — R161 Splenomegaly, not elsewhere classified: Secondary | ICD-10-CM

## 2023-08-18 DIAGNOSIS — E611 Iron deficiency: Secondary | ICD-10-CM | POA: Diagnosis not present

## 2023-08-18 DIAGNOSIS — D696 Thrombocytopenia, unspecified: Secondary | ICD-10-CM | POA: Diagnosis not present

## 2023-08-18 DIAGNOSIS — Z720 Tobacco use: Secondary | ICD-10-CM | POA: Diagnosis not present

## 2023-08-18 DIAGNOSIS — F17210 Nicotine dependence, cigarettes, uncomplicated: Secondary | ICD-10-CM | POA: Diagnosis not present

## 2023-08-18 DIAGNOSIS — D72810 Lymphocytopenia: Secondary | ICD-10-CM

## 2023-08-18 DIAGNOSIS — D72819 Decreased white blood cell count, unspecified: Secondary | ICD-10-CM | POA: Diagnosis not present

## 2023-08-18 DIAGNOSIS — K7460 Unspecified cirrhosis of liver: Secondary | ICD-10-CM | POA: Diagnosis not present

## 2023-08-18 DIAGNOSIS — E538 Deficiency of other specified B group vitamins: Secondary | ICD-10-CM | POA: Diagnosis not present

## 2023-08-18 NOTE — Assessment & Plan Note (Addendum)
 Patient has mild leukopenia likely secondary to liver cirrhosis no nutritional deficiencies.   Initial peripheral blood flow cytometry concerning for monoclonal B-cell population along with kappa restricted light chains.   Bone marrow biopsy: did not show any clonal B-cell population. No B symptoms  -Repeat labs in 6 months -Will obtain SPEP with next labs -Continue to follow-up with gastroenterology

## 2023-08-18 NOTE — Assessment & Plan Note (Signed)
 Patient has iron deficiency without anemia.  Improved TSAT. -Continue to take oral iron every other day

## 2023-08-18 NOTE — Assessment & Plan Note (Signed)
 Likely secondary to splenomegaly from liver disease.  No signs of bleeding.  -Monitor for signs of bleeding. -Continue follow-up with gastroenterologist for liver disease management. -Repeat labs in 6 months

## 2023-08-18 NOTE — Assessment & Plan Note (Signed)
 Enlarged spleen identified on previous imaging. This could be contributing to the low platelet count.  Likely secondary to liver cirrhosis. -Continue monitoring.

## 2023-08-18 NOTE — Assessment & Plan Note (Signed)
 Patient smokes about a pack a day and has considered quitting. -Encouraged patient to try nicotine gum to aid in smoking cessation.

## 2023-08-18 NOTE — Progress Notes (Signed)
 [facility] at [facility]  HEMATOLOGY FOLLOW-UP VISIT  Darus Engels C  REASON FOR FOLLOW-UP: Leukopenia and thrombocytopenia  ASSESSMENT & PLAN:  Patient is a 63 year old female with past medical history of liver cirrhosis following for leukopenia and thrombocytopenia  Leukopenia Patient has mild leukopenia likely secondary to liver cirrhosis no nutritional deficiencies.   Initial peripheral blood flow cytometry concerning for monoclonal B-cell population along with kappa restricted light chains.   Bone marrow biopsy: did not show any clonal B-cell population. No B symptoms  -Repeat labs in 6 months -Will obtain SPEP with next labs -Continue to follow-up with gastroenterology  Thrombocytopenia (HCC) Likely secondary to splenomegaly from liver disease.  No signs of bleeding.  -Monitor for signs of bleeding. -Continue follow-up with gastroenterologist for liver disease management. -Repeat labs in 6 months  Iron deficiency Patient has iron deficiency without anemia.  Improved TSAT.  -Continue to take oral iron every other day  Splenomegaly Enlarged spleen identified on previous imaging. This could be contributing to the low platelet count.  Likely secondary to liver cirrhosis.  -Continue monitoring.  Tobacco use Patient smokes about a pack a day and has considered quitting.  -Encouraged patient to try nicotine gum to aid in smoking cessation.    Orders Placed This Encounter  Procedures   Ferritin    Standing Status:   Future    Expected Date:   04/18/2024    Expiration Date:   08/17/2024   Folate    Standing Status:   Future    Expected Date:   04/18/2024    Expiration Date:   08/17/2024   Vitamin B12    Standing Status:   Future    Expected Date:   04/18/2024    Expiration Date:   08/17/2024   CBC with Differential/Platelet    Standing Status:   Future    Expected Date:   04/18/2024    Expiration Date:   08/17/2024    Comprehensive metabolic panel with GFR    Standing Status:   Future    Expected Date:   04/18/2024    Expiration Date:   08/17/2024   Kappa/lambda light chains    Standing Status:   Future    Expected Date:   04/18/2024    Expiration Date:   08/17/2024   Multiple Myeloma Panel (SPEP&IFE w/QIG)    Standing Status:   Future    Expected Date:   04/18/2024    Expiration Date:   08/17/2024   Iron and TIBC    Standing Status:   Future    Expected Date:   04/18/2024    Expiration Date:   08/17/2024    The total time spent in the appointment was 20 minutes encounter with patients including review of chart and various tests results, discussions about plan of care and coordination of care plan   All questions were answered. The patient knows to call the clinic with any problems, questions or concerns. No barriers to learning was detected.  Eduardo Grade, MD 4/29/20253:49 PM    INTERVAL HISTORY: Patient is a 63 year old female following for leukopenia and thrombocytopenia. She experiences persistent fatigue despite stable laboratory results. Her white blood cell count remains low, attributed to her liver disease, but she has no fevers or chills. Her platelet count is also low, but she has not experienced any abnormal bleeding.  She is currently taking iron supplements every other day without any side effects such as constipation.  I have reviewed the  past medical history, past surgical history, social history and family history with the patient   ALLERGIES:  is allergic to aspirin, ciprofloxacin, hydrocodone, penicillins, fluocinolone, fosamax [alendronate], and sulfa antibiotics.  MEDICATIONS:  Current Outpatient Medications  Medication Sig Dispense Refill   albuterol  (PROVENTIL  HFA;VENTOLIN  HFA) 108 (90 Base) MCG/ACT inhaler Inhale 2 puffs into the lungs every 6 (six) hours as needed for wheezing or shortness of breath. 3 Inhaler 0   calcium carbonate (OSCAL) 1500 (600 Ca) MG TABS tablet  Take 600 mg of elemental calcium by mouth daily with breakfast.     Cholecalciferol (VITAMIN D3) 50 MCG (2000 UT) capsule Take 2,000 Units by mouth in the morning.     citalopram (CELEXA) 40 MG tablet Take 40 mg by mouth at bedtime.     clonazePAM (KLONOPIN) 0.5 MG tablet Take 0.5 mg by mouth daily as needed.     cyanocobalamin  (VITAMIN B12) 1000 MCG/ML injection Inject into the muscle.     dicyclomine  (BENTYL ) 10 MG capsule Take 1 capsule (10 mg total) by mouth 2 (two) times daily as needed for spasms (loose stools). 60 capsule 0   ferrous sulfate  325 (65 FE) MG EC tablet Take 1 tablet (325 mg total) by mouth every other day. 45 tablet 3   metoprolol  succinate (TOPROL -XL) 25 MG 24 hr tablet Take 12.5 mg by mouth in the morning.     naproxen sodium (ALEVE) 220 MG tablet Take 220 mg by mouth 2 (two) times daily as needed (pain.).     nitroGLYCERIN  (NITROSTAT ) 0.4 MG SL tablet Place 0.4 mg under the tongue every 5 (five) minutes x 3 doses as needed for chest pain.     pantoprazole  (PROTONIX ) 40 MG tablet Take 1 tablet (40 mg total) by mouth daily. 30 tablet 11   rosuvastatin (CRESTOR) 5 MG tablet Take 5 mg by mouth at bedtime.     No current facility-administered medications for this visit.     REVIEW OF SYSTEMS:   Constitutional: Denies fevers, chills  Eyes: Denies blurriness of vision Ears, nose, mouth, throat, and face: Denies mucositis or sore throat Respiratory: Denies cough, dyspnea or wheezes Cardiovascular: Denies palpitation, chest discomfort or lower extremity swelling Gastrointestinal:  Denies nausea, heartburn or change in bowel habits Skin: Denies abnormal skin rashes Lymphatics: Denies new lymphadenopathy or easy bruising Neurological:Denies numbness, tingling or new weaknesses Behavioral/Psych: Mood is stable, no new changes  All other systems were reviewed with the patient and are negative.  PHYSICAL EXAMINATION:   There were no vitals filed for this  visit.   GENERAL:alert, no distress and comfortable LYMPH:  no palpable lymphadenopathy in the cervical, axillary or inguinal LUNGS: clear to auscultation and percussion with normal breathing effort HEART: regular rate & rhythm and no murmurs and no lower extremity edema ABDOMEN:abdomen soft, non-tender and normal bowel sounds.  Restricted due to abdominal fat.  Could not palpate spleen. Musculoskeletal:no cyanosis of digits and no clubbing  NEURO: alert & oriented x 3 with fluent speech  LABORATORY DATA:  I have reviewed the data as listed  Lab Results  Component Value Date   WBC 2.9 (L) 08/12/2023   NEUTROABS 2.0 08/12/2023   HGB 12.3 08/12/2023   HCT 38.0 08/12/2023   MCV 82.8 08/12/2023   PLT 74 (L) 08/12/2023      Component Value Date/Time   NA 138 08/12/2023 1027   K 4.3 08/12/2023 1027   CL 102 08/12/2023 1027   CO2 25 08/12/2023 1027   GLUCOSE 138 (  H) 08/12/2023 1027   BUN 12 08/12/2023 1027   CREATININE 0.80 08/12/2023 1027   CREATININE 0.73 07/15/2021 1302   CALCIUM 9.1 08/12/2023 1027   PROT 6.6 08/12/2023 1027   ALBUMIN 4.0 08/12/2023 1027   AST 41 08/12/2023 1027   ALT 21 08/12/2023 1027   ALKPHOS 48 08/12/2023 1027   BILITOT 0.7 08/12/2023 1027   GFRNONAA >60 08/12/2023 1027   GFRNONAA >89 04/18/2015 1057   GFRAA >60 11/18/2017 1102   GFRAA >89 04/18/2015 1057       Chemistry      Component Value Date/Time   NA 138 08/12/2023 1027   K 4.3 08/12/2023 1027   CL 102 08/12/2023 1027   CO2 25 08/12/2023 1027   BUN 12 08/12/2023 1027   CREATININE 0.80 08/12/2023 1027   CREATININE 0.73 07/15/2021 1302      Component Value Date/Time   CALCIUM 9.1 08/12/2023 1027   ALKPHOS 48 08/12/2023 1027   AST 41 08/12/2023 1027   ALT 21 08/12/2023 1027   BILITOT 0.7 08/12/2023 1027      Latest Reference Range & Units 04/27/23 13:55 08/12/23 10:27  Iron 28 - 170 ug/dL 469   UIBC ug/dL 629   TIBC 528 - 413 ug/dL 244   Saturation Ratios 10.4 - 31.8 % 28    Ferritin 11 - 307 ng/mL 28   Beta-2 Microglobulin 0.6 - 2.4 mg/L  3.2 (H)  Total Protein ELP 6.0 - 8.5 g/dL  6.0 (C)  Albumin SerPl Elph-Mcnc 2.9 - 4.4 g/dL  3.9 (C)  Albumin/Glob SerPl 0.7 - 1.7   1.9 (H) (C)  Alpha2 Glob SerPl Elph-Mcnc 0.4 - 1.0 g/dL  0.5 (C)  Alpha 1 0.0 - 0.4 g/dL  0.2 (C)  Gamma Glob SerPl Elph-Mcnc 0.4 - 1.8 g/dL  0.5 (C)  M Protein SerPl Elph-Mcnc Not Observed g/dL  Not Observed (C)  IFE 1   Comment ! (C)  Globulin, Total 2.2 - 3.9 g/dL  2.1 (L) (C)  B-Globulin SerPl Elph-Mcnc 0.7 - 1.3 g/dL  0.8 (C)  IgG (Immunoglobin G), Serum 586 - 1,602 mg/dL 010 - 2,725 mg/dL  366 (L) 440 (L)  IgM (Immunoglobulin M), Srm 26 - 217 mg/dL 26 - 347 mg/dL  425 956  IgA 87 - 387 mg/dL 87 - 564 mg/dL  39 (L) 40 (L)  (H): Data is abnormally high !: Data is abnormal (L): Data is abnormally low (C): Corrected  Pathology: Bone marrow biopsy:  DIAGNOSIS:  Bone marrow, aspirate, flow cytometry: -  1% CD34 positive blasts, not increased. -  No definitive immunophenotypic evidence of a lymphoproliferative disorder.  Note: It is noted that there is a previous history of a kappa clonal population the peripheral blood.  The bone marrow flow cytometry does show a mild kappa predominance; however, this is on a low level of overall B cells and the pattern is somewhat artifactual in nature.  A definitive clonal population of B cells is not detected

## 2023-09-07 ENCOUNTER — Other Ambulatory Visit: Payer: Self-pay | Admitting: Internal Medicine

## 2023-09-07 NOTE — Telephone Encounter (Signed)
 She is overdue follow up for cirrhosis.

## 2023-09-08 ENCOUNTER — Encounter: Payer: Self-pay | Admitting: Gastroenterology

## 2023-09-08 NOTE — Telephone Encounter (Signed)
 Mailed a letter to patient to call to schedule an appointment

## 2023-09-22 DIAGNOSIS — J449 Chronic obstructive pulmonary disease, unspecified: Secondary | ICD-10-CM | POA: Diagnosis not present

## 2023-09-22 DIAGNOSIS — R5383 Other fatigue: Secondary | ICD-10-CM | POA: Diagnosis not present

## 2023-09-22 DIAGNOSIS — E538 Deficiency of other specified B group vitamins: Secondary | ICD-10-CM | POA: Diagnosis not present

## 2023-09-22 DIAGNOSIS — K7460 Unspecified cirrhosis of liver: Secondary | ICD-10-CM | POA: Diagnosis not present

## 2023-09-22 DIAGNOSIS — Z299 Encounter for prophylactic measures, unspecified: Secondary | ICD-10-CM | POA: Diagnosis not present
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
## 2023-10-26 ENCOUNTER — Ambulatory Visit: Admitting: Gastroenterology

## 2023-11-12 DIAGNOSIS — E538 Deficiency of other specified B group vitamins: Secondary | ICD-10-CM | POA: Diagnosis not present

## 2023-11-16 ENCOUNTER — Ambulatory Visit: Admitting: Gastroenterology

## 2023-11-16 ENCOUNTER — Telehealth: Payer: Self-pay | Admitting: *Deleted

## 2023-11-16 ENCOUNTER — Encounter: Payer: Self-pay | Admitting: Gastroenterology

## 2023-11-16 VITALS — BP 93/62 | HR 91 | Temp 98.6°F | Ht 66.0 in | Wt 171.0 lb

## 2023-11-16 DIAGNOSIS — K760 Fatty (change of) liver, not elsewhere classified: Secondary | ICD-10-CM

## 2023-11-16 DIAGNOSIS — R161 Splenomegaly, not elsewhere classified: Secondary | ICD-10-CM

## 2023-11-16 DIAGNOSIS — K591 Functional diarrhea: Secondary | ICD-10-CM | POA: Diagnosis not present

## 2023-11-16 DIAGNOSIS — K219 Gastro-esophageal reflux disease without esophagitis: Secondary | ICD-10-CM | POA: Diagnosis not present

## 2023-11-16 DIAGNOSIS — K7460 Unspecified cirrhosis of liver: Secondary | ICD-10-CM

## 2023-11-16 DIAGNOSIS — R197 Diarrhea, unspecified: Secondary | ICD-10-CM

## 2023-11-16 MED ORDER — RIFAXIMIN 550 MG PO TABS
550.0000 mg | ORAL_TABLET | Freq: Three times a day (TID) | ORAL | 0 refills | Status: AC
Start: 2023-11-16 — End: 2023-11-30

## 2023-11-16 NOTE — Telephone Encounter (Signed)
 Called pt. Destiny Villanueva of [place]  appt. Also sent to mychart.

## 2023-11-16 NOTE — Progress Notes (Signed)
 GI Office Note    Referring Provider: Hairfield, Keavie C, NP Primary Care Physician:  Lori Thedora BROCKS  Primary Gastroenterologist: Ozell Hollingshead, MD   Chief Complaint   Chief Complaint  Patient presents with   Follow-up    Here for follow up on cirrhosis, states that she is having issues with a lot of diarrhea.    History of Present Illness   Destiny Villanueva is a 63 y.o. female presenting today for follow up. Last seen 12/2022. She has a history of adenomatous colon polyps, GERD, fatty liver with splenomegaly/thrombocytopenia, history of elevated AFP.   Followed by hematology for chronic thrombocytopenia/splenomegaly.  Ultrasound in January 2023 with concern for cirrhosis.  She had mildly elevated AFP followed by MRI (07/2021) with no evidence of hepatic mass or MRI evidence of cirrhotic liver but she did have steatosis and splenomegaly. U/S 01/2022 with splenomegaly/fatty liver. MELD Na of 6 07/2022. She received one shot of Hep A/B vaccine, labs recently showed she is immune to hepatitis A but not Hep B. States pharmacy needs prior authorization to get Hep B approved. Unclear if she ever completed.      Labs back in 01/2023, MELD 3.0 of 8. AFP normal. See labs below.    Today:  Has had more issues with diarrhea again. She had been doing well until past 10 days. She developed loose stools. BM couple per day. Worse after first meal of day, which is at lunch. Bentyl  helps some. Denies recent antibiotics. She has had some abdominal cramping resolves with BMs. No nocturnal stools. Keeps a lot of gas. No melena, brbpr. For past five days stools some improved, soft but some form. Heartburn ok typically. Recent flare with eating tomatoes. Weight stable.      CT chest/abd/pelvis with contrast 03/2023: IMPRESSION: 1. Splenomegaly with prominent abdominal lymph nodes similar dating back to October 16, 2015 and prominent thoracic lymph nodes, stable dating back to Aug 29 2016. 2.  Hepatomegaly with diffuse hepatic steatosis. 3. Gas fluid levels in a patulous esophagus, which can be seen in the setting of gastroesophageal reflux. 4.  Aortic Atherosclerosis (ICD10-I70.0).  EGD June 2024: -Mild Schatzki ring -No varices -Hiatal hernia -Normal duodenal bulb and second portion of duodenum -repeat EGD in 2-3 years   Colonoscopy 09/2022: -Entire examined colon normal -Distal rectum and anal verge normal -No specimens collected -Repeat colonoscopy in 5 years  Medications   Current Outpatient Medications  Medication Sig Dispense Refill   albuterol  (PROVENTIL  HFA;VENTOLIN  HFA) 108 (90 Base) MCG/ACT inhaler Inhale 2 puffs into the lungs every 6 (six) hours as needed for wheezing or shortness of breath. 3 Inhaler 0   calcium carbonate (OSCAL) 1500 (600 Ca) MG TABS tablet Take 600 mg of elemental calcium by mouth daily with breakfast.     Cholecalciferol (VITAMIN D3) 50 MCG (2000 UT) capsule Take 2,000 Units by mouth in the morning.     citalopram (CELEXA) 40 MG tablet Take 40 mg by mouth at bedtime.     clonazePAM (KLONOPIN) 0.5 MG tablet Take 0.5 mg by mouth daily as needed.     cyanocobalamin  (VITAMIN B12) 1000 MCG/ML injection Inject into the muscle.     dicyclomine  (BENTYL ) 10 MG capsule Take 1 capsule (10 mg total) by mouth 2 (two) times daily as needed for spasms (loose stools). 60 capsule 0   FEROSUL 325 (65 Fe) MG tablet Take 325 mg by mouth every other day.     metoprolol  succinate (TOPROL -XL)  25 MG 24 hr tablet Take 12.5 mg by mouth in the morning.     naproxen sodium (ALEVE) 220 MG tablet Take 220 mg by mouth 2 (two) times daily as needed (pain.).     nitroGLYCERIN  (NITROSTAT ) 0.4 MG SL tablet Place 0.4 mg under the tongue every 5 (five) minutes x 3 doses as needed for chest pain.     pantoprazole  (PROTONIX ) 40 MG tablet Take 1 tablet by mouth once daily 30 tablet 11   rosuvastatin (CRESTOR) 5 MG tablet Take 5 mg by mouth at bedtime.     No current  facility-administered medications for this visit.    Allergies   Allergies as of 11/16/2023 - Review Complete 11/16/2023  Allergen Reaction Noted   Aspirin Nausea And Vomiting 07/03/2014   Ciprofloxacin  07/03/2014   Hydrocodone Hives 03/27/2021   Penicillins Hives 07/03/2014   Fluocinolone Dermatitis 07/18/2013   Fosamax [alendronate] Other (See Comments) 07/18/2013   Sulfa antibiotics Rash 02/08/2019    Review of Systems   General: Negative for anorexia, weight loss, fever, chills, fatigue, weakness. ENT: Negative for hoarseness, difficulty swallowing , nasal congestion. CV: Negative for chest pain, angina, palpitations, dyspnea on exertion, peripheral edema.  Respiratory: Negative for dyspnea at rest, dyspnea on exertion, cough, sputum, wheezing.  GI: See history of present illness. GU:  Negative for dysuria, hematuria, urinary incontinence, urinary frequency, nocturnal urination.  Endo: Negative for unusual weight change.     Physical Exam   BP 93/62 (BP Location: Right Arm, Patient Position: Sitting, Cuff Size: Normal)   Pulse 91   Temp 98.6 F (37 C) (Oral)   Ht 5' 6 (1.676 m)   Wt 171 lb (77.6 kg)   SpO2 97%   BMI 27.60 kg/m    General: Well-nourished, well-developed in no acute distress.  Eyes: No icterus. Mouth: Oropharyngeal mucosa moist and pink   Lungs: Clear to auscultation bilaterally.  Heart: Regular rate and rhythm, no murmurs rubs or gallops.  Abdomen: Bowel sounds are normal, nontender, nondistended, no hepatosplenomegaly or masses,  no abdominal bruits or hernia , no rebound or guarding.  Rectal: not performed Extremities: No lower extremity edema. No clubbing or deformities. Neuro: Alert and oriented x 4   Skin: Warm and dry, no jaundice.   Psych: Alert and cooperative, normal mood and affect.  Labs   Lab Results  Component Value Date   NA 138 08/12/2023   CL 102 08/12/2023   K 4.3 08/12/2023   CO2 25 08/12/2023   BUN 12 08/12/2023    CREATININE 0.80 08/12/2023   GFRNONAA >60 08/12/2023   CALCIUM 9.1 08/12/2023   ALBUMIN 4.0 08/12/2023   GLUCOSE 138 (H) 08/12/2023   Lab Results  Component Value Date   ALT 21 08/12/2023   AST 41 08/12/2023   ALKPHOS 48 08/12/2023   BILITOT 0.7 08/12/2023   Lab Results  Component Value Date   WBC 2.9 (L) 08/12/2023   HGB 12.3 08/12/2023   HCT 38.0 08/12/2023   MCV 82.8 08/12/2023   PLT 74 (L) 08/12/2023   Lab Results  Component Value Date   INR 1.1 02/11/2023   INR 1.0 09/29/2022   INR 1.0 07/21/2022   Lab Results  Component Value Date   IRON 114 04/27/2023   TIBC 415 04/27/2023   FERRITIN 28 04/27/2023   Lab Results  Component Value Date   VITAMINB12 813 03/13/2023   Lab Results  Component Value Date   FOLATE 7.0 03/13/2023   AFP 6.4 01/2023  Imaging Studies   No results found.  Assessment/Plan:   Fatty liver, splenomegaly concern for cirrhosis: -labs for AFP, PT/INR, CBC, CMET -ruq u/s -ov in 6 months  GERD:  -Continue pantoprazole  40mg  daily -reinforced anti-reflux measures  Diarrhea: possible IBS-D, colonoscopy up to date -Xifaxan  550mg  TID for 14 days if recurrent diarrhea       Destiny Villanueva, MHS, PA-C [facility]

## 2023-11-16 NOTE — Patient Instructions (Addendum)
  I have sent in RX for Xifaxan  to take three times a day for 14 days if your diarrhea returns.  Please update labs at [facility].  Liver ultrasound in the near future.   Return office visit in six months.

## 2023-11-17 ENCOUNTER — Ambulatory Visit: Payer: Self-pay | Admitting: Gastroenterology

## 2023-11-17 LAB — COMPREHENSIVE METABOLIC PANEL WITH GFR
ALT: 18 IU/L (ref 0–32)
AST: 33 IU/L (ref 0–40)
Albumin: 4.4 g/dL (ref 3.9–4.9)
Alkaline Phosphatase: 55 IU/L (ref 44–121)
BUN/Creatinine Ratio: 11 — ABNORMAL LOW (ref 12–28)
BUN: 9 mg/dL (ref 8–27)
Bilirubin Total: 0.4 mg/dL (ref 0.0–1.2)
CO2: 23 mmol/L (ref 20–29)
Calcium: 9.3 mg/dL (ref 8.7–10.3)
Chloride: 102 mmol/L (ref 96–106)
Creatinine, Ser: 0.85 mg/dL (ref 0.57–1.00)
Globulin, Total: 1.8 g/dL (ref 1.5–4.5)
Glucose: 116 mg/dL — ABNORMAL HIGH (ref 70–99)
Potassium: 4.4 mmol/L (ref 3.5–5.2)
Sodium: 140 mmol/L (ref 134–144)
Total Protein: 6.2 g/dL (ref 6.0–8.5)
eGFR: 77 mL/min/1.73 (ref >59)

## 2023-11-17 LAB — CBC WITH DIFFERENTIAL/PLATELET
Basophils Absolute: 0 x10E3/uL (ref 0.0–0.2)
Basos: 1 %
EOS (ABSOLUTE): 0.1 x10E3/uL (ref 0.0–0.4)
Eos: 2 %
Hematocrit: 41.7 % (ref 34.0–46.6)
Hemoglobin: 13.3 g/dL (ref 11.1–15.9)
Immature Grans (Abs): 0 x10E3/uL (ref 0.0–0.1)
Immature Granulocytes: 0 %
Lymphocytes Absolute: 0.9 x10E3/uL (ref 0.7–3.1)
Lymphs: 26 %
MCH: 26.9 pg (ref 26.6–33.0)
MCHC: 31.9 g/dL (ref 31.5–35.7)
MCV: 84 fL (ref 79–97)
Monocytes Absolute: 0.2 x10E3/uL (ref 0.1–0.9)
Monocytes: 6 %
Neutrophils Absolute: 2.1 x10E3/uL (ref 1.4–7.0)
Neutrophils: 65 %
Platelets: 80 x10E3/uL — CL (ref 150–450)
RBC: 4.94 x10E6/uL (ref 3.77–5.28)
RDW: 13.4 % (ref 11.7–15.4)
WBC: 3.3 x10E3/uL — ABNORMAL LOW (ref 3.4–10.8)

## 2023-11-17 LAB — PROTIME-INR
INR: 1 (ref 0.9–1.2)
Prothrombin Time: 10.7 s (ref 9.1–12.0)

## 2023-11-17 LAB — AFP TUMOR MARKER: AFP, Serum, Tumor Marker: 7.1 ng/mL (ref 0.0–9.2)

## 2023-11-18 NOTE — Telephone Encounter (Signed)
 Pt is referring to Xifaxan 

## 2023-11-18 NOTE — Telephone Encounter (Signed)
 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 She has been using bentyl  which has helped some.  We can have her continue bentyl , increasing to three times daily. She would likely need new RX??  And/or she can add IBgard buy OTC, take as per package instructions

## 2023-11-24 ENCOUNTER — Ambulatory Visit (HOSPITAL_COMMUNITY)
Admission: RE | Admit: 2023-11-24 | Discharge: 2023-11-24 | Disposition: A | Discharge status: Home / Self Care | Source: Ambulatory Visit | Attending: Gastroenterology | Admitting: Gastroenterology

## 2023-11-24 DIAGNOSIS — K591 Functional diarrhea: Secondary | ICD-10-CM | POA: Insufficient documentation

## 2023-11-24 DIAGNOSIS — K7460 Unspecified cirrhosis of liver: Secondary | ICD-10-CM | POA: Insufficient documentation

## 2023-11-24 DIAGNOSIS — K219 Gastro-esophageal reflux disease without esophagitis: Secondary | ICD-10-CM | POA: Insufficient documentation

## 2023-11-24 DIAGNOSIS — Z9049 Acquired absence of other specified parts of digestive tract: Secondary | ICD-10-CM | POA: Diagnosis not present

## 2023-11-24 DIAGNOSIS — K7689 Other specified diseases of liver: Secondary | ICD-10-CM | POA: Diagnosis not present

## 2023-11-24 DIAGNOSIS — R161 Splenomegaly, not elsewhere classified: Secondary | ICD-10-CM | POA: Diagnosis not present

## 2023-12-10 DIAGNOSIS — D72819 Decreased white blood cell count, unspecified: Secondary | ICD-10-CM | POA: Diagnosis not present

## 2023-12-10 DIAGNOSIS — E782 Mixed hyperlipidemia: Secondary | ICD-10-CM | POA: Diagnosis not present

## 2023-12-10 DIAGNOSIS — R161 Splenomegaly, not elsewhere classified: Secondary | ICD-10-CM | POA: Diagnosis not present

## 2023-12-10 DIAGNOSIS — D696 Thrombocytopenia, unspecified: Secondary | ICD-10-CM | POA: Diagnosis not present

## 2023-12-10 DIAGNOSIS — J449 Chronic obstructive pulmonary disease, unspecified: Secondary | ICD-10-CM | POA: Diagnosis not present

## 2023-12-10 DIAGNOSIS — F17210 Nicotine dependence, cigarettes, uncomplicated: Secondary | ICD-10-CM | POA: Diagnosis not present

## 2023-12-10 DIAGNOSIS — I2510 Atherosclerotic heart disease of native coronary artery without angina pectoris: Secondary | ICD-10-CM | POA: Diagnosis not present

## 2023-12-15 DIAGNOSIS — E538 Deficiency of other specified B group vitamins: Secondary | ICD-10-CM | POA: Diagnosis not present

## 2023-12-24 DIAGNOSIS — E538 Deficiency of other specified B group vitamins: Secondary | ICD-10-CM | POA: Diagnosis not present

## 2023-12-24 DIAGNOSIS — J449 Chronic obstructive pulmonary disease, unspecified: Secondary | ICD-10-CM | POA: Diagnosis not present

## 2023-12-24 DIAGNOSIS — Z299 Encounter for prophylactic measures, unspecified: Secondary | ICD-10-CM | POA: Diagnosis not present

## 2023-12-24 DIAGNOSIS — F17210 Nicotine dependence, cigarettes, uncomplicated: Secondary | ICD-10-CM | POA: Diagnosis not present

## 2023-12-28 ENCOUNTER — Ambulatory Visit (HOSPITAL_COMMUNITY)
Admission: RE | Admit: 2023-12-28 | Discharge: 2023-12-28 | Disposition: A | Discharge status: Home / Self Care | Source: Ambulatory Visit | Attending: Acute Care | Admitting: Acute Care

## 2023-12-28 DIAGNOSIS — F17210 Nicotine dependence, cigarettes, uncomplicated: Secondary | ICD-10-CM | POA: Diagnosis not present

## 2023-12-28 DIAGNOSIS — Z87891 Personal history of nicotine dependence: Secondary | ICD-10-CM | POA: Diagnosis not present

## 2023-12-28 DIAGNOSIS — Z122 Encounter for screening for malignant neoplasm of respiratory organs: Secondary | ICD-10-CM | POA: Insufficient documentation

## 2024-01-11 ENCOUNTER — Other Ambulatory Visit: Payer: Self-pay | Admitting: Acute Care

## 2024-01-11 DIAGNOSIS — Z87891 Personal history of nicotine dependence: Secondary | ICD-10-CM

## 2024-01-11 DIAGNOSIS — Z122 Encounter for screening for malignant neoplasm of respiratory organs: Secondary | ICD-10-CM

## 2024-01-11 DIAGNOSIS — F17210 Nicotine dependence, cigarettes, uncomplicated: Secondary | ICD-10-CM

## 2024-01-18 DIAGNOSIS — N390 Urinary tract infection, site not specified: Secondary | ICD-10-CM | POA: Diagnosis not present

## 2024-01-18 DIAGNOSIS — Z299 Encounter for prophylactic measures, unspecified: Secondary | ICD-10-CM | POA: Diagnosis not present

## 2024-01-18 DIAGNOSIS — R350 Frequency of micturition: Secondary | ICD-10-CM | POA: Diagnosis not present

## 2024-02-10 ENCOUNTER — Inpatient Hospital Stay: Attending: Oncology

## 2024-02-10 DIAGNOSIS — D472 Monoclonal gammopathy: Secondary | ICD-10-CM | POA: Diagnosis not present

## 2024-02-10 DIAGNOSIS — D696 Thrombocytopenia, unspecified: Secondary | ICD-10-CM | POA: Diagnosis not present

## 2024-02-10 DIAGNOSIS — D72810 Lymphocytopenia: Secondary | ICD-10-CM

## 2024-02-10 DIAGNOSIS — K7460 Unspecified cirrhosis of liver: Secondary | ICD-10-CM | POA: Diagnosis not present

## 2024-02-10 DIAGNOSIS — F17210 Nicotine dependence, cigarettes, uncomplicated: Secondary | ICD-10-CM | POA: Insufficient documentation

## 2024-02-10 DIAGNOSIS — E538 Deficiency of other specified B group vitamins: Secondary | ICD-10-CM | POA: Diagnosis not present

## 2024-02-10 DIAGNOSIS — D72819 Decreased white blood cell count, unspecified: Secondary | ICD-10-CM | POA: Diagnosis present

## 2024-02-10 DIAGNOSIS — R161 Splenomegaly, not elsewhere classified: Secondary | ICD-10-CM | POA: Diagnosis not present

## 2024-02-10 LAB — CBC WITH DIFFERENTIAL/PLATELET
Abs Immature Granulocytes: 0.01 K/uL (ref 0.00–0.07)
Basophils Absolute: 0 K/uL (ref 0.0–0.1)
Basophils Relative: 1 %
Eosinophils Absolute: 0 K/uL (ref 0.0–0.5)
Eosinophils Relative: 1 %
HCT: 40.5 % (ref 36.0–46.0)
Hemoglobin: 12.9 g/dL (ref 12.0–15.0)
Immature Granulocytes: 0 %
Lymphocytes Relative: 23 %
Lymphs Abs: 0.7 K/uL (ref 0.7–4.0)
MCH: 26.6 pg (ref 26.0–34.0)
MCHC: 31.9 g/dL (ref 30.0–36.0)
MCV: 83.5 fL (ref 80.0–100.0)
Monocytes Absolute: 0.2 K/uL (ref 0.1–1.0)
Monocytes Relative: 6 %
Neutro Abs: 1.9 K/uL (ref 1.7–7.7)
Neutrophils Relative %: 69 %
Platelets: 77 K/uL — ABNORMAL LOW (ref 150–400)
RBC: 4.85 MIL/uL (ref 3.87–5.11)
RDW: 14.2 % (ref 11.5–15.5)
Smear Review: NORMAL
WBC: 2.8 K/uL — ABNORMAL LOW (ref 4.0–10.5)
nRBC: 0 % (ref 0.0–0.2)

## 2024-02-10 LAB — IRON AND TIBC
Iron: 44 ug/dL (ref 28–170)
Saturation Ratios: 11 % (ref 10.4–31.8)
TIBC: 391 ug/dL (ref 250–450)
UIBC: 347 ug/dL

## 2024-02-10 LAB — COMPREHENSIVE METABOLIC PANEL WITH GFR
ALT: 21 U/L (ref 0–44)
AST: 45 U/L — ABNORMAL HIGH (ref 15–41)
Albumin: 4.5 g/dL (ref 3.5–5.0)
Alkaline Phosphatase: 56 U/L (ref 38–126)
Anion gap: 10 (ref 5–15)
BUN: 11 mg/dL (ref 8–23)
CO2: 28 mmol/L (ref 22–32)
Calcium: 9.5 mg/dL (ref 8.9–10.3)
Chloride: 99 mmol/L (ref 98–111)
Creatinine, Ser: 0.81 mg/dL (ref 0.44–1.00)
GFR, Estimated: >60 mL/min (ref >60)
Glucose, Bld: 147 mg/dL — ABNORMAL HIGH (ref 70–99)
Potassium: 4.3 mmol/L (ref 3.5–5.1)
Sodium: 137 mmol/L (ref 135–145)
Total Bilirubin: 0.5 mg/dL (ref 0.0–1.2)
Total Protein: 6.7 g/dL (ref 6.5–8.1)

## 2024-02-10 LAB — FERRITIN: Ferritin: 92 ng/mL (ref 11–307)

## 2024-02-10 LAB — FOLATE: Folate: 5.7 ng/mL — ABNORMAL LOW (ref >5.9)

## 2024-02-10 LAB — VITAMIN B12: Vitamin B-12: 636 pg/mL (ref 180–914)

## 2024-02-11 LAB — KAPPA/LAMBDA LIGHT CHAINS
Kappa free light chain: 28.3 mg/L — ABNORMAL HIGH (ref 3.3–19.4)
Kappa, lambda light chain ratio: 1.08 (ref 0.26–1.65)
Lambda free light chains: 26.2 mg/L (ref 5.7–26.3)

## 2024-02-15 LAB — MULTIPLE MYELOMA PANEL, SERUM
Albumin SerPl Elph-Mcnc: 3.9 g/dL (ref 2.9–4.4)
Albumin/Glob SerPl: 1.4 (ref 0.7–1.7)
Alpha 1: 0.3 g/dL (ref 0.0–0.4)
Alpha2 Glob SerPl Elph-Mcnc: 0.7 g/dL (ref 0.4–1.0)
B-Globulin SerPl Elph-Mcnc: 1 g/dL (ref 0.7–1.3)
Gamma Glob SerPl Elph-Mcnc: 0.8 g/dL (ref 0.4–1.8)
Globulin, Total: 2.8 g/dL (ref 2.2–3.9)
IgA: 44 mg/dL — ABNORMAL LOW (ref 87–352)
IgG (Immunoglobin G), Serum: 572 mg/dL — ABNORMAL LOW (ref 586–1602)
IgM (Immunoglobulin M), Srm: 290 mg/dL — ABNORMAL HIGH (ref 26–217)
M Protein SerPl Elph-Mcnc: 0.2 g/dL — ABNORMAL HIGH
Total Protein ELP: 6.7 g/dL (ref 6.0–8.5)

## 2024-02-17 ENCOUNTER — Inpatient Hospital Stay: Admitting: Oncology

## 2024-02-17 VITALS — BP 127/76 | HR 92 | Temp 97.1°F | Resp 18 | Ht 66.0 in | Wt 175.4 lb

## 2024-02-17 DIAGNOSIS — Z720 Tobacco use: Secondary | ICD-10-CM

## 2024-02-17 DIAGNOSIS — D72819 Decreased white blood cell count, unspecified: Secondary | ICD-10-CM

## 2024-02-17 DIAGNOSIS — E611 Iron deficiency: Secondary | ICD-10-CM

## 2024-02-17 DIAGNOSIS — R161 Splenomegaly, not elsewhere classified: Secondary | ICD-10-CM | POA: Diagnosis not present

## 2024-02-17 DIAGNOSIS — D696 Thrombocytopenia, unspecified: Secondary | ICD-10-CM | POA: Diagnosis not present

## 2024-02-17 DIAGNOSIS — D472 Monoclonal gammopathy: Secondary | ICD-10-CM

## 2024-02-17 DIAGNOSIS — E538 Deficiency of other specified B group vitamins: Secondary | ICD-10-CM

## 2024-02-17 NOTE — Patient Instructions (Signed)
 [facility] at [facility] Discharge Instructions   You were seen and examined today by Dr. Davonna.  She reviewed the results of your lab work which are normal/stable.   We will see you back in 6 months. We will repeat lab work prior to this visit.   Return as scheduled.    Thank you for choosing  Cancer Center at [facility] to provide your oncology and hematology care.  To afford each patient quality time with our provider, please arrive at least 15 minutes before your scheduled appointment time.   If you have a lab appointment with the Cancer Center please come in thru the Main Entrance and check in at the main information desk.  You need to re-schedule your appointment should you arrive 10 or more minutes late.  We strive to give you quality time with our providers, and arriving late affects you and other patients whose appointments are after yours.  Also, if you no show three or more times for appointments you may be dismissed from the clinic at the providers discretion.     Again, thank you for choosing [facility].  Our hope is that these requests will decrease the amount of time that you wait before being seen by our physicians.       _____________________________________________________________  Should you have questions after your visit to [facility], please contact our office at [phone] and follow the prompts.  Our office hours are 8:00 a.m. and 4:30 p.m. Monday - Friday.  Please note that voicemails left after 4:00 p.m. may not be returned until the following business day.  We are closed weekends and major holidays.  You do have access to a nurse 24-7, just call the main number to the clinic [phone] and do not press any options, hold on the line and a nurse will answer the phone.    For prescription refill requests, have your pharmacy contact our office and allow 72 hours.    Due to Covid, you will  need to wear a mask upon entering the hospital. If you do not have a mask, a mask will be given to you at the Main Entrance upon arrival. For doctor visits, patients may have 1 support person age 4 or older with them. For treatment visits, patients can not have anyone with them due to social distancing guidelines and our immunocompromised population.

## 2024-02-17 NOTE — Progress Notes (Signed)
 [facility] at [facility]  HEMATOLOGY FOLLOW-UP VISIT  Destiny Villanueva  REASON FOR FOLLOW-UP: Leukopenia and thrombocytopenia  ASSESSMENT & PLAN:  Patient is a 63 year old female with past medical history of liver cirrhosis following for leukopenia and thrombocytopenia  Leukopenia Patient has mild leukopenia likely secondary to liver cirrhosis no nutritional deficiencies.   Initial peripheral blood flow cytometry concerning for monoclonal B-cell population along with kappa restricted light chains.   Bone marrow biopsy: did not show any clonal B-cell population. 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   RTC in 6 months for follow -up with labs.    Thrombocytopenia (HCC) Likely secondary to splenomegaly from liver disease.  No signs of bleeding.  -Monitor for signs of bleeding. -Continue follow-up with gastroenterologist for liver disease management. -Repeat labs in 6 months   Iron deficiency Patient has iron deficiency without anemia.  TSAT: 11, ferritin: 92  -Continue to take oral iron every other day  Folate deficiency Folate of 5.7  - Start folic acid  1 mg daily   Splenomegaly Enlarged spleen identified on previous imaging. This could be contributing to the low platelet count.  Likely secondary to liver cirrhosis. -Continue monitoring.   Tobacco use Patient smokes  about a pack a day and has considered quitting. -Encouraged patient to try nicotine gum to aid in smoking cessation.   Orders Placed This Encounter  Procedures   CBC with Differential    Standing Status:   Future    Expected Date:   08/15/2024    Expiration Date:   11/13/2024   Comprehensive metabolic panel    Standing Status:   Future    Expected Date:   08/15/2024    Expiration Date:   11/13/2024   Kappa/lambda light chains    Standing Status:   Future    Expected Date:   08/15/2024    Expiration Date:   11/13/2024   Immunofixation electrophoresis    Standing Status:   Future    Expected Date:   08/15/2024    Expiration Date:   11/13/2024   Protein electrophoresis, serum    Standing Status:   Future    Expected Date:   08/15/2024    Expiration Date:   11/13/2024    The total time spent in the appointment was 20 minutes encounter with patients including review of chart and various tests results, discussions about plan of care and coordination of care plan   All questions were answered. The patient knows to call the clinic with any problems, questions or concerns. No barriers to learning was detected.  I, Marijo Sharps, acting as a neurosurgeon for [employer], MD.,have documented all relevant documentation on the behalf of Destiny Dry, MD,as directed by  Destiny Dry, MD while in the presence of Destiny Dry, MD.  I, Destiny Dry MD, have reviewed the above documentation for accuracy and completeness, and I agree with the above.  Destiny Dry, MD 10/29/202510:57 PM    INTERVAL HISTORY: Patient is a 63 year old female following for leukopenia and thrombocytopenia.   [place] notes feeling tired. She notes having little to no appetite, but no weight loss. She denies fevers, chills, blood in stools, abdominal pains, or night sweats.   She is still taking iron supplements every other day. She is still an active smoker.   I have reviewed the past medical history, past  surgical history, social history and family history with the patient   ALLERGIES:  is allergic to aspirin, ciprofloxacin, hydrocodone, penicillins, fluocinolone, fosamax [alendronate], and sulfa antibiotics.  MEDICATIONS:  Current Outpatient Medications  Medication Sig Dispense Refill   albuterol  (PROVENTIL  HFA;VENTOLIN  HFA) 108 (90 Base) MCG/ACT inhaler Inhale 2 puffs into the lungs every 6 (six) hours as needed for wheezing or shortness of breath. 3 Inhaler 0   calcium carbonate (OSCAL) 1500 (600 Ca) MG TABS tablet Take 600 mg of elemental calcium by mouth daily with breakfast.     Cholecalciferol (VITAMIN D3) 50 MCG (2000 UT) capsule Take 2,000 Units by mouth in the morning.     citalopram (CELEXA) 40 MG tablet Take 40 mg by mouth at bedtime.     clonazePAM (KLONOPIN) 0.5 MG tablet Take 0.5 mg by mouth daily as needed.     cyanocobalamin  (VITAMIN B12) 1000 MCG/ML injection Inject into the muscle.     dicyclomine  (BENTYL ) 10 MG capsule Take 1 capsule (10 mg total) by mouth 2 (two) times daily as needed for spasms (loose stools). 60 capsule 0   FEROSUL 325 (65 Fe) MG tablet Take 325 mg by mouth every other day.     metoprolol  succinate (TOPROL -XL) 25 MG 24 hr tablet Take 12.5 mg by mouth in the morning.     naproxen sodium (ALEVE) 220 MG tablet Take 220 mg by mouth 2 (two) times daily as needed (pain.).     nitroGLYCERIN  (NITROSTAT ) 0.4 MG SL tablet Place 0.4 mg under the tongue every 5 (five) minutes x 3 doses as needed for chest pain.     pantoprazole  (PROTONIX ) 40 MG tablet Take 1 tablet by mouth once daily 30 tablet 11   rosuvastatin (CRESTOR) 5 MG tablet Take 5 mg by mouth at bedtime.     No current facility-administered medications for this visit.     REVIEW OF SYSTEMS:   Constitutional: Denies fevers, chills  Eyes: Denies blurriness of vision Ears, nose, mouth, throat, and face: Denies mucositis or sore throat Respiratory: Denies cough, dyspnea or wheezes Cardiovascular: Denies  palpitation, chest discomfort or lower extremity swelling Gastrointestinal:  Denies nausea, heartburn or change in bowel habits Skin: Denies abnormal skin rashes Lymphatics: Denies new lymphadenopathy or easy bruising Neurological:Denies numbness, tingling or new weaknesses Behavioral/Psych: Mood is stable, no new changes  All other systems were reviewed with the patient and are negative.  PHYSICAL EXAMINATION:   Vitals:   02/17/24 1324  BP: 127/76  Pulse: 92  Resp: 18  Temp: (!) 97.1 F (36.2 Villanueva)  SpO2: 100%     GENERAL:alert, no distress and comfortable LYMPH:  no palpable lymphadenopathy in the cervical, axillary or inguinal LUNGS: clear to auscultation and percussion with normal breathing effort HEART: regular rate & rhythm and no murmurs and no lower extremity edema ABDOMEN:abdomen soft, non-tender and normal bowel sounds.  Restricted due to abdominal fat.  Could not palpate spleen. Musculoskeletal:no cyanosis of digits and no clubbing  NEURO: alert & oriented x 3 with fluent speech  LABORATORY DATA:  I have reviewed the data as listed  Lab Results  Component Value Date   WBC 2.8 (L) 02/10/2024   NEUTROABS 1.9 02/10/2024   HGB 12.9 02/10/2024   HCT 40.5 02/10/2024   MCV 83.5 02/10/2024   PLT 77 (L) 02/10/2024      Component Value Date/Time   NA 137 02/10/2024 1346   NA 140 11/16/2023 1405   K 4.3 02/10/2024 1346   CL 99 02/10/2024 1346   CO2 28 02/10/2024 1346   GLUCOSE 147 (H) 02/10/2024 1346   BUN 11 02/10/2024 1346   BUN 9 11/16/2023 1405   CREATININE 0.81 02/10/2024 1346   CREATININE 0.73 07/15/2021 1302   CALCIUM 9.5 02/10/2024 1346   PROT 6.7 02/10/2024 1346   PROT 6.2 11/16/2023 1405   ALBUMIN 4.5 02/10/2024 1346   ALBUMIN 4.4 11/16/2023 1405   AST 45 (H) 02/10/2024 1346   ALT 21 02/10/2024 1346   ALKPHOS 56 02/10/2024 1346   BILITOT 0.5 02/10/2024 1346   BILITOT 0.4 11/16/2023 1405   GFRNONAA >60 02/10/2024 1346   GFRNONAA >89 04/18/2015  1057   GFRAA >60 11/18/2017 1102   GFRAA >89 04/18/2015 1057       Chemistry      Component Value Date/Time   NA 137 02/10/2024 1346   NA 140 11/16/2023 1405   K 4.3 02/10/2024 1346   CL 99 02/10/2024 1346   CO2 28 02/10/2024 1346   BUN 11 02/10/2024 1346   BUN 9 11/16/2023 1405   CREATININE 0.81 02/10/2024 1346   CREATININE 0.73 07/15/2021 1302      Component Value Date/Time   CALCIUM 9.5 02/10/2024 1346   ALKPHOS 56 02/10/2024 1346   AST 45 (H) 02/10/2024 1346   ALT 21 02/10/2024 1346   BILITOT 0.5 02/10/2024 1346   BILITOT 0.4 11/16/2023 1405      Latest Reference Range & Units 02/10/24 13:46  Iron 28 - 170 ug/dL 44  UIBC ug/dL 652  TIBC 749 - 549 ug/dL 608  Saturation Ratios 10.4 - 31.8 % 11  Ferritin 11 - 307 ng/mL 92  Folate >5.9 ng/mL 5.7 (L)  Vitamin B12 180 - 914 pg/mL 636  (L): Data is abnormally low   Latest Reference Range & Units 02/10/24 13:47  Total Protein ELP 6.0 - 8.5 g/dL 6.7 (Villanueva)  Albumin SerPl Elph-Mcnc 2.9 - 4.4 g/dL 3.9 (Villanueva)  Albumin/Glob SerPl 0.7 - 1.7  1.4 (Villanueva)  Alpha2 Glob SerPl Elph-Mcnc 0.4 - 1.0 g/dL 0.7 (Villanueva)  Alpha 1 0.0 - 0.4 g/dL 0.3 (Villanueva)  Gamma Glob SerPl Elph-Mcnc 0.4 - 1.8 g/dL 0.8 (Villanueva)  M Protein SerPl Elph-Mcnc Not Observed g/dL 0.2 (H) (Villanueva)  IFE 1  Comment ! (Villanueva)  Globulin, Total 2.2 - 3.9 g/dL 2.8 (Villanueva)  B-Globulin SerPl Elph-Mcnc 0.7 - 1.3 g/dL 1.0 (Villanueva)  IgG (Immunoglobin G), Serum 586 - 1,602 mg/dL 427 (L)  IgM (Immunoglobulin M), Srm 26 - 217 mg/dL 709 (H)  IgA 87 - 647 mg/dL 44 (L)  (H): Data is abnormally high !: Data is abnormal (L): Data is abnormally low (Villanueva): Corrected   Latest Reference Range & Units 02/10/24 13:46  Kappa free light chain 3.3 - 19.4 mg/L 28.3 (H)  Lambda free light chains 5.7 - 26.3 mg/L 26.2  Kappa, lambda light chain ratio 0.26 - 1.65  1.08  (H): Data is abnormally high  Pathology: Bone marrow biopsy:  DIAGNOSIS:  Bone marrow, aspirate, flow cytometry: -  1% CD34 positive blasts, not  increased. -  No definitive immunophenotypic evidence of a lymphoproliferative disorder.  Note: It is noted that there is a previous history of a kappa clonal population the peripheral blood.  The bone marrow flow cytometry does show a mild kappa predominance; however, this is on a low level of overall B cells and the pattern is somewhat artifactual in nature.  A definitive clonal population of B cells is not detected

## 2024-03-05 ENCOUNTER — Other Ambulatory Visit: Payer: Self-pay | Admitting: Oncology

## 2024-04-18 ENCOUNTER — Encounter: Payer: Self-pay | Admitting: *Deleted

## 2024-04-25 ENCOUNTER — Ambulatory Visit (HOSPITAL_COMMUNITY): Admitting: Registered Nurse

## 2024-06-08 ENCOUNTER — Ambulatory Visit: Admitting: Gastroenterology

## 2024-08-17 ENCOUNTER — Inpatient Hospital Stay

## 2024-08-24 ENCOUNTER — Inpatient Hospital Stay: Admitting: Oncology
